# Patient Record
Sex: Female | Born: 1985 | Race: Black or African American | Hispanic: No | Marital: Married | Smoking: Former smoker
Health system: Southern US, Community
[De-identification: ages and names within clinical notes are randomized; demographics above are authoritative.]

## PROBLEM LIST (undated history)

## (undated) ENCOUNTER — Inpatient Hospital Stay (HOSPITAL_COMMUNITY): Payer: Self-pay

## (undated) DIAGNOSIS — Z789 Other specified health status: Secondary | ICD-10-CM

## (undated) DIAGNOSIS — O139 Gestational [pregnancy-induced] hypertension without significant proteinuria, unspecified trimester: Secondary | ICD-10-CM

## (undated) DIAGNOSIS — Z8719 Personal history of other diseases of the digestive system: Secondary | ICD-10-CM

## (undated) DIAGNOSIS — B999 Unspecified infectious disease: Secondary | ICD-10-CM

## (undated) DIAGNOSIS — O24419 Gestational diabetes mellitus in pregnancy, unspecified control: Secondary | ICD-10-CM

## (undated) DIAGNOSIS — R81 Glycosuria: Secondary | ICD-10-CM

## (undated) DIAGNOSIS — D649 Anemia, unspecified: Secondary | ICD-10-CM

## (undated) DIAGNOSIS — G629 Polyneuropathy, unspecified: Secondary | ICD-10-CM

## (undated) DIAGNOSIS — E119 Type 2 diabetes mellitus without complications: Secondary | ICD-10-CM

## (undated) HISTORY — DX: Gestational (pregnancy-induced) hypertension without significant proteinuria, unspecified trimester: O13.9

## (undated) HISTORY — DX: Polyneuropathy, unspecified: G62.9

## (undated) HISTORY — PX: NO PAST SURGERIES: SHX2092

## (undated) HISTORY — DX: Personal history of other diseases of the digestive system: Z87.19

---
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
## 2011-01-31 ENCOUNTER — Emergency Department (HOSPITAL_COMMUNITY): Payer: Self-pay

## 2011-01-31 ENCOUNTER — Emergency Department (HOSPITAL_COMMUNITY)
Admission: EM | Admit: 2011-01-31 | Discharge: 2011-01-31 | Disposition: A | Discharge status: Home / Self Care | Payer: Self-pay | Attending: Emergency Medicine | Admitting: Emergency Medicine

## 2011-01-31 DIAGNOSIS — R062 Wheezing: Secondary | ICD-10-CM | POA: Insufficient documentation

## 2011-01-31 DIAGNOSIS — R490 Dysphonia: Secondary | ICD-10-CM | POA: Insufficient documentation

## 2011-01-31 DIAGNOSIS — J40 Bronchitis, not specified as acute or chronic: Secondary | ICD-10-CM | POA: Insufficient documentation

## 2011-01-31 DIAGNOSIS — R0789 Other chest pain: Secondary | ICD-10-CM | POA: Insufficient documentation

## 2011-01-31 DIAGNOSIS — R070 Pain in throat: Secondary | ICD-10-CM | POA: Insufficient documentation

## 2011-01-31 DIAGNOSIS — R05 Cough: Secondary | ICD-10-CM | POA: Insufficient documentation

## 2011-01-31 DIAGNOSIS — F17200 Nicotine dependence, unspecified, uncomplicated: Secondary | ICD-10-CM | POA: Insufficient documentation

## 2011-01-31 DIAGNOSIS — R059 Cough, unspecified: Secondary | ICD-10-CM | POA: Insufficient documentation

## 2011-02-10 ENCOUNTER — Emergency Department (HOSPITAL_COMMUNITY)
Admission: EM | Admit: 2011-02-10 | Discharge: 2011-02-11 | Disposition: A | Discharge status: Home / Self Care | Payer: Self-pay | Attending: Emergency Medicine | Admitting: Emergency Medicine

## 2011-02-10 DIAGNOSIS — L2989 Other pruritus: Secondary | ICD-10-CM | POA: Insufficient documentation

## 2011-02-10 DIAGNOSIS — B86 Scabies: Secondary | ICD-10-CM | POA: Insufficient documentation

## 2011-02-10 DIAGNOSIS — R21 Rash and other nonspecific skin eruption: Secondary | ICD-10-CM | POA: Insufficient documentation

## 2011-02-10 DIAGNOSIS — L298 Other pruritus: Secondary | ICD-10-CM | POA: Insufficient documentation

## 2011-06-20 ENCOUNTER — Emergency Department (HOSPITAL_COMMUNITY)
Admission: EM | Admit: 2011-06-20 | Discharge: 2011-06-20 | Disposition: A | Discharge status: Home / Self Care | Payer: Self-pay | Attending: Emergency Medicine | Admitting: Emergency Medicine

## 2011-06-20 DIAGNOSIS — H53149 Visual discomfort, unspecified: Secondary | ICD-10-CM | POA: Insufficient documentation

## 2011-06-20 DIAGNOSIS — R51 Headache: Secondary | ICD-10-CM | POA: Insufficient documentation

## 2011-07-05 ENCOUNTER — Ambulatory Visit (INDEPENDENT_AMBULATORY_CARE_PROVIDER_SITE_OTHER): Payer: Self-pay

## 2011-07-05 ENCOUNTER — Inpatient Hospital Stay (INDEPENDENT_AMBULATORY_CARE_PROVIDER_SITE_OTHER)
Admission: RE | Admit: 2011-07-05 | Discharge: 2011-07-05 | Disposition: A | Discharge status: Home / Self Care | Payer: Self-pay | Source: Ambulatory Visit | Attending: Family Medicine | Admitting: Family Medicine

## 2011-07-05 DIAGNOSIS — M940 Chondrocostal junction syndrome [Tietze]: Secondary | ICD-10-CM

## 2011-09-16 ENCOUNTER — Other Ambulatory Visit: Payer: Self-pay

## 2011-09-16 ENCOUNTER — Encounter (HOSPITAL_COMMUNITY): Payer: Self-pay | Admitting: Emergency Medicine

## 2011-09-16 ENCOUNTER — Emergency Department (HOSPITAL_COMMUNITY)
Admission: EM | Admit: 2011-09-16 | Discharge: 2011-09-16 | Disposition: A | Discharge status: Home / Self Care | Payer: Self-pay | Attending: Emergency Medicine | Admitting: Emergency Medicine

## 2011-09-16 DIAGNOSIS — R42 Dizziness and giddiness: Secondary | ICD-10-CM | POA: Insufficient documentation

## 2011-09-16 DIAGNOSIS — R232 Flushing: Secondary | ICD-10-CM | POA: Insufficient documentation

## 2011-09-16 DIAGNOSIS — R5381 Other malaise: Secondary | ICD-10-CM | POA: Insufficient documentation

## 2011-09-16 DIAGNOSIS — F17200 Nicotine dependence, unspecified, uncomplicated: Secondary | ICD-10-CM | POA: Insufficient documentation

## 2011-09-16 DIAGNOSIS — R55 Syncope and collapse: Secondary | ICD-10-CM

## 2011-09-16 LAB — BASIC METABOLIC PANEL
Calcium: 9.5 mg/dL (ref 8.4–10.5)
Creatinine, Ser: 0.71 mg/dL (ref 0.50–1.10)
GFR calc non Af Amer: >90 mL/min (ref >90)
Glucose, Bld: 116 mg/dL — ABNORMAL HIGH (ref 70–99)
Sodium: 135 mEq/L (ref 135–145)

## 2011-09-16 LAB — BASIC METABOLIC PANEL WITH GFR
BUN: 11 mg/dL (ref 6–23)
CO2: 23 meq/L (ref 19–32)
Chloride: 101 meq/L (ref 96–112)
GFR calc Af Amer: >90 mL/min (ref >90)
Potassium: 3.5 meq/L (ref 3.5–5.1)

## 2011-09-16 LAB — CBC
HCT: 36.1 % (ref 36.0–46.0)
Hemoglobin: 12.1 g/dL (ref 12.0–15.0)
MCH: 26.5 pg (ref 26.0–34.0)
MCHC: 33.5 g/dL (ref 30.0–36.0)
MCV: 79 fL (ref 78.0–100.0)
Platelets: 236 10*3/uL (ref 150–400)
RBC: 4.57 MIL/uL (ref 3.87–5.11)
RDW: 13.6 % (ref 11.5–15.5)
WBC: 6.1 K/uL (ref 4.0–10.5)

## 2011-09-16 LAB — DIFFERENTIAL
Basophils Absolute: 0 10*3/uL (ref 0.0–0.1)
Basophils Relative: 1 % (ref 0–1)
Eosinophils Absolute: 0.1 10*3/uL (ref 0.0–0.7)
Eosinophils Relative: 1 % (ref 0–5)
Lymphocytes Relative: 38 % (ref 12–46)
Lymphs Abs: 2.3 10*3/uL (ref 0.7–4.0)
Monocytes Absolute: 0.6 K/uL (ref 0.1–1.0)
Monocytes Relative: 9 % (ref 3–12)
Neutro Abs: 3.1 K/uL (ref 1.7–7.7)
Neutrophils Relative %: 51 % (ref 43–77)

## 2011-09-16 LAB — POCT PREGNANCY, URINE: Preg Test, Ur: NEGATIVE

## 2011-09-16 NOTE — ED Provider Notes (Signed)
History     CSN: [id number]  Arrival date & time 09/16/11  1543   First MD Initiated Contact with Patient 09/16/11 1754      Chief Complaint  Patient presents with  . Dizziness    (Consider location/radiation/quality/duration/timing/severity/associated sxs/prior treatment) HPI Comments: Patient reports that she was with 2 of her friends had been drinking beer last night and around 1 PM she felt somewhat dizzy he felt flushed and hot. She reports that she wanted to go outside to get some fresh air and after standing and taking one or 2 steps she lost consciousness and fell to the ground. She reports she was not unconscious for a long period of time, no oral trauma and no seizure-like activity. She did not have any preceding shortness of breath or chest pain. She did not have any back pain or abdominal pain. Since then today, she has not had any episodes of chest pain, palpitations or shortness of breath. She has felt somewhat fatigued and tired and reports some mild dizziness. Dizziness does not imply vertigo or spinning sensation according to the patient. She reports no family history of sudden death, fainting or cardiac arrhythmias. She reports that today she is simply rest and drink lots of fluid. She comes here for further evaluation.  The history is provided by the patient.    History reviewed. No pertinent past medical history.  History reviewed. No pertinent past surgical history.  History reviewed. No pertinent family history.  History  Substance Use Topics  . Smoking status: Current Everyday Smoker  . Smokeless tobacco: Not on file  . Alcohol Use: No    OB History    Grav Para Term Preterm Abortions TAB SAB Ect Mult Living                  Review of Systems  Constitutional: Negative.   Respiratory: Negative for cough, chest tightness and shortness of breath.   Cardiovascular: Negative for chest pain and leg swelling.  Gastrointestinal: Negative for abdominal pain,  diarrhea and blood in stool.  Musculoskeletal: Negative for back pain.  Neurological: Positive for dizziness and light-headedness. Negative for weakness, numbness and headaches.    Allergies  Review of patient's allergies indicates no known allergies.  Home Medications  No current outpatient prescriptions on file.  BP 108/70  Pulse 60  Temp(Src) 98.4 F (36.9 C) (Oral)  Resp 16  SpO2 100%  LMP 08/15/2011  Physical Exam  Nursing note and vitals reviewed. Constitutional: She appears well-developed and well-nourished.  HENT:  Head: Normocephalic and atraumatic.  Mouth/Throat: Mucous membranes are normal. Mucous membranes are not pale and not dry.  Eyes: Conjunctivae are normal. Pupils are equal, round, and reactive to light. No scleral icterus.  Neck: Normal range of motion. Neck supple.  Cardiovascular: Normal rate and regular rhythm.   Pulmonary/Chest: Effort normal. No respiratory distress.  Abdominal: Soft.  Skin: Skin is warm and dry. No rash noted.    ED Course  Procedures (including critical care time)  Labs Reviewed  BASIC METABOLIC PANEL - Abnormal; Notable for the following:    Glucose, Bld 116 (*)    All other components within normal limits  CBC  DIFFERENTIAL  POCT PREGNANCY, URINE  POCT PREGNANCY, URINE   No results found.   1. Vasovagal syncope     EKG at time 19:18 shows a normal sinus rhythm at rate of 61. Normal intervals, normal axis and no ST or T wave abnormalities are seen. ECG is similar  in appearance to one performed on 01/31/2011.  MDM  Vital signs are normal with room air saturations being 100% which is normal. Patient's examination here is unremarkable. There no family risk factors to suggest sudden cardiac event. Her last menstrual cycle was approximately one month ago. Plan is to obtain basic blood count, urine pregnancy and an EKG. Otherwise clinically and by history, I feel that the patient simply had a vasovagal  event.       Gavin Pound. Oletta Lamas, MD 09/16/11 5621

## 2011-09-16 NOTE — ED Notes (Signed)
PT. REPORTS DIZZINESS AND FAINTED LAST NIGHT AT HOME ,  STATES GENERALIZED WEAKNESS AND HEADACHE .

## 2011-09-16 NOTE — Discharge Instructions (Signed)
Syncope You have had a fainting (syncopal) spell. A fainting episode is a sudden, short-lived loss of consciousness. It results in complete recovery. It occurs because there has been a temporary shortage of oxygen and/or sugar (glucose) to the brain. CAUSES   Blood pressure pills and other medications that may lower blood pressure below normal. Sudden changes in posture (sudden standing).   Over-medication. Take your medications as directed.   Standing too long. This can cause blood to pool in the legs.   Seizure disorders.   Low blood sugar (hypoglycemia) of diabetes. This more commonly causes coma.   Bearing down to go to the bathroom. This can cause your blood pressure to rise suddenly. Your body compensates by making the blood pressure too low when you stop bearing down.   Hardening of the arteries where the brain temporarily does not receive enough blood.   Irregular heart beat and circulatory problems.   Fear, emotional distress, injury, sight of blood, or illness.  Your caregiver will send you home if the syncope was from non-worrisome causes (benign). Depending on your age and health, you may stay to be monitored and observed. If you return home, have someone stay with you if your caregiver feels that is desirable. It is very important to keep all follow-up referrals and appointments in order to properly manage this condition. This is a serious problem which can lead to serious illness and death if not carefully managed.  WARNING: Do not drive or operate machinery until your caregiver feels that it is safe for you to do so. SEEK IMMEDIATE MEDICAL CARE IF:   You have another fainting episode or faint while lying or sitting down. DO NOT DRIVE YOURSELF. Call 911 if no other help is available.   You have chest pain, are feeling sick to your stomach (nausea), vomiting or abdominal pain.   You have an irregular heartbeat or one that is very fast (pulse over 120 beats per minute).    You have a loss of feeling in some part of your body or lose movement in your arms or legs.   You have difficulty with speech, confusion, severe weakness, or visual problems.   You become sweaty and/or feel light headed.  Make sure you are rechecked as instructed. Document Released: 08/19/2005 Document Revised: 05/01/2011 Document Reviewed: 04/09/2007 [employer] Patient Information 2012 [employer]. 

## 2012-04-01 ENCOUNTER — Emergency Department (HOSPITAL_COMMUNITY): Payer: Self-pay

## 2012-04-01 ENCOUNTER — Emergency Department (HOSPITAL_COMMUNITY)
Admission: EM | Admit: 2012-04-01 | Discharge: 2012-04-01 | Disposition: A | Discharge status: Home / Self Care | Payer: Self-pay | Attending: Emergency Medicine | Admitting: Emergency Medicine

## 2012-04-01 ENCOUNTER — Encounter (HOSPITAL_COMMUNITY): Payer: Self-pay | Admitting: *Deleted

## 2012-04-01 DIAGNOSIS — R0602 Shortness of breath: Secondary | ICD-10-CM | POA: Insufficient documentation

## 2012-04-01 DIAGNOSIS — R071 Chest pain on breathing: Secondary | ICD-10-CM | POA: Insufficient documentation

## 2012-04-01 DIAGNOSIS — R079 Chest pain, unspecified: Secondary | ICD-10-CM | POA: Insufficient documentation

## 2012-04-01 LAB — CBC WITH DIFFERENTIAL/PLATELET
Basophils Absolute: 0 10*3/uL (ref 0.0–0.1)
Basophils Relative: 0 % (ref 0–1)
Eosinophils Absolute: 0.1 10*3/uL (ref 0.0–0.7)
Eosinophils Relative: 1 % (ref 0–5)
Lymphocytes Relative: 32 % (ref 12–46)
MCHC: 32.1 g/dL (ref 30.0–36.0)
MCV: 81.5 fL (ref 78.0–100.0)
Platelets: 238 10*3/uL (ref 150–400)
RDW: 13.8 % (ref 11.5–15.5)
WBC: 6.8 10*3/uL (ref 4.0–10.5)

## 2012-04-01 LAB — BASIC METABOLIC PANEL
CO2: 25 mEq/L (ref 19–32)
Calcium: 10.2 mg/dL (ref 8.4–10.5)
Creatinine, Ser: 0.65 mg/dL (ref 0.50–1.10)
GFR calc Af Amer: >90 mL/min (ref >90)
GFR calc non Af Amer: >90 mL/min (ref >90)
Sodium: 137 mEq/L (ref 135–145)

## 2012-04-01 LAB — PREGNANCY, URINE: Preg Test, Ur: NEGATIVE

## 2012-04-01 LAB — TROPONIN I: Troponin I: <0.3 ng/mL (ref <0.30)

## 2012-04-01 MED ORDER — ALBUTEROL SULFATE (5 MG/ML) 0.5% IN NEBU
5.0000 mg | INHALATION_SOLUTION | RESPIRATORY_TRACT | Status: AC
  Administered 2012-04-01: 5 mg via RESPIRATORY_TRACT
  Filled 2012-04-01: qty 1

## 2012-04-01 MED ORDER — IPRATROPIUM BROMIDE 0.02 % IN SOLN
0.5000 mg | Freq: Once | RESPIRATORY_TRACT | Status: AC
  Administered 2012-04-01: 0.5 mg via RESPIRATORY_TRACT
  Filled 2012-04-01: qty 2.5

## 2012-04-01 MED ORDER — SODIUM CHLORIDE 0.9 % IV BOLUS (SEPSIS)
1000.0000 mL | Freq: Once | INTRAVENOUS | Status: AC
  Administered 2012-04-01: 1000 mL via INTRAVENOUS

## 2012-04-01 MED ORDER — IBUPROFEN 400 MG PO TABS: 400.0000 mg | ORAL_TABLET | Freq: Four times a day (QID) | ORAL | Status: AC | PRN

## 2012-04-01 MED ORDER — KETOROLAC TROMETHAMINE 30 MG/ML IJ SOLN
30.0000 mg | Freq: Once | INTRAMUSCULAR | Status: AC
  Administered 2012-04-01: 30 mg via INTRAVENOUS
  Filled 2012-04-01: qty 1

## 2012-04-01 NOTE — ED Notes (Signed)
Reports mid chest pain that started this am, denies injury, reports mild cough today. No sob. No acute distress noted at triage, ekg being done.

## 2012-04-01 NOTE — ED Provider Notes (Addendum)
History     CSN: [id number]  Arrival date & time 04/01/12  1620   First MD Initiated Contact with Patient 04/01/12 1810      Chief Complaint  Patient presents with  . Chest Pain    (Consider location/radiation/quality/duration/timing/severity/associated sxs/prior treatment) HPI Comments: Pt with hx of bronchitis comes in with cc of chest pain. Pt states that she woke up with a mid-sternal chest pain that is constant, and worse with inspiration. The pain is severe, and there is associated cough that is non productive. Pt has no n/v/f/c. She denies any worsening of pain with exertion. Pt denies any illicits use, but does smoke cocaine. LMP was 2 days ago.  Patient is a 26 y.o. female presenting with chest pain. The history is provided by the patient.  Chest Pain Primary symptoms include shortness of breath. Pertinent negatives for primary symptoms include no abdominal pain, no nausea and no vomiting.     History reviewed. No pertinent past medical history.  History reviewed. No pertinent past surgical history.  History reviewed. No pertinent family history.  History  Substance Use Topics  . Smoking status: Current Everyday Smoker  . Smokeless tobacco: Not on file  . Alcohol Use: No    OB History    Grav Para Term Preterm Abortions TAB SAB Ect Mult Living                  Review of Systems  Constitutional: Positive for activity change.  HENT: Negative for neck pain.   Respiratory: Positive for shortness of breath.   Cardiovascular: Positive for chest pain.  Gastrointestinal: Negative for nausea, vomiting and abdominal pain.  Genitourinary: Negative for dysuria.  Neurological: Negative for headaches.    Allergies  Review of patient's allergies indicates no known allergies.  Home Medications   Current Outpatient Rx  Name Route Sig Dispense Refill  . IBUPROFEN 200 MG PO TABS Oral Take 400 mg by mouth every 6 (six) hours as needed. For pain      BP 120/73   Pulse 79  Temp 98.3 F (36.8 C) (Oral)  Resp 18  SpO2 100%  LMP 03/25/2012  Physical Exam  Constitutional: She is oriented to person, place, and time. She appears well-developed and well-nourished.  HENT:  Head: Normocephalic and atraumatic.  Eyes: EOM are normal. Pupils are equal, round, and reactive to light.  Neck: Neck supple.  Cardiovascular: Normal rate, regular rhythm and normal heart sounds.   No murmur heard. Pulmonary/Chest: Effort normal and breath sounds normal. No respiratory distress. She has no wheezes. She has no rales. She exhibits tenderness.  Abdominal: Soft. She exhibits no distension. There is no tenderness. There is no rebound and no guarding.  Neurological: She is alert and oriented to person, place, and time.  Skin: Skin is warm and dry.    ED Course  Procedures (including critical care time)   Labs Reviewed  CBC WITH DIFFERENTIAL  BASIC METABOLIC PANEL  TROPONIN I  PREGNANCY, URINE   Dg Chest 2 View  04/01/2012  *RADIOLOGY REPORT*  Clinical Data: Chest pain  CHEST - 2 VIEW  Comparison:  07/05/2011  Findings:  The heart size and mediastinal contours are within normal limits.  Both lungs are clear.  The visualized skeletal structures are unremarkable.  IMPRESSION: No active cardiopulmonary disease.  Original Report Authenticated By: Judie Petit. Ruel Favors, M.D.     No diagnosis found.    MDM   Date: 04/01/2012  Rate: 77  Rhythm: normal sinus  rhythm  QRS Axis: normal  Intervals: normal  ST/T Wave abnormalities: nonspecific T wave changes  Conduction Disutrbances:none  Narrative Interpretation:   Old EKG Reviewed: unchanged  Differential diagnosis includes: ACS syndrome Valvular disorder Myocarditis Pericarditis Pericardial effusion Pneumonia/bronchitis Pleural effusion Pulmonary edema PE Anemia Musculoskeletal pain  A/P: Pt with no cardiac hx, and only risk factor being smoking comes in with cc of pleuritic chest pain that started this  am. She has hx of bronchitis, but the lung exam is largely normal. Pt's EKG is unchanged and CXR is WNL. Sincethe pain is pleuritic, there is associated sob and cough - we will get d-dimer for this patient. Her WELLS score for PE is negative, and she would have been PERCs negative as well.   10:18 PM All labs normal, CXR normal, no abnormal lung exam, so we will not treat as bronchitis. Likely chest wall pain.      Derwood Kaplan, MD 04/01/12 1901  Derwood Kaplan, MD 04/01/12 2218

## 2012-04-28 ENCOUNTER — Emergency Department (HOSPITAL_COMMUNITY)
Admission: EM | Admit: 2012-04-28 | Discharge: 2012-04-28 | Disposition: A | Discharge status: Home / Self Care | Payer: Self-pay | Attending: Emergency Medicine | Admitting: Emergency Medicine

## 2012-04-28 ENCOUNTER — Encounter (HOSPITAL_COMMUNITY): Payer: Self-pay | Admitting: Emergency Medicine

## 2012-04-28 DIAGNOSIS — R209 Unspecified disturbances of skin sensation: Secondary | ICD-10-CM | POA: Insufficient documentation

## 2012-04-28 DIAGNOSIS — F17200 Nicotine dependence, unspecified, uncomplicated: Secondary | ICD-10-CM | POA: Insufficient documentation

## 2012-04-28 DIAGNOSIS — R200 Anesthesia of skin: Secondary | ICD-10-CM

## 2012-04-28 HISTORY — DX: Glycosuria: R81

## 2012-04-28 MED ORDER — GABAPENTIN 100 MG PO CAPS: 100.0000 mg | ORAL_CAPSULE | Freq: Three times a day (TID) | ORAL | Status: DC

## 2012-04-28 NOTE — ED Notes (Signed)
Two days ago top of right leg started going numb,  Started as a little patch on front of leg, now is entire anterior portion of thigh. No pain, just numb

## 2012-04-28 NOTE — ED Provider Notes (Signed)
History     CSN: [id number]  Arrival date & time 04/28/12  1533   First MD Initiated Contact with Patient 04/28/12 1652      Chief Complaint  Patient presents with  . leg numb     (Consider location/radiation/quality/duration/timing/severity/associated sxs/prior treatment) HPI Comments: Pt w no significant PMHx presents to the ER for right thigh numbness x 2 days. Pt denies any back pain, loss control of bowel or bladder, trauma/injury, Fever. NS, Chills, rash, insect bite, HA, myalgias or nuchal rigidity. There is no pain, weakness, inability to ambulate or footdrop. No hx of CA.   The history is provided by the patient.    Past Medical History  Diagnosis Date  . Glucose found in urine on examination     No past surgical history on file.  No family history on file.  History  Substance Use Topics  . Smoking status: Current Everyday Smoker -- 0.5 packs/day    Types: Cigarettes  . Smokeless tobacco: Not on file  . Alcohol Use: Yes     occasionally    OB History    Grav Para Term Preterm Abortions TAB SAB Ect Mult Living                  Review of Systems  Constitutional: Negative for fever, chills and appetite change.  HENT: Negative for congestion.   Eyes: Negative for visual disturbance.  Respiratory: Negative for shortness of breath.   Cardiovascular: Negative for chest pain and leg swelling.  Gastrointestinal: Negative for abdominal pain.  Genitourinary: Negative for dysuria, urgency and frequency.  Neurological: Positive for numbness. Negative for dizziness, tremors, seizures, syncope, speech difficulty, weakness, light-headedness and headaches.  Psychiatric/Behavioral: Negative for confusion.  All other systems reviewed and are negative.    Allergies  Review of patient's allergies indicates no known allergies.  Home Medications   Current Outpatient Rx  Name Route Sig Dispense Refill  . IBUPROFEN 200 MG PO TABS Oral Take 800 mg by mouth every 8  (eight) hours as needed. For pain      BP 119/74  Pulse 67  Temp 98.6 F (37 C) (Oral)  Resp 16  SpO2 100%  LMP 04/20/2012  Physical Exam  Nursing note and vitals reviewed. Constitutional: She is oriented to person, place, and time. She appears well-developed and well-nourished. No distress.  HENT:  Head: Normocephalic and atraumatic.  Eyes: Conjunctivae and EOM are normal.  Neck: Normal range of motion.  Pulmonary/Chest: Effort normal.  Musculoskeletal: Normal range of motion.  Neurological: She is alert and oriented to person, place, and time.       Decreased light touch sensation of right lateral and anterior thigh along L3-L4 dermatomes. Intact strength, hot/cold & sharp dull sensation.   Skin: Skin is warm and dry. No rash noted. She is not diaphoretic.       Skin intact, no rash or erythema  Psychiatric: She has a normal mood and affect. Her behavior is normal.    ED Course  Procedures (including critical care time)  Labs Reviewed - No data to display No results found.   No diagnosis found.    MDM  Pt is young health 26 yo F that presents w thigh numbness x 2 days. Discussed w [facility] who agrees to [employer] w plan to f-u w PCP and return if symptoms worsen. Non-concerning pain free PE w no weakness of lower extremities. Return precautions discussed.        Jaci Carrel, [place] 04/28/12  1751 

## 2012-04-29 NOTE — ED Provider Notes (Signed)
Medical screening examination/treatment/procedure(s) were performed by non-physician practitioner and as supervising physician I was immediately available for consultation/collaboration.  Tanzania Basham R. Rebel Laughridge, MD 04/29/12 0007 

## 2012-05-07 ENCOUNTER — Encounter (HOSPITAL_COMMUNITY): Payer: Self-pay | Admitting: *Deleted

## 2012-05-07 ENCOUNTER — Emergency Department (HOSPITAL_COMMUNITY)
Admission: EM | Admit: 2012-05-07 | Discharge: 2012-05-07 | Disposition: A | Discharge status: Home / Self Care | Payer: Self-pay | Attending: Emergency Medicine | Admitting: Emergency Medicine

## 2012-05-07 DIAGNOSIS — R112 Nausea with vomiting, unspecified: Secondary | ICD-10-CM | POA: Insufficient documentation

## 2012-05-07 DIAGNOSIS — F17200 Nicotine dependence, unspecified, uncomplicated: Secondary | ICD-10-CM | POA: Insufficient documentation

## 2012-05-07 LAB — CBC WITH DIFFERENTIAL/PLATELET
Basophils Absolute: 0.1 10*3/uL (ref 0.0–0.1)
Basophils Relative: 1 % (ref 0–1)
Eosinophils Relative: 1 % (ref 0–5)
HCT: 34.8 % — ABNORMAL LOW (ref 36.0–46.0)
Hemoglobin: 11.5 g/dL — ABNORMAL LOW (ref 12.0–15.0)
MCHC: 33 g/dL (ref 30.0–36.0)
MCV: 80.2 fL (ref 78.0–100.0)
Monocytes Absolute: 0.4 10*3/uL (ref 0.1–1.0)
Monocytes Relative: 7 % (ref 3–12)
RDW: 13.8 % (ref 11.5–15.5)

## 2012-05-07 LAB — COMPREHENSIVE METABOLIC PANEL
AST: 18 U/L (ref 0–37)
Albumin: 4.2 g/dL (ref 3.5–5.2)
BUN: 11 mg/dL (ref 6–23)
CO2: 25 mEq/L (ref 19–32)
Calcium: 9.7 mg/dL (ref 8.4–10.5)
Creatinine, Ser: 0.69 mg/dL (ref 0.50–1.10)
GFR calc non Af Amer: >90 mL/min (ref >90)
Total Bilirubin: 0.6 mg/dL (ref 0.3–1.2)

## 2012-05-07 LAB — URINALYSIS, ROUTINE W REFLEX MICROSCOPIC
Hgb urine dipstick: NEGATIVE
Leukocytes, UA: NEGATIVE
Protein, ur: NEGATIVE mg/dL
Specific Gravity, Urine: 1.026 (ref 1.005–1.030)
Urobilinogen, UA: 1 mg/dL (ref 0.0–1.0)

## 2012-05-07 MED ORDER — ONDANSETRON HCL 8 MG PO TABS: 8.0000 mg | ORAL_TABLET | Freq: Three times a day (TID) | ORAL | Status: AC | PRN

## 2012-05-07 MED ORDER — ONDANSETRON 8 MG PO TBDP
8.0000 mg | ORAL_TABLET | Freq: Once | ORAL | Status: AC
  Administered 2012-05-07: 8 mg via ORAL
  Filled 2012-05-07: qty 1

## 2012-05-07 NOTE — ED Provider Notes (Addendum)
History     CSN: [id number]  Arrival date & time 05/07/12  1249   First MD Initiated Contact with Patient 05/07/12 1519      Chief Complaint  Patient presents with  . Nausea  . Emesis  . Dizziness    (Consider location/radiation/quality/duration/timing/severity/associated sxs/prior treatment) HPI Comments: Melanie Strong is a 26 y.o. Female who presents for evaluation of nausea, vomiting, and dizziness. She has been ill for 24 hours. She denies blood in the emesis. There's been no abdominal pain, or diarrhea. There are no modifying factors.  The history is provided by the patient.    Past Medical History  Diagnosis Date  . Glucose found in urine on examination     History reviewed. No pertinent past surgical history.  No family history on file.  History  Substance Use Topics  . Smoking status: Current Everyday Smoker -- 0.5 packs/day    Types: Cigarettes  . Smokeless tobacco: Not on file  . Alcohol Use: Yes     occasionally    OB History    Grav Para Term Preterm Abortions TAB SAB Ect Mult Living                  Review of Systems  All other systems reviewed and are negative.    Allergies  Review of patient's allergies indicates no known allergies.  Home Medications   Current Outpatient Rx  Name Route Sig Dispense Refill  . GABAPENTIN 100 MG PO CAPS Oral Take 1 capsule (100 mg total) by mouth 3 (three) times daily. 30 capsule 0  . IBUPROFEN 200 MG PO TABS Oral Take 800 mg by mouth every 8 (eight) hours as needed. For pain    . ONDANSETRON HCL 8 MG PO TABS Oral Take 1 tablet (8 mg total) by mouth every 8 (eight) hours as needed for nausea. 20 tablet 0    BP 142/85  Pulse 81  Temp 99.1 F (37.3 C) (Oral)  Resp 14  SpO2 100%  LMP 04/23/2012  Physical Exam  Nursing note and vitals reviewed. Constitutional: She is oriented to person, place, and time. She appears well-developed and well-nourished.  HENT:  Head: Normocephalic and atraumatic.    Eyes: Conjunctivae normal and EOM are normal. Pupils are equal, round, and reactive to light.  Neck: Normal range of motion and phonation normal. Neck supple.  Cardiovascular: Normal rate, regular rhythm and intact distal pulses.   Pulmonary/Chest: Effort normal and breath sounds normal. She exhibits no tenderness.  Abdominal: Soft. She exhibits no distension. There is no tenderness. There is no guarding.  Musculoskeletal: Normal range of motion.  Neurological: She is alert and oriented to person, place, and time. She has normal strength. She exhibits normal muscle tone.  Skin: Skin is warm and dry.  Psychiatric: She has a normal mood and affect. Her behavior is normal. Judgment and thought content normal.    ED Course  Procedures (including critical care time)   Treated with oral, Zofran. Reeval: 1715 she feels better  Labs Reviewed  CBC WITH DIFFERENTIAL - Abnormal; Notable for the following:    Hemoglobin 11.5 (*)     HCT 34.8 (*)     Neutrophils Relative 41 (*)     Lymphocytes Relative 50 (*)     All other components within normal limits  COMPREHENSIVE METABOLIC PANEL  URINALYSIS, ROUTINE W REFLEX MICROSCOPIC  POCT PREGNANCY, URINE  LAB REPORT - SCANNED      1. Nausea & vomiting  MDM  Nonspecific, nausea, and vomiting with rapid improvement. Doubt metabolic instability, serious bacterial infection or impending vascular collapse; the patient is stable for discharge.   Plan: Home Medications- Zofran; Home Treatments- gradually advance diet; Recommended follow up- PCP of choice prn        Flint Melter, MD 05/08/12 2352  Flint Melter, MD 06/19/12 [phone]

## 2012-05-07 NOTE — ED Notes (Signed)
Pt reports n/v, dizziness for past 24 hrs. Emesis x7. LMP 2 wks ago.

## 2012-10-08 ENCOUNTER — Inpatient Hospital Stay (HOSPITAL_COMMUNITY)
Admission: AD | Admit: 2012-10-08 | Discharge: 2012-10-08 | Disposition: A | Discharge status: Home / Self Care | Payer: Self-pay | Source: Ambulatory Visit | Attending: Obstetrics & Gynecology | Admitting: Obstetrics & Gynecology

## 2012-10-08 ENCOUNTER — Encounter (HOSPITAL_COMMUNITY): Payer: Self-pay | Admitting: *Deleted

## 2012-10-08 DIAGNOSIS — N938 Other specified abnormal uterine and vaginal bleeding: Secondary | ICD-10-CM | POA: Insufficient documentation

## 2012-10-08 DIAGNOSIS — N949 Unspecified condition associated with female genital organs and menstrual cycle: Secondary | ICD-10-CM

## 2012-10-08 DIAGNOSIS — N921 Excessive and frequent menstruation with irregular cycle: Secondary | ICD-10-CM

## 2012-10-08 DIAGNOSIS — R109 Unspecified abdominal pain: Secondary | ICD-10-CM | POA: Insufficient documentation

## 2012-10-08 HISTORY — DX: Other specified health status: Z78.9

## 2012-10-08 LAB — URINALYSIS, ROUTINE W REFLEX MICROSCOPIC
Bilirubin Urine: NEGATIVE
Glucose, UA: NEGATIVE mg/dL
Hgb urine dipstick: NEGATIVE
Ketones, ur: 15 mg/dL — AB
Specific Gravity, Urine: >1.03 — ABNORMAL HIGH (ref 1.005–1.030)
pH: 6 (ref 5.0–8.0)

## 2012-10-08 LAB — URINE MICROSCOPIC-ADD ON

## 2012-10-08 MED ORDER — OXYCODONE-ACETAMINOPHEN 5-325 MG PO TABS
1.0000 | ORAL_TABLET | Freq: Four times a day (QID) | ORAL | Status: DC | PRN
Start: 2012-10-08 — End: 2012-10-08
  Administered 2012-10-08: 2 via ORAL
  Filled 2012-10-08: qty 2

## 2012-10-08 MED ORDER — MEDROXYPROGESTERONE ACETATE 10 MG PO TABS: 10.0000 mg | ORAL_TABLET | Freq: Every day | ORAL | Status: DC

## 2012-10-08 NOTE — MAU Note (Signed)
Patient states she has been having vaginal bleeding for almost 3 months since she got a Depo Injection. Started having bad abdominal cramping yesterday and heavy bleeding today.

## 2012-10-08 NOTE — MAU Note (Signed)
Due her third Depo shot at the end of Feb; c/o heavy bleeding for past 5 weeks; c/o lower central abdominal pain that started yesterday,she rates pain @ 10; having dark black, moderate bleeding at present;

## 2012-10-08 NOTE — MAU Provider Note (Signed)
History    CSN: [id number]  Arrival date and time: 10/08/12 0950   First Provider Initiated Contact with Patient 10/08/12 1021      Chief Complaint  Patient presents with  . Abdominal Pain  . Vaginal Bleeding   HPI Melanie Strong is a 27 yo G0P0 female who presents to the MAU with complaints of vaginal bleeding x 1 month and 1 week and abdominal pain. Patient reports that her last normal menstrual cycle was at the end of September, right before receiving her first round of the Depo shot. She reports having no period during September, October and November. Her next Depo shot was in December and one month after the shot the patient reports she starting having the vaginal bleeding. She reports that the blood is very dark and is constant throughout the day. She has some days of very heavy flow and some days of only spotting, but the bleeding has been continuous since the end of December.   She reports that she has been on the Depo shot, previous to starting a new round in September, and had no problems with bleeding before. She denies dizziness or lightheadedness. She also complains of sharp lower abdominal pain that is worse this morning. She has taken ibuprofen for the pain with no relief. She denies nausea or vomiting. She denies dysuria or increase frequency. She denies any vaginal discharge. She reports that she is sexually active with a single partner, and has no concern for STIs (reports she was "checked" for STIs last September and had no infections).   She does report that she was sent home from work with a fever last week, and had flu symptoms until the end of the week. No fever this week. Reports she gets the Depo shot from [facility] and has an appointment for February 17th. The provider at [facility] wanted to place her on OCPs for a few months to regulate the bleeding, but the patient wanted another opinion.  OB History    Grav Para Term Preterm Abortions TAB SAB Ect  Mult Living   0               Past Medical History  Diagnosis Date  . Glucose found in urine on examination   . No pertinent past medical history     Past Surgical History  Procedure Date  . No past surgeries     Family History  Problem Relation Age of Onset  . Other Neg Hx     History  Substance Use Topics  . Smoking status: Current Every Day Smoker -- 0.5 packs/day    Types: Cigarettes  . Smokeless tobacco: Not on file  . Alcohol Use: No     Comment: occasionally    Allergies: No Known Allergies  Prescriptions prior to admission  Medication Sig Dispense Refill  . ibuprofen (ADVIL,MOTRIN) 200 MG tablet Take 800 mg by mouth every 8 (eight) hours as needed. For pain        Review of Systems  Constitutional: Negative for fever.  Gastrointestinal: Positive for abdominal pain. Negative for nausea and vomiting.  Genitourinary: Negative for dysuria and frequency.  Musculoskeletal: Positive for back pain.  Neurological: Positive for headaches. Negative for dizziness and weakness.   Physical Exam   Blood pressure 138/76, pulse 88, temperature 98.6 F (37 C), temperature source Oral, resp. rate 20, height 5' 6.5" (1.689 m), weight 60.51 kg (133 lb 6.4 oz), last menstrual period 05/15/2012.  Physical Exam Physical Examination: General  appearance - alert, well appearing, and in no distress and oriented to person, place, and time Abdomen - soft, nondistended, no masses or organomegaly tenderness noted below umbilicus, negative McBurneys, negative Murphys Extremities - peripheral pulses normal, no pedal edema, no clubbing or cyanosis Skin - normal coloration and turgor, no rashes, no suspicious skin lesions noted   MAU Course  Procedures  Results for orders placed during the hospital encounter of 10/08/12 (from the past 24 hour(s))  URINALYSIS, ROUTINE W REFLEX MICROSCOPIC     Status: Abnormal   Collection Time   10/08/12  9:55 AM      Component Value Range   Color,  Urine YELLOW  YELLOW   APPearance CLEAR  CLEAR   Specific Gravity, Urine >1.030 (*) 1.005 - 1.030   pH 6.0  5.0 - 8.0   Glucose, UA NEGATIVE  NEGATIVE mg/dL   Hgb urine dipstick NEGATIVE  NEGATIVE   Bilirubin Urine NEGATIVE  NEGATIVE   Ketones, ur 15 (*) NEGATIVE mg/dL   Protein, ur 30 (*) NEGATIVE mg/dL   Urobilinogen, UA 0.2  0.0 - 1.0 mg/dL   Nitrite NEGATIVE  NEGATIVE   Leukocytes, UA NEGATIVE  NEGATIVE  URINE MICROSCOPIC-ADD ON     Status: Abnormal   Collection Time   10/08/12  9:55 AM      Component Value Range   Squamous Epithelial / LPF MANY (*) RARE   WBC, UA 0-2  <3 WBC/hpf   RBC / HPF 0-2  <3 RBC/hpf  POCT PREGNANCY, URINE     Status: Normal   Collection Time   10/08/12 10:06 AM      Component Value Range   Preg Test, Ur NEGATIVE  NEGATIVE    Assessment and Plan  Abnormal bleeding due to Depoprovera  [name] 10/08/2012, 10:33 AM   Evaluation and management procedures were performed by PA student under my supervision/collaboration. Chart reviewed, patient examined by me and I agree with management and plan. C/W Dr. Debroah Loop Provera 10mg  po qd x 30 days. [facility]. Danae Orleans, CNM 10/08/2012 12:18 PM

## 2012-11-20 ENCOUNTER — Emergency Department (HOSPITAL_COMMUNITY)
Admission: EM | Admit: 2012-11-20 | Discharge: 2012-11-20 | Disposition: A | Discharge status: Home / Self Care | Payer: Self-pay | Attending: Emergency Medicine | Admitting: Emergency Medicine

## 2012-11-20 ENCOUNTER — Encounter (HOSPITAL_COMMUNITY): Payer: Self-pay | Admitting: Neurology

## 2012-11-20 DIAGNOSIS — F17200 Nicotine dependence, unspecified, uncomplicated: Secondary | ICD-10-CM | POA: Insufficient documentation

## 2012-11-20 DIAGNOSIS — K047 Periapical abscess without sinus: Secondary | ICD-10-CM | POA: Insufficient documentation

## 2012-11-20 DIAGNOSIS — K089 Disorder of teeth and supporting structures, unspecified: Secondary | ICD-10-CM | POA: Insufficient documentation

## 2012-11-20 DIAGNOSIS — Z79899 Other long term (current) drug therapy: Secondary | ICD-10-CM | POA: Insufficient documentation

## 2012-11-20 MED ORDER — MORPHINE SULFATE 4 MG/ML IJ SOLN
4.0000 mg | Freq: Once | INTRAMUSCULAR | Status: AC
  Administered 2012-11-20: 4 mg via INTRAVENOUS
  Filled 2012-11-20: qty 1

## 2012-11-20 MED ORDER — HYDROCODONE-ACETAMINOPHEN 5-325 MG PO TABS: 1.0000 | ORAL_TABLET | Freq: Four times a day (QID) | ORAL | Status: DC | PRN

## 2012-11-20 MED ORDER — IBUPROFEN 800 MG PO TABS: 800.0000 mg | ORAL_TABLET | Freq: Three times a day (TID) | ORAL | Status: DC | PRN

## 2012-11-20 MED ORDER — PENICILLIN V POTASSIUM 500 MG PO TABS: 500.0000 mg | ORAL_TABLET | Freq: Four times a day (QID) | ORAL | Status: DC

## 2012-11-20 MED ORDER — SODIUM CHLORIDE 0.9 % IV SOLN
3.0000 g | Freq: Once | INTRAVENOUS | Status: AC
  Administered 2012-11-20: 3 g via INTRAVENOUS
  Filled 2012-11-20: qty 3

## 2012-11-20 NOTE — ED Notes (Signed)
States two broken teeth on lower right mouth. Yesterday developed right cheek/jaw pain. Woke up this morning cheek and jaw swollen, very tender to touch. Airway intact. A x 4/ ambulatory

## 2012-11-22 NOTE — ED Provider Notes (Signed)
History     CSN: [id number]  Arrival date & time 11/20/12  6578   First MD Initiated Contact with Patient 11/20/12 0901      Chief Complaint  Patient presents with  . Facial Swelling    (Consider location/radiation/quality/duration/timing/severity/associated sxs/prior treatment) HPI Patient presents emergency department with right sided lower dental pain and swelling.  Patient, states she pain yesterday, but developed swelling over night.  Patient, states the pain is increased today.  Patient denies difficulty swallowing, neck swelling, difficulty breathing, chest pain, fever throat swelling, or vomiting.  Patient, states she did not take anything prior to arrival for her symptoms.  Patient, states, that palpation of the area makes the pain, worse. Past Medical History  Diagnosis Date  . Glucose found in urine on examination   . No pertinent past medical history     Past Surgical History  Procedure Laterality Date  . No past surgeries      Family History  Problem Relation Age of Onset  . Other Neg Hx     History  Substance Use Topics  . Smoking status: Current Every Day Smoker -- 0.50 packs/day    Types: Cigarettes  . Smokeless tobacco: Not on file  . Alcohol Use: Yes     Comment: occasionally    OB History   Grav Para Term Preterm Abortions TAB SAB Ect Mult Living   0               Review of Systems All other systems negative except as documented in the HPI. All pertinent positives and negatives as reviewed in the HPI.  Allergies  Review of patient's allergies indicates no known allergies.  Home Medications   Current Outpatient Rx  Name  Route  Sig  Dispense  Refill  . acetaminophen (TYLENOL) 500 MG tablet   Oral   Take 1,000 mg by mouth every 6 (six) hours as needed for pain.         Marland Kitchen ibuprofen (ADVIL,MOTRIN) 200 MG tablet   Oral   Take 800 mg by mouth every 8 (eight) hours as needed. For pain         . medroxyPROGESTERone (DEPO-PROVERA) 150  MG/ML injection   Intramuscular   Inject 150 mg into the muscle every 3 (three) months.         . medroxyPROGESTERone (PROVERA) 10 MG tablet   Oral   Take 10 mg by mouth daily.         Lorita Officer Triphasic (NORGESTIMATE-ETHINYL ESTRADIOL PO)   Oral   Take 1 tablet by mouth daily.         Marland Kitchen HYDROcodone-acetaminophen (NORCO/VICODIN) 5-325 MG per tablet   Oral   Take 1 tablet by mouth every 6 (six) hours as needed for pain.   15 tablet   0   . ibuprofen (ADVIL,MOTRIN) 800 MG tablet   Oral   Take 1 tablet (800 mg total) by mouth every 8 (eight) hours as needed for pain.   21 tablet   0   . penicillin v potassium (VEETID) 500 MG tablet   Oral   Take 1 tablet (500 mg total) by mouth 4 (four) times daily.   40 tablet   0     BP 120/73  Pulse 94  Temp(Src) 99 F (37.2 C) (Oral)  Resp 24  Ht 5\' 8"  (1.727 m)  Wt 138 lb (62.596 kg)  BMI 20.99 kg/m2  SpO2 99%  LMP 11/18/2012  Physical Exam  Nursing  note and vitals reviewed. Constitutional: She is oriented to person, place, and time. She appears well-developed and well-nourished. No distress.  HENT:  Head: Normocephalic and atraumatic. No trismus in the jaw.  Mouth/Throat: Uvula is midline, oropharynx is clear and moist and mucous membranes are normal. Abnormal dentition. Dental abscesses and dental caries present. No edematous.  Eyes: Pupils are equal, round, and reactive to light.  Neck: Normal range of motion. Neck supple.  Cardiovascular: Normal rate, regular rhythm and normal heart sounds.   Pulmonary/Chest: Effort normal and breath sounds normal. No stridor.  Lymphadenopathy:    She has no cervical adenopathy.  Neurological: She is alert and oriented to person, place, and time.  Skin: Skin is warm and dry.    ED Course  Procedures (including critical care time)  Labs Reviewed - No data to display No results found.   1. Dental abscess    The patient is referred to on call dentistry. She is  given IV antibiotics. She is told to return here as needed. Rinse with warm water and peroxide 3 times per day.    MDM         Carlyle Dolly, PA-C 11/22/12 1102

## 2012-11-23 NOTE — ED Provider Notes (Signed)
Medical screening examination/treatment/procedure(s) were performed by non-physician practitioner and as supervising physician I was immediately available for consultation/collaboration.  Flint Melter, MD 11/23/12 [phone]

## 2013-02-24 ENCOUNTER — Inpatient Hospital Stay (HOSPITAL_COMMUNITY)
Admission: AD | Admit: 2013-02-24 | Discharge: 2013-02-24 | Disposition: A | Discharge status: Home / Self Care | Payer: Self-pay | Source: Ambulatory Visit | Attending: Obstetrics & Gynecology | Admitting: Obstetrics & Gynecology

## 2013-02-24 ENCOUNTER — Encounter (HOSPITAL_COMMUNITY): Payer: Self-pay | Admitting: *Deleted

## 2013-02-24 DIAGNOSIS — Z3202 Encounter for pregnancy test, result negative: Secondary | ICD-10-CM | POA: Insufficient documentation

## 2013-02-24 DIAGNOSIS — R112 Nausea with vomiting, unspecified: Secondary | ICD-10-CM | POA: Insufficient documentation

## 2013-02-24 HISTORY — DX: Unspecified infectious disease: B99.9

## 2013-02-24 LAB — URINALYSIS, ROUTINE W REFLEX MICROSCOPIC
Hgb urine dipstick: NEGATIVE
Leukocytes, UA: NEGATIVE
Nitrite: NEGATIVE
Protein, ur: NEGATIVE mg/dL
Specific Gravity, Urine: 1.02 (ref 1.005–1.030)
Urobilinogen, UA: 2 mg/dL — ABNORMAL HIGH (ref 0.0–1.0)

## 2013-02-24 LAB — POCT PREGNANCY, URINE: Preg Test, Ur: NEGATIVE

## 2013-02-24 MED ORDER — PROMETHAZINE HCL 12.5 MG PO TABS: 12.5000 mg | ORAL_TABLET | Freq: Four times a day (QID) | ORAL | Status: DC | PRN

## 2013-02-24 NOTE — MAU Note (Signed)
Patient is on BCP's.

## 2013-02-24 NOTE — MAU Note (Addendum)
N/v started this morning.  No one at home is sick.  Through up 5 times.  Did not think she was preg, is on birth control.

## 2013-02-24 NOTE — MAU Note (Signed)
Patient states she has had multiple episodes of vomiting this am. Denies pain, bleeding or discharge. No diarrhea.

## 2013-02-24 NOTE — MAU Provider Note (Signed)
History     CSN: [id number]  Arrival date and time: 02/24/13 4098   First Provider Initiated Contact with Patient 02/24/13 1326      Chief Complaint  Patient presents with  . Emesis   HPI Melanie Strong is 27 y.o. presents with nausea and vomiting that began this am.  States they ate take out [place] food last night.   VomitedX 5, last time 9am.  Hasn't eaten since vomiting began.  They sent her home from work. Denies abdominal pain.  Denies fever but has had chills.  Has not missed her period.  Using OCPs for contraception.      Past Medical History  Diagnosis Date  . Glucose found in urine on examination   . No pertinent past medical history   . Infection     UTI    Past Surgical History  Procedure Laterality Date  . No past surgeries      Family History  Problem Relation Age of Onset  . Other Neg Hx   . Hypertension Mother   . Cancer Paternal Aunt   . Diabetes Maternal Grandmother   . Hypertension Maternal Grandmother     History  Substance Use Topics  . Smoking status: Current Every Day Smoker -- 0.50 packs/day for 10 years    Types: Cigarettes  . Smokeless tobacco: Never Used  . Alcohol Use: Yes     Comment: occasionally    Allergies: No Known Allergies  Prescriptions prior to admission  Medication Sig Dispense Refill  . ibuprofen (ADVIL,MOTRIN) 200 MG tablet Take 800 mg by mouth every 8 (eight) hours as needed. For pain      . Norgestim-Eth Estrad Triphasic (NORGESTIMATE-ETHINYL ESTRADIOL PO) Take 1 tablet by mouth daily.        Review of Systems  Constitutional: Positive for chills. Negative for fever.  Gastrointestinal: Positive for nausea and vomiting. Negative for abdominal pain.  Genitourinary: Negative.   Neurological: Negative for headaches.   Physical Exam   Blood pressure 117/76, pulse 75, temperature 99.1 F (37.3 C), temperature source Oral, resp. rate 16, height 5' 6.5" (1.689 m), weight 133 lb 12.8 oz (60.691 kg), last menstrual  period 02/07/2013, SpO2 100.00%.  Physical Exam  Constitutional: She is oriented to person, place, and time. She appears well-developed and well-nourished. No distress.  HENT:  Head: Normocephalic.  Neck: Normal range of motion.  Cardiovascular: Normal rate.   Respiratory: Effort normal.  GI: She exhibits no distension. There is no tenderness. There is no rebound and no guarding.  Genitourinary:  Not indicated  Neurological: She is alert and oriented to person, place, and time.  Skin: Skin is warm and dry.  Psychiatric: She has a normal mood and affect. Her behavior is normal.    Results for orders placed during the hospital encounter of 02/24/13 (from the past 24 hour(s))  URINALYSIS, ROUTINE W REFLEX MICROSCOPIC     Status: Abnormal   Collection Time    02/24/13 10:30 AM      Result Value Range   Color, Urine YELLOW  YELLOW   APPearance CLOUDY (*) CLEAR   Specific Gravity, Urine 1.020  1.005 - 1.030   pH 6.5  5.0 - 8.0   Glucose, UA NEGATIVE  NEGATIVE mg/dL   Hgb urine dipstick NEGATIVE  NEGATIVE   Bilirubin Urine NEGATIVE  NEGATIVE   Ketones, ur NEGATIVE  NEGATIVE mg/dL   Protein, ur NEGATIVE  NEGATIVE mg/dL   Urobilinogen, UA 2.0 (*) 0.0 - 1.0 mg/dL  Nitrite NEGATIVE  NEGATIVE   Leukocytes, UA NEGATIVE  NEGATIVE  POCT PREGNANCY, URINE     Status: None   Collection Time    02/24/13 11:19 AM      Result Value Range   Preg Test, Ur NEGATIVE  NEGATIVE   MAU Course  Procedures  MDM Discussed with the patient the possibility of virus vs food she ate last night.  Discussed using medication or not.  She states she still feels like she may vomiting so she would like med to pharmacy.  Instructions given for importance of staying well hydrated and  Eating BRAT diet.  Assessment and Plan  A:  Nausea and vomiting      Neg pregnancy test     Possible viral illness  P:  Rx for Phenergan 12.5mg  tab #10 to pharmacy     Discussed may make her drowsy     BRAT diet     If  continues, will need further evaluation  Kimmie Doren,EVE M 02/24/2013, 1:27 PM

## 2013-05-06 ENCOUNTER — Emergency Department (HOSPITAL_COMMUNITY)
Admission: EM | Admit: 2013-05-06 | Discharge: 2013-05-06 | Disposition: A | Discharge status: Home / Self Care | Payer: Self-pay | Attending: Emergency Medicine | Admitting: Emergency Medicine

## 2013-05-06 ENCOUNTER — Encounter (HOSPITAL_COMMUNITY): Payer: Self-pay | Admitting: Adult Health

## 2013-05-06 DIAGNOSIS — K089 Disorder of teeth and supporting structures, unspecified: Secondary | ICD-10-CM | POA: Insufficient documentation

## 2013-05-06 DIAGNOSIS — K029 Dental caries, unspecified: Secondary | ICD-10-CM | POA: Insufficient documentation

## 2013-05-06 DIAGNOSIS — H9209 Otalgia, unspecified ear: Secondary | ICD-10-CM | POA: Insufficient documentation

## 2013-05-06 DIAGNOSIS — F17200 Nicotine dependence, unspecified, uncomplicated: Secondary | ICD-10-CM | POA: Insufficient documentation

## 2013-05-06 DIAGNOSIS — Z87440 Personal history of urinary (tract) infections: Secondary | ICD-10-CM | POA: Insufficient documentation

## 2013-05-06 DIAGNOSIS — K0889 Other specified disorders of teeth and supporting structures: Secondary | ICD-10-CM

## 2013-05-06 MED ORDER — PENICILLIN V POTASSIUM 500 MG PO TABS: 500.0000 mg | ORAL_TABLET | Freq: Four times a day (QID) | ORAL | Status: AC

## 2013-05-06 MED ORDER — KETOROLAC TROMETHAMINE 60 MG/2ML IM SOLN
60.0000 mg | Freq: Once | INTRAMUSCULAR | Status: AC
  Administered 2013-05-06: 60 mg via INTRAMUSCULAR
  Filled 2013-05-06: qty 2

## 2013-05-06 MED ORDER — OXYCODONE-ACETAMINOPHEN 5-325 MG PO TABS
1.0000 | ORAL_TABLET | Freq: Once | ORAL | Status: AC
  Administered 2013-05-06: 1 via ORAL
  Filled 2013-05-06: qty 1

## 2013-05-06 MED ORDER — HYDROCODONE-ACETAMINOPHEN 5-325 MG PO TABS: 1.0000 | ORAL_TABLET | Freq: Four times a day (QID) | ORAL | Status: DC | PRN

## 2013-05-06 MED ORDER — IBUPROFEN 800 MG PO TABS: 800.0000 mg | ORAL_TABLET | Freq: Three times a day (TID) | ORAL | Status: DC

## 2013-05-06 NOTE — ED Notes (Signed)
Pt has ride home.

## 2013-05-06 NOTE — Discharge Instructions (Signed)
Start penicillin as prescribed, take it until all gone. Take ibuprofen as prescribed for pain. Do not take any additional over-the-counter ibuprofen, Motrin, Aleve, Advil. Take Norco as prescribed as needed for severe pain. Followup with a dentist.  RESOURCE GUIDE  Chronic Pain Problems: Contact [facility]  [phone] Patients need to be referred by their primary care doctor.  Insufficient Money for Medicine: Contact United Way:  call [phone]  No Primary Care Doctor: - Call Health Connect  [phone] - can help you locate a primary care doctor that  accepts your insurance, provides certain services, etc. - Physician Referral Service- [phone]  Agencies that provide inexpensive medical care: - [facility]  [phone] - [facility]  [phone] - [facility]  [phone] - Women's Clinic  [phone] - Planned Parenthood  [phone] [facility]  [phone]  Medicaid-accepting [facility] Providers: - [facility]- [address]  [phone], Mon-Fri 9am-7pm, Sat 9am-1pm - [facility]- [address] [place], Suite [place]  [phone] - [facility]- [address], Suite [place]  [phone] [facility]- [address]  [phone] - Renaye Rakers- [address] [place], [address], [phone]  Only accepts [place] patients after they have their name  applied to their card  Self Pay (no insurance) in [place]: - Sickle Cell Patients - [facility]  [address] [place], [phone] - [facility]- [address] [place]  [phone]       [facility]- [address]       -     [facility]- see information above (Speak to [employer] if you do not have insurance)       -  [facility]- [address],  [phone]       -  [facility]- [address], [phone]       -  Dr Julio Sicks-  [address], [place], [phone]       -  [facility] - [address], [phone]       -  [facility]- [address] [phone], also [address], [phone]       -     [facility]- [address] [place], [phone], 1st & 3rd Saturday         every month, 10am-1pm  -     [facility]   [address] [place], [place].   Phone:  [phone], Fax:  [phone]. Hours of Operation:  9 am - 6 pm, M-F.  -     [facility]   [address], [place]   Phone: [phone], Fax: [phone]. Hours of Operation:  8:30 am - 5:30 pm, M-F.  [facility] [address] [place], [place] [address] [phone]  [facility] [address] [place], [place] [address] [phone]  1) Find a Doctor and Pay Out of Pocket Although you won't have to find out who is covered by your insurance plan, it is a good idea to ask around and get recommendations. You will then need to call the office and see if the doctor you have  chosen will accept you as a new patient and what types of options they offer for patients who are self-pay. Some doctors offer discounts or will set up payment plans for their patients who do not have insurance, but you will need to ask so you aren't surprised when you get to your appointment.  2) Contact Your Local Health Department Not all health departments have doctors that can see patients for sick visits, but many do, so it is worth a call to see if yours does. If you don't know where your local health department is, you can check in your phone book. The CDC also has a tool to help you locate your state's health department, and many state websites also have listings of all of their local health departments.  3) Find a Walk-in Clinic If your illness is not likely to be  very severe or complicated, you may want to try a walk in clinic. These are popping up all over the country in pharmacies, drugstores, and shopping centers. They're usually staffed by nurse practitioners or physician assistants that have been trained to treat common illnesses and complaints. They're usually fairly quick and inexpensive. However, if you have serious medical issues or chronic medical problems, these are probably not your best option  STD Testing - [facility] of [facility] [place], STD Clinic, [address], [place], phone [phone] or [phone].  Monday - Friday, call for an appointment. [facility] of [employer], STD Clinic, [address], [place], phone [phone] or [phone].  Monday - Friday, call for an appointment.  Abuse/Neglect: [facility] Child Abuse Hotline 414-887-6575 [facility] Child Abuse Hotline [phone] (After Hours)  Emergency Shelter:  [facility] 629-797-0050  Maternity Homes: - Room at the [place] [facility] ([phone] - [facility] [phone]  MRSA Hotline #:   4325075308  Dental Assistance If unable to pay or uninsured, contact:  [facility]. to become qualified for the adult dental clinic.  Patients with Medicaid: [facility] [phone] [address], [phone] [address], [phone]  If unable to pay, or uninsured, contact [facility] [phone] in [place], [phone] in [facility]) to become qualified for the adult dental clinic  [facility] [address] [place], [place] [address] [phone] [email or web address]  Other Proofreader Services: - [facility]- [address] [place], [place], [place], [address], [phone], Ext. [phone], 2nd and 4th Thursday of the month at 6:30am.  10 clients each day by appointment, can sometimes see  walk-in patients if someone does not show for an appointment. [facility]- [address] [place], [place], [address], [phone] - [facility] [address], [place], [place], [address], [phone] - [facility]- [phone] [facility] Health Department- [phone] [facility] [phone]       Behavioral Health Resources in the [facility]  Intensive Outpatient Programs: [facility]      [address] [place], [place] [phone] Both a day and evening program       [facility]     [address]        [place], [place] [address] [phone]         ADS: [facility] Drug [facility] [address] [place] [phone]  [facility] Mental Health  ACCESS LINE: (223)198-6888 or ([phone] [place], [place] [address] [email or web address]   Substance Abuse Resources: - [facility] Drug Services  [phone] - [facility] 5162061375 - [facility] [place] [phone] Floydene Flock [phone] - Residential & Outpatient Substance Abuse Program  [phone]  Psychological Services: [facility]  [phone] [facility] Services  [phone] - [facility], [phone] [place]. [address] [place], ACCESS LINE: [phone] or 2016889037, [email or web address]  Mobile Crisis Teams:                                        Therapeutic Alternatives         Mobile Crisis Care Unit [phone]             [facility] [address] Ginette Otto [phone]                                         Interventionist [address] [place] [phone]  Self-Help/Support Groups: Mental Health Assoc. of [employer] of support groups (301) [phone] (call for more  info)  Narcotics Anonymous (NA) Caring Services [address] [place] - 2 meetings at this location  Residential Treatment Programs:  [facility]      [address]        [place]       [phone]         [facility] [address] [place] [address] [place], [place]  [address] [phone]  [facility]  [address] [place], [place] [address] [phone] Admissions: 8am-3pm M-F  [facility]     [address]        [place], [place] [address]       [phone]         [facility] [address] [place], [place] [phone]  [facility] [address] [place], [place] [phone]  [facility] - Intensive Outpatient      [address]     [place], [place]       [phone]         [facility] (Addiction Recovery Care [facility]oc.)     [address] [place], [place] [phone] or [phone]  Residential Treatment Services (RTS), Medicaid [address] [place], [place] [phone]  Fellowship [address]                                               [address] [place] [phone]  [facility] Resources: [phone]               General Therapy                                                Angie Fava, PhD        [phone]  [place], [place] [address]         [phone]   Insurance  [facility] Behavioral   [address] [place], [place] [address] [phone]  [facility] [address] [place], [place] [address] [phone] Insurance/Medicaid/sponsorship through [facility]                                              [address]                                        [place], [place] [address]    Therapy/tele-psych/case         [phone]          [facility] [address] [place],  [place]  [address]  Adolescent/group home/case management [phone]                                           Creola Corn PhD       General therapy       Insurance   [phone]         Dr. Lolly Mustache, Insurance, M-F [phone]  [facility]  United [employer] [facility] Dept. [address]                 [address]         [address] [place] [address]  Blondell Reveal Phone:  [phone]                                  Phone:  [phone]                   Phone:  [phone]  [facility] Mental Health, [phone] - [facility] - [facility]- [phone]       -     [facility] in [place], [address]             [phone], Insurance  [place] Child Abuse Hotline 223-414-2511 or [phone] (After Hours)

## 2013-05-06 NOTE — ED Notes (Signed)
Presents with left sided dental pain that began last night with raditation to eyes and ear. Back molar not fully intact. Pt is tearful.

## 2013-05-06 NOTE — ED Provider Notes (Signed)
CSN: [id number]     Arrival date & time 05/06/13  1842 History   First MD Initiated Contact with Patient 05/06/13 1900     Chief Complaint  Patient presents with  . Dental Pain    HPI HPI Comments: Melanie Strong is a 27 y.o. female who presents to the Emergency Department complaining of dental pain. Patient states her pain began 4 days ago. Pain is in the left upper tooth radiating into the left ear. Patient states that she has been taking ibuprofen, BC powder, Tylenol with no relief. He should states pain gradually worsened with every day, states today's unbearable. Patient reports a surrounding gum swelling. Patient denies any facial swelling, fever, chills, malaise. Patient does not have a dentist.   Past Medical History  Diagnosis Date  . Glucose found in urine on examination   . No pertinent past medical history   . Infection     UTI   Past Surgical History  Procedure Laterality Date  . No past surgeries     Family History  Problem Relation Age of Onset  . Other Neg Hx   . Hypertension Mother   . Cancer Paternal Aunt   . Diabetes Maternal Grandmother   . Hypertension Maternal Grandmother    History  Substance Use Topics  . Smoking status: Current Every Day Smoker -- 0.50 packs/day for 10 years    Types: Cigarettes  . Smokeless tobacco: Never Used  . Alcohol Use: Yes     Comment: occasionally   OB History   Grav Para Term Preterm Abortions TAB SAB Ect Mult Living   0              Review of Systems  Constitutional: Negative for fever and chills.  HENT: Positive for ear pain and dental problem. Negative for facial swelling, neck pain and neck stiffness.     Allergies  Review of patient's allergies indicates no known allergies.  Home Medications   Current Outpatient Rx  Name  Route  Sig  Dispense  Refill  . ibuprofen (ADVIL,MOTRIN) 200 MG tablet   Oral   Take 800 mg by mouth every 8 (eight) hours as needed. For pain         . Norgestim-Eth Estrad  Triphasic (NORGESTIMATE-ETHINYL ESTRADIOL PO)   Oral   Take 1 tablet by mouth daily.         . promethazine (PHENERGAN) 12.5 MG tablet   Oral   Take 1 tablet (12.5 mg total) by mouth every 6 (six) hours as needed for nausea.   10 tablet   0    Triage Vitals: BP 128/87  Pulse 85  Temp(Src) 98.2 F (36.8 C) (Oral)  Resp 18  Ht 5\' 5"  (1.651 m)  Wt 131 lb (59.421 kg)  BMI 21.8 kg/m2  SpO2 100%  Physical Exam  Nursing note and vitals reviewed. Constitutional: She appears well-developed and well-nourished. No distress.  HENT:  Head: Normocephalic.  Right Ear: External ear normal.  Left Ear: External ear normal.  Nose: Nose normal.  Mouth/Throat: Oropharynx is clear and moist.  Cavity to the left upper second molar. There is surrounding gum swelling and erythema. Tender to palpation. No swelling under the tongue.  Neck: Neck supple.  Cardiovascular: Normal rate, regular rhythm and normal heart sounds.   Pulmonary/Chest: Effort normal and breath sounds normal. No respiratory distress. She has no wheezes. She has no rales.  Neurological: She is alert.  Skin: Skin is warm and dry.  Psychiatric:  She has a normal mood and affect. Her behavior is normal.    ED Course  Procedures (including critical care time)  DIAGNOSTIC STUDIES: Oxygen Saturation is 100% on room air, normal by my interpretation.    COORDINATION OF CARE: 7:16 PM-Discussed treatment plan which includes pain medications, antibiotics, follow up with a dentist, with pt at bedside and pt agreed to plan.      Labs Review Labs Reviewed - No data to display Imaging Review No results found.  MDM   1. Pain, dental      Patient with a left upper dental pain. There is surrounding gum swelling and tenderness on exam. She does not have any facial swelling, no swelling under the tongue, she is afebrile. Will start her on penicillin for infection I do suspect she most likely has a dental abscess. Will give  ibuprofen and Norco for pain. Multiple resources for followup provided.  Filed Vitals:   05/06/13 1852  BP: 128/87  Pulse: 85  Temp: 98.2 F (36.8 C)  TempSrc: Oral  Resp: 18  Height: 5\' 5"  (1.651 m)  Weight: 131 lb (59.421 kg)  SpO2: 100%   I personally performed the services described in this documentation, which was scribed in my presence. The recorded information has been reviewed and is accurate.    Lottie Mussel, PA-C 05/06/13 1945

## 2013-05-07 NOTE — ED Provider Notes (Signed)
Medical screening examination/treatment/procedure(s) were performed by non-physician practitioner and as supervising physician I was immediately available for consultation/collaboration.  Celine Dishman L Keniah Klemmer, MD 05/07/13 0053 

## 2013-07-04 ENCOUNTER — Inpatient Hospital Stay (HOSPITAL_COMMUNITY)
Admission: AD | Admit: 2013-07-04 | Discharge: 2013-07-04 | Disposition: A | Discharge status: Home / Self Care | Payer: Medicaid Other | Source: Ambulatory Visit | Attending: Obstetrics & Gynecology | Admitting: Obstetrics & Gynecology

## 2013-07-04 ENCOUNTER — Encounter (HOSPITAL_COMMUNITY): Payer: Self-pay | Admitting: *Deleted

## 2013-07-04 DIAGNOSIS — B9689 Other specified bacterial agents as the cause of diseases classified elsewhere: Secondary | ICD-10-CM | POA: Insufficient documentation

## 2013-07-04 DIAGNOSIS — A499 Bacterial infection, unspecified: Secondary | ICD-10-CM | POA: Insufficient documentation

## 2013-07-04 DIAGNOSIS — N760 Acute vaginitis: Secondary | ICD-10-CM | POA: Insufficient documentation

## 2013-07-04 DIAGNOSIS — N938 Other specified abnormal uterine and vaginal bleeding: Secondary | ICD-10-CM | POA: Insufficient documentation

## 2013-07-04 DIAGNOSIS — N949 Unspecified condition associated with female genital organs and menstrual cycle: Secondary | ICD-10-CM | POA: Insufficient documentation

## 2013-07-04 DIAGNOSIS — N898 Other specified noninflammatory disorders of vagina: Secondary | ICD-10-CM

## 2013-07-04 DIAGNOSIS — N939 Abnormal uterine and vaginal bleeding, unspecified: Secondary | ICD-10-CM

## 2013-07-04 LAB — URINALYSIS, ROUTINE W REFLEX MICROSCOPIC
Bilirubin Urine: NEGATIVE
Ketones, ur: 15 mg/dL — AB
Nitrite: NEGATIVE
Protein, ur: NEGATIVE mg/dL
pH: 6 (ref 5.0–8.0)

## 2013-07-04 LAB — URINE MICROSCOPIC-ADD ON

## 2013-07-04 LAB — CBC
Hemoglobin: 10.6 g/dL — ABNORMAL LOW (ref 12.0–15.0)
MCH: 26.5 pg (ref 26.0–34.0)
MCHC: 32.9 g/dL (ref 30.0–36.0)
RDW: 13.9 % (ref 11.5–15.5)

## 2013-07-04 LAB — WET PREP, GENITAL
Trich, Wet Prep: NONE SEEN
Yeast Wet Prep HPF POC: NONE SEEN

## 2013-07-04 MED ORDER — OXYCODONE-ACETAMINOPHEN 5-325 MG PO TABS
1.0000 | ORAL_TABLET | Freq: Once | ORAL | Status: AC
  Administered 2013-07-04: 1 via ORAL
  Filled 2013-07-04: qty 1

## 2013-07-04 MED ORDER — KETOROLAC TROMETHAMINE 60 MG/2ML IM SOLN
60.0000 mg | Freq: Once | INTRAMUSCULAR | Status: AC
  Administered 2013-07-04: 60 mg via INTRAMUSCULAR
  Filled 2013-07-04: qty 2

## 2013-07-04 MED ORDER — METRONIDAZOLE 500 MG PO TABS: 500.0000 mg | ORAL_TABLET | Freq: Two times a day (BID) | ORAL | Status: DC

## 2013-07-04 MED ORDER — NORGESTIMATE-ETH ESTRADIOL 0.25-35 MG-MCG PO TABS: 1.0000 | ORAL_TABLET | Freq: Every day | ORAL | Status: DC

## 2013-07-04 MED ORDER — BUTALBITAL-APAP-CAFFEINE 50-325-40 MG PO TABS: 1.0000 | ORAL_TABLET | Freq: Four times a day (QID) | ORAL | Status: DC | PRN

## 2013-07-04 MED ORDER — IBUPROFEN 600 MG PO TABS: 600.0000 mg | ORAL_TABLET | Freq: Four times a day (QID) | ORAL | Status: DC | PRN

## 2013-07-04 MED ORDER — OXYCODONE-ACETAMINOPHEN 5-325 MG PO TABS: 1.0000 | ORAL_TABLET | Freq: Four times a day (QID) | ORAL | Status: DC | PRN

## 2013-07-04 NOTE — MAU Provider Note (Signed)
Attestation of Attending Supervision of Advanced Practitioner (CNM/NP): Evaluation and management procedures were performed by the Advanced Practitioner under my supervision and collaboration.  I have reviewed the Advanced Practitioner's note and chart, and I agree with the management and plan.  HARRAWAY-SMITH, Gualberto Wahlen 5:47 PM     

## 2013-07-04 NOTE — MAU Provider Note (Signed)
History     CSN: [id number]  Arrival date and time: 07/04/13 1359   First Provider Initiated Contact with Patient 07/04/13 1505      Chief Complaint  Patient presents with  . Vaginal Bleeding   HPI  [name] is a 26 y.o. female G0P0 who presents to MAU with vaginal bleeding. She was seen last Friday by her primary care dr. Who put her on birthcontrol for protection and for abdominal cramping. She started bleeding last Friday, which was her normal time to start her cycle and has been bleeding and cramping for 10 days. She has taken Ibuprofen for the pain; last dose was this morning at 0600, 400 mg. She is using Tampons and pads throughout the day and uses approximately 6 tampons per day.  She typically has heavy and painful menstrual cycles.  When her PCP started the birthcontrol pills he discussed the possibility of increasing the estrogen level in the [facility] pill if the lower dose did not help the cramping.   OB History   Grav Para Term Preterm Abortions TAB SAB Ect Mult Living   0               Past Medical History  Diagnosis Date  . Glucose found in urine on examination   . No pertinent past medical history   . Infection     UTI    Past Surgical History  Procedure Laterality Date  . No past surgeries      Family History  Problem Relation Age of Onset  . Other Neg Hx   . Hypertension Mother   . Cancer Paternal Aunt   . Diabetes Maternal Grandmother   . Hypertension Maternal Grandmother     History  Substance Use Topics  . Smoking status: Current Every Day Smoker -- 0.50 packs/day for 10 years    Types: Cigarettes  . Smokeless tobacco: Never Used  . Alcohol Use: Yes     Comment: occasionally    Allergies: No Known Allergies  Prescriptions prior to admission  Medication Sig Dispense Refill  . ibuprofen (ADVIL,MOTRIN) 200 MG tablet Take 800 mg by mouth every 8 (eight) hours as needed. For pain      . levonorgestrel-ethinyl estradiol (AUBRA) 0.1-20  MG-MCG tablet Take 1 tablet by mouth daily.      . Menthol, Topical Analgesic, (ICY HOT EX) Apply 1 application topically at bedtime as needed (back pain).       Results for orders placed during the hospital encounter of 07/04/13 (from the past 24 hour(s))  URINALYSIS, ROUTINE W REFLEX MICROSCOPIC     Status: Abnormal   Collection Time    07/04/13  2:10 PM      Result Value Range   Color, Urine YELLOW  YELLOW   APPearance CLEAR  CLEAR   Specific Gravity, Urine >1.030 (*) 1.005 - 1.030   pH 6.0  5.0 - 8.0   Glucose, UA NEGATIVE  NEGATIVE mg/dL   Hgb urine dipstick MODERATE (*) NEGATIVE   Bilirubin Urine NEGATIVE  NEGATIVE   Ketones, ur 15 (*) NEGATIVE mg/dL   Protein, ur NEGATIVE  NEGATIVE mg/dL   Urobilinogen, UA 0.2  0.0 - 1.0 mg/dL   Nitrite NEGATIVE  NEGATIVE   Leukocytes, UA NEGATIVE  NEGATIVE  URINE MICROSCOPIC-ADD ON     Status: Abnormal   Collection Time    07/04/13  2:10 PM      Result Value Range   Squamous Epithelial / LPF FEW (*)  RARE   WBC, UA 0-2  <3 WBC/hpf   RBC / HPF 11-20  <3 RBC/hpf   Bacteria, UA FEW (*) RARE  POCT PREGNANCY, URINE     Status: None   Collection Time    07/04/13  2:17 PM      Result Value Range   Preg Test, Ur NEGATIVE  NEGATIVE  CBC     Status: Abnormal   Collection Time    07/04/13  2:27 PM      Result Value Range   WBC 6.7  4.0 - 10.5 K/uL   RBC 4.00  3.87 - 5.11 MIL/uL   Hemoglobin 10.6 (*) 12.0 - 15.0 g/dL   HCT 16.1 (*) 09.6 - 04.5 %   MCV 80.5  78.0 - 100.0 fL   MCH 26.5  26.0 - 34.0 pg   MCHC 32.9  30.0 - 36.0 g/dL   RDW 40.9  81.1 - 91.4 %   Platelets 270  150 - 400 K/uL  WET PREP, GENITAL     Status: Abnormal   Collection Time    07/04/13  3:50 PM      Result Value Range   Yeast Wet Prep HPF POC NONE SEEN  NONE SEEN   Trich, Wet Prep NONE SEEN  NONE SEEN   Clue Cells Wet Prep HPF POC MANY (*) NONE SEEN   WBC, Wet Prep HPF POC FEW (*) NONE SEEN    Review of Systems  Constitutional: Negative for fever and chills.   Gastrointestinal: Positive for nausea and abdominal pain.       No vaginal discharge. + vaginal bleeding. No dysuria.   Genitourinary: Negative for dysuria, urgency, frequency and hematuria.  Neurological: Negative for weakness.   Physical Exam   Blood pressure 132/74, pulse 73, temperature 98.3 F (36.8 C), resp. rate 18, last menstrual period 07/04/2013.  Physical Exam  Constitutional: She is oriented to person, place, and time. She appears well-developed and well-nourished. No distress.  HENT:  Head: Normocephalic.  Eyes: Pupils are equal, round, and reactive to light.  Neck: Normal range of motion.  GI: Soft. She exhibits no distension. There is no tenderness. There is no rebound and no guarding.  Genitourinary:  Speculum exam: Vagina - Small amount of dark red blood pooling in canal  no odor Cervix - small amount of active bleeding from cervical os Bimanual exam:  Cervix closed Uterus non tender, normal size Adnexa non tender, no masses bilaterally GC/Chlam, wet prep done Chaperone present for exam.   Neurological: She is alert and oriented to person, place, and time.  Skin: Skin is warm. She is not diaphoretic.  Psychiatric: Her behavior is normal.    MAU Course  Procedures None  MDM UA UPT Toradol 60 mg IM  Wet prep GC/Chlamydia  Pt very uncomfortable, states the Toradol is not helping her cramping 1 percocet tab given PO in MAU   The patient asks that I call in and change her birthcontrol script incase she cannot get in to see her primary Dr. She plans to call tomorrow and discuss other options.  Patient rates her pain 2/10 following percocet.   Assessment and Plan   A: 1. Abnormal vaginal bleeding   2. BV (bacterial vaginosis)    P: Discharge home RX: Flagyl- no alcohol         Percocet        Ibuprofen         sprintec  Follow up with your PCP tomorrow and  discuss birth control changes and options.  Return to MAU as needed, if symptoms  worsen.   RASCH, JENNIFER IRENE FNP-C  07/04/2013, 5:30 PM

## 2013-07-04 NOTE — MAU Note (Signed)
Pt presents with complaints of vaginal bleeding. She says that she has been evaluated several times and had her birth control changed several times for the vaginal bleeding and she says she continues to have a large amount of bleeding.

## 2013-07-05 LAB — GC/CHLAMYDIA PROBE AMP: GC Probe RNA: NEGATIVE

## 2013-07-16 ENCOUNTER — Encounter (HOSPITAL_COMMUNITY): Payer: Self-pay | Admitting: Emergency Medicine

## 2013-07-16 ENCOUNTER — Emergency Department (HOSPITAL_COMMUNITY)
Admission: EM | Admit: 2013-07-16 | Discharge: 2013-07-16 | Disposition: A | Discharge status: Home / Self Care | Payer: Medicaid Other | Attending: Emergency Medicine | Admitting: Emergency Medicine

## 2013-07-16 DIAGNOSIS — R51 Headache: Secondary | ICD-10-CM | POA: Insufficient documentation

## 2013-07-16 DIAGNOSIS — Z87440 Personal history of urinary (tract) infections: Secondary | ICD-10-CM | POA: Insufficient documentation

## 2013-07-16 DIAGNOSIS — L309 Dermatitis, unspecified: Secondary | ICD-10-CM

## 2013-07-16 DIAGNOSIS — T7840XA Allergy, unspecified, initial encounter: Secondary | ICD-10-CM

## 2013-07-16 DIAGNOSIS — R6883 Chills (without fever): Secondary | ICD-10-CM | POA: Insufficient documentation

## 2013-07-16 DIAGNOSIS — L259 Unspecified contact dermatitis, unspecified cause: Secondary | ICD-10-CM | POA: Insufficient documentation

## 2013-07-16 DIAGNOSIS — F17200 Nicotine dependence, unspecified, uncomplicated: Secondary | ICD-10-CM | POA: Insufficient documentation

## 2013-07-16 MED ORDER — PREDNISONE 20 MG PO TABS
60.0000 mg | ORAL_TABLET | Freq: Once | ORAL | Status: AC
  Administered 2013-07-16: 60 mg via ORAL
  Filled 2013-07-16: qty 3

## 2013-07-16 MED ORDER — PREDNISONE 10 MG PO TABS: 20.0000 mg | ORAL_TABLET | Freq: Every day | ORAL | Status: DC

## 2013-07-16 NOTE — ED Provider Notes (Signed)
CSN: [id number]     Arrival date & time 07/16/13  6834 History  This chart was scribed for non-physician practitioner, Jamse Mead, PA-C, working with Jasper Riling. Alvino Chapel, MD by Roe Coombs, ED Scribe. This patient was seen in room TR10C/TR10C and the patient's care was started at 11:23 AM.    Chief Complaint  Patient presents with  . Rash    The history is provided by the patient. No language interpreter was used.    HPI Comments: Melanie Strong is a 27 y.o. female who presents to the Emergency Department complaining of a pruritic rash to her neck and face onset yesterday. She states that she began to itch yesterday and then this morning noticed a rash to her neck and face. She says that she had some itching on her back, but was unable to see if there was a rash there. 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 She reports associated perioral tingling of the top lip, tingling in her hands, chills and headaches. She says that she took Benadryl this morning which improved itching. She hasn't used any new soaps, make ups or perfumes. She denies tongue swelling, difficulty breathing, chest pain, dysphagia, throat closing sensation, nausea, vomiting, blurred vision, dizziness, neck pain, neck stiffness, facial swelling, or weakness. She has no known allergies.   Past Medical History  Diagnosis Date  . Glucose found in urine on examination   . No pertinent past medical history   . Infection     UTI   Past Surgical History  Procedure Laterality Date  . No past surgeries     Family History  Problem Relation Age of Onset  . Other Neg Hx   . Hypertension Mother   . Cancer Paternal Aunt   . Diabetes Maternal Grandmother   . Hypertension Maternal Grandmother    History  Substance Use Topics  . Smoking status: Current Every Day Smoker -- 0.50 packs/day for 10 years    Types: Cigarettes  . Smokeless tobacco: Never Used  .  Alcohol Use: Yes     Comment: occasionally   OB History   Grav Para Term Preterm Abortions TAB SAB Ect Mult Living   0              Review of Systems  Constitutional: Positive for chills. Negative for fever.  HENT: Negative for facial swelling and trouble swallowing.   Eyes: Negative for visual disturbance.  Respiratory: Negative for shortness of breath.   Cardiovascular: Negative for chest pain.  Gastrointestinal: Negative for nausea and vomiting.  Musculoskeletal: Negative for neck pain and neck stiffness.  Skin: Positive for rash.  Neurological: Positive for headaches. Negative for dizziness, weakness and numbness.       Positive for tingling in hands and of both lip.  All other systems reviewed and are negative.    Allergies  Review of patient's allergies indicates no known allergies.  Home Medications  No current outpatient prescriptions on file.  Triage Vitals: BP 140/83  Pulse 96  Temp(Src) 97.3 F (36.3 C) (Oral)  Resp 18  SpO2 100%  LMP 07/04/2013 Physical Exam  Nursing note and vitals reviewed. Constitutional: She is oriented to person, place, and time. She appears well-developed and well-nourished. No distress.  HENT:  Head: Normocephalic and atraumatic.  Negative facial angioedema identified Small scattered papules identified to the skin, some consisting of scab secondary to itching. Negative active bleeding or drainage  noted. Does not appear urticarial in nature. Suspicion more so to be dermatitis.  Eyes: Conjunctivae and EOM are normal. Pupils are equal, round, and reactive to light. Right eye exhibits no discharge. Left eye exhibits no discharge.  Neck: Normal range of motion. Neck supple.  Negative neck stiffness Negative nuchal rigidity Negative cervical lymphadenopathy Negative meningeal signs  Cardiovascular: Normal rate, regular rhythm and normal heart sounds.   Pulses:      Radial pulses are 2+ on the right side, and 2+ on the left side.   Pulmonary/Chest: Effort normal and breath sounds normal. No respiratory distress. She has no wheezes. She has no rales.  Patient able to speak in full sentences without difficulty Negative respiratory distress noted  Neurological: She is alert and oriented to person, place, and time. She exhibits normal muscle tone. Coordination normal.  Skin: Skin is warm and dry. Rash noted. She is not diaphoretic.  Please see HEENT  Psychiatric: She has a normal mood and affect. Her behavior is normal. Thought content normal.    ED Course  Procedures (including critical care time) DIAGNOSTIC STUDIES: Oxygen Saturation is 100% on room air, normal by my interpretation.    COORDINATION OF CARE: 11:46 AM- Patient informed of current plan for treatment and evaluation and agrees with plan at this time.     Labs Review Labs Reviewed - No data to display Imaging Review No results found.  EKG Interpretation   None       MDM   1. Allergic reaction, initial encounter   2. Dermatitis    Medications  predniSONE (DELTASONE) tablet 60 mg (60 mg Oral Given 07/16/13 1136)   Filed Vitals:   07/16/13 0947  BP: 140/83  Pulse: 96  Temp: 97.3 F (36.3 C)  TempSrc: Oral  Resp: 18  SpO2: 100%   I personally performed the services described in this documentation, which was scribed in my presence. The recorded information has been reviewed and is accurate.  Patient presenting to emergency department with tingling to the upper lip and outbreak of rash to the face that started this morning. 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 Alert and oriented. Lungs clear to auscultation bilaterally. Heart rate and rhythm normal. Pulses palpable, radial 2+ bilaterally. Negative respiratory distress noted, patient is been interval sentences without difficulty. Negative facial angioedema identified. Small red papules noted to the face with intermittent  scabbing across them. Negative active drainage or bleeding noted. Negative rashes to the webspace pain. Does not appear to be urticarial in nature. Suspicion to be dermatitis. Doubt scabies. Negative anaphylaxis. Doubt urticarial rash. Doubt erythema multiforme major and minor. Doubt SJS. 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 Patient given prednisone while in ED setting. Discussed with patient to continue to use Benadryl as needed. Patient stable, afebrile discharge patient. Referred patient to allergist. Discussed with patient to closely monitor which he puts on and put in her body. Discussed with patient if symptoms are to worsen or change report back to emergency department - strict return instructions given. Patient agreed to plan of care, understood, all questions answered.   Jamse Mead, PA-C 07/16/13 [phone]

## 2013-07-16 NOTE — ED Notes (Signed)
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 Started 2 days ago with lips 'tingling'. Yesterday started with itching rash to neck and face. Took Benadryl yesterday with relief of itching. No respiratory distress.

## 2013-07-19 NOTE — ED Provider Notes (Signed)
Medical screening examination/treatment/procedure(s) were performed by non-physician practitioner and as supervising physician I was immediately available for consultation/collaboration.  EKG Interpretation   None        Tynetta Bachmann R. Selin Eisler, MD 07/19/13 0835 

## 2013-10-31 ENCOUNTER — Emergency Department (HOSPITAL_COMMUNITY)
Admission: EM | Admit: 2013-10-31 | Discharge: 2013-11-01 | Discharge status: Against Medical Advice | Payer: Medicaid Other | Attending: Emergency Medicine | Admitting: Emergency Medicine

## 2013-10-31 ENCOUNTER — Encounter (HOSPITAL_COMMUNITY): Payer: Self-pay | Admitting: Emergency Medicine

## 2013-10-31 DIAGNOSIS — F17200 Nicotine dependence, unspecified, uncomplicated: Secondary | ICD-10-CM | POA: Insufficient documentation

## 2013-10-31 DIAGNOSIS — R112 Nausea with vomiting, unspecified: Secondary | ICD-10-CM | POA: Insufficient documentation

## 2013-10-31 DIAGNOSIS — R109 Unspecified abdominal pain: Secondary | ICD-10-CM | POA: Insufficient documentation

## 2013-10-31 LAB — CBC WITH DIFFERENTIAL/PLATELET
Basophils Absolute: 0 10*3/uL (ref 0.0–0.1)
Basophils Relative: 1 % (ref 0–1)
Eosinophils Absolute: 0.3 10*3/uL (ref 0.0–0.7)
Eosinophils Relative: 5 % (ref 0–5)
HCT: 34.8 % — ABNORMAL LOW (ref 36.0–46.0)
Hemoglobin: 11.7 g/dL — ABNORMAL LOW (ref 12.0–15.0)
LYMPHS ABS: 4 10*3/uL (ref 0.7–4.0)
LYMPHS PCT: 66 % — AB (ref 12–46)
MCH: 27.1 pg (ref 26.0–34.0)
MCHC: 33.6 g/dL (ref 30.0–36.0)
MCV: 80.7 fL (ref 78.0–100.0)
MONOS PCT: 7 % (ref 3–12)
Monocytes Absolute: 0.4 10*3/uL (ref 0.1–1.0)
NEUTROS ABS: 1.3 10*3/uL — AB (ref 1.7–7.7)
NEUTROS PCT: 22 % — AB (ref 43–77)
PLATELETS: 239 10*3/uL (ref 150–400)
RBC: 4.31 MIL/uL (ref 3.87–5.11)
RDW: 13.8 % (ref 11.5–15.5)
WBC: 6.1 10*3/uL (ref 4.0–10.5)

## 2013-10-31 LAB — URINE MICROSCOPIC-ADD ON

## 2013-10-31 LAB — I-STAT CHEM 8, ED
BUN: 15 mg/dL (ref 6–23)
CHLORIDE: 106 meq/L (ref 96–112)
CREATININE: 1 mg/dL (ref 0.50–1.10)
Calcium, Ion: 1.23 mmol/L (ref 1.12–1.23)
GLUCOSE: 95 mg/dL (ref 70–99)
HCT: 39 % (ref 36.0–46.0)
Hemoglobin: 13.3 g/dL (ref 12.0–15.0)
POTASSIUM: 3.7 meq/L (ref 3.7–5.3)
SODIUM: 143 meq/L (ref 137–147)
TCO2: 25 mmol/L (ref 0–100)

## 2013-10-31 LAB — URINALYSIS, ROUTINE W REFLEX MICROSCOPIC
BILIRUBIN URINE: NEGATIVE
Glucose, UA: NEGATIVE mg/dL
Ketones, ur: 15 mg/dL — AB
Leukocytes, UA: NEGATIVE
Nitrite: NEGATIVE
PH: 5.5 (ref 5.0–8.0)
Protein, ur: NEGATIVE mg/dL
Specific Gravity, Urine: 1.03 (ref 1.005–1.030)
UROBILINOGEN UA: 1 mg/dL (ref 0.0–1.0)

## 2013-10-31 LAB — POC URINE PREG, ED: Preg Test, Ur: NEGATIVE

## 2013-10-31 MED ORDER — ONDANSETRON 4 MG PO TBDP
8.0000 mg | ORAL_TABLET | Freq: Once | ORAL | Status: AC
  Administered 2013-11-01: 8 mg via ORAL
  Filled 2013-10-31: qty 2

## 2013-10-31 NOTE — ED Notes (Signed)
Patient with abdominal pain with nausea and vomiting.  Patient states she woke up with the vomiting.  Patient is actively vomiting in triage.

## 2013-11-01 NOTE — ED Notes (Signed)
Pt is not in room. Gown, blood pressure cuff and pulse ox is found on bed.

## 2013-11-01 NOTE — ED Notes (Addendum)
Wickline, MD came to this RN after attempting to see the pt, and reported that the pt was not in the room.

## 2014-06-30 ENCOUNTER — Encounter (HOSPITAL_COMMUNITY): Payer: Self-pay | Admitting: Emergency Medicine

## 2014-06-30 ENCOUNTER — Emergency Department (INDEPENDENT_AMBULATORY_CARE_PROVIDER_SITE_OTHER)
Admission: EM | Admit: 2014-06-30 | Discharge: 2014-06-30 | Disposition: A | Discharge status: Home / Self Care | Payer: Self-pay | Source: Home / Self Care | Attending: Family Medicine | Admitting: Family Medicine

## 2014-06-30 DIAGNOSIS — M65832 Other synovitis and tenosynovitis, left forearm: Secondary | ICD-10-CM

## 2014-06-30 MED ORDER — MELOXICAM 15 MG PO TABS: 15.0000 mg | ORAL_TABLET | Freq: Every day | ORAL | Status: DC

## 2014-06-30 MED ORDER — TRAMADOL HCL 50 MG PO TABS: 50.0000 mg | ORAL_TABLET | Freq: Four times a day (QID) | ORAL | Status: DC | PRN

## 2014-06-30 NOTE — ED Provider Notes (Signed)
CSN: [id number]     Arrival date & time 06/30/14  0906 History   First MD Initiated Contact with Patient 06/30/14 [phone]     Chief Complaint  Patient presents with  . Wrist Pain   (Consider location/radiation/quality/duration/timing/severity/associated sxs/prior Treatment) HPI         28 year old female is for evaluation of left wrist pain. For 6 days she has constant, worsening pain in the dorsum of her left wrist. This is made worse with activity and she cannot lift anything heavy with the left hand. She works at [employer] and lifts heavy boxes all day. She denies any specific injury. She tried getting a brace that she put on last night, and it seems to be worse and now her fingers are swelling. She has never had this before. No history of inflammatory arthritis.  Past Medical History  Diagnosis Date  . Glucose found in urine on examination   . No pertinent past medical history   . Infection     UTI   Past Surgical History  Procedure Laterality Date  . No past surgeries     Family History  Problem Relation Age of Onset  . Other Neg Hx   . Hypertension Mother   . Cancer Paternal Aunt   . Diabetes Maternal Grandmother   . Hypertension Maternal Grandmother    History  Substance Use Topics  . Smoking status: Current Every Day Smoker -- 0.50 packs/day for 10 years    Types: Cigarettes  . Smokeless tobacco: Never Used  . Alcohol Use: Yes     Comment: occasionally   OB History   Grav Para Term Preterm Abortions TAB SAB Ect Mult Living   0              Review of Systems  Musculoskeletal: Positive for arthralgias (pain in dorsum of left wrist and forearm).  All other systems reviewed and are negative.   Allergies  Review of patient's allergies indicates no known allergies.  Home Medications   Prior to Admission medications   Medication Sig Start Date End Date Taking? Authorizing Provider  meloxicam (MOBIC) 15 MG tablet Take 1 tablet (15 mg total) by mouth daily. 06/30/14    Graylon GoodZachary H Desmon Hitchner, PA-C  traMADol (ULTRAM) 50 MG tablet Take 1 tablet (50 mg total) by mouth every 6 (six) hours as needed. 06/30/14   Adrian BlackwaterZachary H Autumnrose Yore, PA-C   BP 134/77  Pulse 68  Temp(Src) 99.2 F (37.3 C) (Oral)  Resp 14  SpO2 100%  LMP 06/20/2014 Physical Exam  Nursing note and vitals reviewed. Constitutional: She is oriented to person, place, and time. Vital signs are normal. She appears well-developed and well-nourished. No distress.  HENT:  Head: Normocephalic and atraumatic.  Pulmonary/Chest: Effort normal. No respiratory distress.  Musculoskeletal:       Left wrist: She exhibits tenderness (tenderness along the dorsum of the wrist and up the dorsum of the forearm, not extending to the elbow.).  Pain in the dorsum of the wrist with flexion or extension. Strength is 5/5 bilaterally. No pain with elbow range of motion.  Neurological: She is alert and oriented to person, place, and time. She has normal strength. Coordination normal.  Skin: Skin is warm and dry. No rash noted. She is not diaphoretic.  Psychiatric: She has a normal mood and affect. Judgment normal.    ED Course  Procedures (including critical care time) Labs Review Labs Reviewed - No data to display  Imaging Review No results found.  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 Will use daily meloxicam, when necessary tramadol, ice, and wrist brace for 2 weeks. If no significant improvement in that time she will follow up with orthopedics for consideration for injection.   Meds ordered this encounter  Medications  . meloxicam (MOBIC) 15 MG tablet    Sig: Take 1 tablet (15 mg total) by mouth daily.    Dispense:  30 tablet    Refill:  1    Order Specific Question:  Supervising Provider    Answer:  Clementeen GrahamOREY, EVAN, S [id number]]  . traMADol (ULTRAM) 50 MG tablet    Sig: Take 1 tablet (50 mg total) by mouth every 6 (six) hours as needed.    Dispense:  15 tablet     Refill:  0    Order Specific Question:  Supervising Provider    Answer:  Clementeen GrahamOREY, EVAN, Kathie RhodesS [[id number]]       Graylon GoodZachary H Birgit Nowling, PA-C 06/30/14 [phone]

## 2014-06-30 NOTE — Discharge Instructions (Signed)
Extensor Carpi Ulnaris Tendinitis with Rehab Tendonitis involves inflammation of a tendon, which is a soft tissue that connects muscle to bone. The Extensor Carpi Ulnaris (ECU) tendon is vulnerable to tendonitis. The ECU is responsible for straightening and rotating (abducting) the wrist. The ECU is important for gripping and pulling. ECU tendonitis may include inflammation of the ECU tendon lining (sheath). The tendon sheath usually secretes a fluid that allows the tendon to function smoothly, but when it becomes inflamed, function is impaired. This condition may also include a tear in the ECU tendon or muscle (strain). The strain may be classified as a grade 1 or 2 strain. Grade 1 strains cause pain, but the tendon is not lengthened. Grade 2 strains include a lengthened ligament, due to being stretched or partially ruptured. With grade 2 strains there is still function, although the function may be reduced. SYMPTOMS   Pain, tenderness, swelling, warmth, or redness on the little finger side of the wrist.  Pain that worsens when straightening the wrist or bending it toward the little finger.  Pain with gripping.  Limited motion of the wrist.  Crackling sound (crepitation) when the tendon or wrist is moved or touched. CAUSES  ECU tendonitis is caused by injury to the ECU tendon. It is usually due to chronic or repetitive injuries, but may be due to sharp (acute) injury. Common causes of injury include:  Strain from unusual use, overuse, or increase in activity, or change in activity of the wrist, hand, or forearm.  Direct hit (trauma) to the muscles and tendon on the side of the wrist.  Repetitive motions of the hand and wrist, due to friction of the tendon within the tendon lining. RISK INCREASES WITH:  Sports that involve repetitive hand and wrist motions (i.e. golfing, bowling).  Sports that require gripping (i.e. tennis, golf, weightlifting).  Heavy labor.  Poor wrist and forearm  strength and flexibility.  Failure to warm-up properly before activity. PREVENTION   Warm up and stretch properly before activity.  Allow the body to recover between activities.  Maintain physical fitness:  Strength, flexibility, and endurance.  Cardiovascular fitness.  Learn and use proper exercise technique. PROGNOSIS  If treated properly, ECU tendonitis is usually curable within 6 weeks.  RELATED COMPLICATIONS   Longer healing time, if not properly treated or if not given adequate time to heal.  Chronically inflamed tendon, causing persistent pain with activity. This may progress to constant pain, restriction of motion, and potentially tearing of the tendon.  Recurring symptoms, especially if activity is resumed too soon.  Risks of surgery: infection, bleeding, injury to nerves, continued pain, incomplete release of the tendon lining, recurring symptoms, cutting of the tendon, and weakness of the wrist and grip. TREATMENT  Treatment initially involves the use of ice and medicine, to help reduce pain and inflammation. Performing stretching and strengthening exercises regularly is important for a quick recovery. These exercises may be completed at home or with a therapist. Your caregiver may recommend the use of a brace or splint to reduce motions that aggravate symptoms. Corticosteroid injections may be recommended. If non-surgical treatment is unsuccessful, then surgery may be needed.  MEDICATION   If pain medicine is needed, nonsteroidal anti-inflammatory medicines (aspirin and ibuprofen), or other minor pain relievers (acetaminophen), are often recommended.  Do not take pain medicine for 7 days before surgery.  Prescription pain relievers may be given if your caregiver thinks they are needed. Use only as directed and only as much as you need.  Corticosteroid injections may be given to reduce inflammation. However, these injections should be reserved for serious cases, as  they may only be given a certain number of times. COLD THERAPY  Cold treatment (icing) relieves pain and reduces inflammation. Cold treatment should be applied for 10 to 15 minutes every 2 to 3 hours, and immediately after activity that aggravates your symptoms. Use ice packs or an ice massage. SEEK MEDICAL CARE IF:   Symptoms get worse or do not improve in 2 weeks, despite treatment.  You experience pain, numbness, or coldness in the hand.  Blue, gray, or dark color appears in the fingernails.  Any of the following occur after surgery: increased pain, swelling, redness, drainage of fluids, bleeding in the surgical area, or signs of infection.  New, unexplained symptoms develop. (Drugs used in treatment may produce side effects.) EXERCISES RANGE OF MOTION (ROM) AND STRETCHING EXERCISES - Extensor Carpi Ulnaris Tendinitis These are some of the initial exercises you may start your recovery program with, until you see your caregiver again or until your symptoms are resolved. Remember:  Flexible tissue is more tolerant of the stresses placed on it during activity.  Each stretch should be held for 20 to 30 seconds.  A gentle stretching sensation should be felt. RANGE OF MOTION - Wrist Flexion  Hold your right / left wrist with the fingers pointing down toward the floor.  Pull down on the wrist until you feel a stretch.  Hold this position for __________ seconds. Repeat exercise __________ times, __________ times per day.  This exercise should be done with the elbow: _____ Bent to 90 degrees. _____ Straight. RANGE OF MOTION - Wrist Flexion  Place the back of your right / left hand flat on the top of a table. Your shoulder should be turned in and your fingers facing away from your body.  Press down, bending your wrist and straightening your elbow until your feel a stretch.  Hold this position for __________ seconds.  Repeat exercise __________ times, __________ times per  day. STRENGTHENING EXERCISES - Extensor Carpi Ulnaris Tendinitis These are some of the initial exercises you may start your recovery program with, until you see your caregiver again or until your symptoms are resolved. Remember:  Strong muscles with good endurance tolerate stress better.  Do the exercises as initially prescribed by your caregiver. Progress slowly with each exercise, gradually increasing the number of repetitions and weight used, only as guided. RANGE OF MOTION - Wrist Extensors  Sit or stand with your forearm supported.  Using a __________ weight or a piece of rubber band or tubing, bend your wrist slowly upward toward you.  Hold this position for __________ seconds and then slowly lower the wrist back to the starting position.  Repeat exercise __________ times, __________ times per day. RANGE OF MOTION - Wrist, Ulnar Deviation  Stand with a hammer in your hand, or sit while holding onto a rubber band or tubing with both hands, and with your arm supported on a table.  Raise your hand with the hammer upward behind you, or pull down on the rubber tubing.  Hold this position for __________ seconds and then slowly lower the wrist back to the starting position.  Repeat exercise __________ times, __________ times per day. STRENGTH - Grip  Hold a wad of putty, soft modeling clay, a large sponge, a soft rubber ball or a soft tennis ball in your hand.  Squeeze as hard as you can.  Hold this position for  __________ seconds.  Repeat exercise __________ times, __________ times per day. Document Released: 08/19/2005 Document Revised: 11/11/2011 Document Reviewed: 12/01/2008 [facility] Patient Information 2015 [place], [place]. This information is not intended to replace advice given to you by your health care provider. Make sure you discuss any questions you have with your health care provider.  Intersection Syndrome Irritation of tendons in the wrist and forearm may cause pain  and inflammation on the thumb side (radial) of the forearm. This pain is a sign of intersection syndrome. It is called intersection syndrome because the irritation occurs where four separate tendons intersect. Two of these tendons are used for extending the wrist: extensor carpi radialis longus (ECRL), and extensor carpi radialis brevis (ECRB). The other two are used for extending the thumb: extensor pollicus brevis (EPB), and abductor pollicus longus (APL). Each of the four tendons has a covering, called a sheath. The tendon sheaths secret a fluid, which lubricates the tendons. This allows them to move smoothly. However, these sheaths have a tendency to become inflamed. That causes the tendons to stop gliding smoothly. This is why intersection syndrome may make it painful to extend the wrist or thumb. SYMPTOMS   Pain, tenderness, swelling, warmth, or redness, about 2 inches up the arm from the wrist.  Pain that gets worse when extending the wrist or thumb.  Limited motion of the thumb or wrist.  Crackling sound (crepitation), when the tendon or wrist is moved or touched. CAUSES   Intersection syndrome is most often an overuse injury, caused by repeated motions of the hand and wrist.  Sudden increase in activity or change in activity. RISK INCREASES WITH:  Sports that involve repeated hand and wrist motions (rowing, weightlifting, racquet sports).  Heavy labor.  Poor hand and forearm strength and flexibility.  Failure to warm up properly before activity. PREVENTION   Warm up and stretch properly before activity.  Allow time for rest and recovery of the wrist, when it becomes painful.  Maintain wrist and forearm fitness:  Forearm, wrist, and hand flexibility.  Muscle strength and endurance.  Use proper exercise technique. PROGNOSIS  Intersection syndrome is usually curable, if treated properly. Rest and recovery may take up to 6 weeks.  RELATED COMPLICATIONS   Longer healing  time, if not properly treated, or if not given enough time to heal.  Chronic inflammation of the tendon sheaths. This causes persistent symptoms, and possible rupture of one or more tendons.  Recurring symptoms, especially if activity is resumed too soon.  Risks of surgery: infection, bleeding, injury to nerves, continued pain, incomplete release of the tendon sheath, recurring symptoms, cutting the tendons, tendons sliding out of position, and weakness of the wrist or thumb. TREATMENT  Treatment first involves ice and medicine, to reduce pain and inflammation. You may be advised to begin strengthening and stretching exercises. These can be done at home, or with a therapist. If possible, it is helpful to modify any aggravating activity, if the modification results in less severe symptoms. Your caregiver may recommend restraint with a cast, splint, or brace, to reduce friction within the tendon sheaths. Your caregiver may recommend a corticosteroid injection to reduce inflammation. Surgery may be needed, to release the tendons. MEDICATION   If pain medicine is needed, nonsteroidal anti-inflammatory medicines (aspirin and ibuprofen), or other minor pain relievers (acetaminophen), are often advised.  Do not take pain medicine for 7 days before surgery.  Prescription pain relievers are usually prescribed only after surgery. Use only as directed and only  as much as you need.  Corticosteroid injections are used in extreme cases, to reduce inflammation. These injections should be given only if necessary, because they may only be given a limited number of times. COLD THERAPY   Cold treatment (icing) should be applied for 10 to 15 minutes every 2 to 3 hours for inflammation and pain, and immediately after activity that aggravates your symptoms. Use ice packs or an ice massage. SEEK MEDICAL CARE IF:   Symptoms get worse or do not improve in 2 to 4 weeks, despite treatment.  You experience pain,  numbness, or coldness in the hand.  Blue, gray, or dark color appears in the fingernails.  Any of the following occur after surgery: increased pain, swelling, redness, drainage of fluids, bleeding in the affected area, or signs of infection.  New, unexplained symptoms develop. (Drugs used in treatment may produce side effects.) Document Released: 08/19/2005 Document Revised: 11/11/2011 Document Reviewed: 12/01/2008 [facility] Patient Information 2015 [place]Care, [place]. This information is not intended to replace advice given to you by your health care provider. Make sure you discuss any questions you have with your health care provider.

## 2014-07-29 ENCOUNTER — Emergency Department (HOSPITAL_COMMUNITY)
Admission: EM | Admit: 2014-07-29 | Discharge: 2014-07-29 | Disposition: A | Discharge status: Home / Self Care | Payer: Self-pay | Attending: Emergency Medicine | Admitting: Emergency Medicine

## 2014-07-29 ENCOUNTER — Emergency Department (HOSPITAL_COMMUNITY): Payer: Self-pay

## 2014-07-29 DIAGNOSIS — Z791 Long term (current) use of non-steroidal anti-inflammatories (NSAID): Secondary | ICD-10-CM | POA: Insufficient documentation

## 2014-07-29 DIAGNOSIS — Z720 Tobacco use: Secondary | ICD-10-CM | POA: Insufficient documentation

## 2014-07-29 DIAGNOSIS — Z87440 Personal history of urinary (tract) infections: Secondary | ICD-10-CM | POA: Insufficient documentation

## 2014-07-29 DIAGNOSIS — Z7952 Long term (current) use of systemic steroids: Secondary | ICD-10-CM | POA: Insufficient documentation

## 2014-07-29 DIAGNOSIS — M25532 Pain in left wrist: Secondary | ICD-10-CM | POA: Insufficient documentation

## 2014-07-29 MED ORDER — PREDNISONE 20 MG PO TABS: 40.0000 mg | ORAL_TABLET | Freq: Every day | ORAL | Status: DC

## 2014-07-29 NOTE — ED Provider Notes (Signed)
CSN: [id number]     Arrival date & time 07/29/14  0845 History   First MD Initiated Contact with Patient 07/29/14 [phone]     Chief Complaint  Patient presents with  . Wrist Pain     (Consider location/radiation/quality/duration/timing/severity/associated sxs/prior Treatment) HPI Melanie Strong is a 28 y.o. female with no significant past medical history who comes in for evaluation of wrist pain. She reports having injured her wrist over a month ago following lifting heavy furniture at her job. She was evaluated last month at an urgent care facility for the same injury, was diagnosed with tendinitis and was given NSAIDs as well as a wrist brace as initial therapy. Patient states she has been compliant with original prescribed therapy, but wrist has not improved. Today she reports pain on the medial aspect of her left wrist. No other modifying factors She denies any fevers, joint swelling, numbness, tingling or weakness.  Past Medical History  Diagnosis Date  . Glucose found in urine on examination   . No pertinent past medical history   . Infection     UTI   Past Surgical History  Procedure Laterality Date  . No past surgeries     Family History  Problem Relation Age of Onset  . Other Neg Hx   . Hypertension Mother   . Cancer Paternal Aunt   . Diabetes Maternal Grandmother   . Hypertension Maternal Grandmother    History  Substance Use Topics  . Smoking status: Current Every Day Smoker -- 0.50 packs/day for 10 years    Types: Cigarettes  . Smokeless tobacco: Never Used  . Alcohol Use: Yes     Comment: occasionally   OB History    Gravida Para Term Preterm AB TAB SAB Ectopic Multiple Living   0              Review of Systems  Constitutional: Negative for fever.  HENT: Negative for sore throat.   Eyes: Negative for visual disturbance.  Respiratory: Negative for shortness of breath.   Cardiovascular: Negative for chest pain.  Gastrointestinal: Negative for abdominal  pain.  Endocrine: Negative for polyuria.  Genitourinary: Negative for dysuria.  Musculoskeletal: Positive for myalgias and arthralgias.  Skin: Negative for rash.  Neurological: Negative for headaches.      Allergies  Review of patient's allergies indicates no known allergies.  Home Medications   Prior to Admission medications   Medication Sig Start Date End Date Taking? Authorizing Provider  diphenhydrAMINE (SOMINEX) 25 MG tablet Take 25 mg by mouth at bedtime as needed for allergies or sleep.   Yes Historical Provider, MD  meloxicam (MOBIC) 15 MG tablet Take 1 tablet (15 mg total) by mouth daily. 06/30/14  Yes Graylon GoodZachary H Baker, PA-C  traMADol (ULTRAM) 50 MG tablet Take 1 tablet (50 mg total) by mouth every 6 (six) hours as needed. 06/30/14  Yes Adrian BlackwaterZachary H Baker, PA-C  predniSONE (DELTASONE) 20 MG tablet Take 2 tablets (40 mg total) by mouth daily. 07/29/14   Earle GellBenjamin W Terron Merfeld, PA-C   BP 121/75 mmHg  Pulse 77  Temp(Src) 98.2 F (36.8 C) (Oral)  Resp 16  SpO2 100%  LMP 07/15/2014 Physical Exam  Constitutional: She is oriented to person, place, and time. She appears well-developed and well-nourished.  HENT:  Head: Normocephalic and atraumatic.  Mouth/Throat: Oropharynx is clear and moist.  Eyes: Conjunctivae are normal. Pupils are equal, round, and reactive to light. Right eye exhibits no discharge. Left eye exhibits no discharge. No  scleral icterus.  Neck: Neck supple.  Cardiovascular: Normal rate, regular rhythm and normal heart sounds.   Pulmonary/Chest: Effort normal and breath sounds normal. No respiratory distress. She has no wheezes. She has no rales.  Abdominal: Soft. There is no tenderness.  Musculoskeletal: She exhibits no tenderness.  Tenderness diffusely over medial aspect of left wrist along the ulnar head. Motor and sensation 5/5. ROM baseline. Neurovascularly intact. Cap refill less than 2 seconds. No other obvious lesions, rashes or deformities appreciated   Neurological: She is alert and oriented to person, place, and time.  Cranial Nerves II-XII grossly intact  Skin: Skin is warm and dry. No rash noted.  Psychiatric: She has a normal mood and affect.  Nursing note and vitals reviewed.   ED Course  Procedures (including critical care time) Labs Review Labs Reviewed - No data to display  Imaging Review Dg Wrist Complete Left  07/29/2014   CLINICAL DATA:  Left wrist pain and tenderness for the past 2 months.  EXAM: LEFT WRIST - COMPLETE 3+ VIEW  COMPARISON:  Left ring finger dated 06/20/2014.  FINDINGS: There is no evidence of fracture or dislocation. There is no evidence of arthropathy or other focal bone abnormality. Soft tissues are unremarkable.  IMPRESSION: Normal examination.   Electronically Signed   By: Gordan PaymentSteve  Reid M.D.   On: 07/29/2014 10:05     EKG Interpretation None      MDM  Vitals stable - WNL -afebrile Pt resting comfortably in ED. PE--not concerning further acute or emergent pathology. No evidence of neurovascular injury.  Imaging--x-ray shows no acute fractures, dislocations or other osseous abnormalities of left wrist.  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 We will administer short course steroid as patient has already tried Mobic w/o relief. Encouraged wearing a brace at work but removing during sleep to reduce discomfort associated with range of motion. Patient may continue taking tramadol at home. If no improvement patient may need to follow-up with orthopedist.  Discussed f/u with PCP and return precautions, pt very amenable to plan.    Final diagnoses:  Wrist pain, left        Sharlene MottsBenjamin W Aniston Christman, PA-C 07/29/14 1057  Dione Boozeavid Glick, MD 07/29/14 [phone]

## 2014-07-29 NOTE — ED Notes (Signed)
Pt reports 2 months ago she hurt L wrist lifting heavy object. Went to urgent care, was diagnosed with tendonitis. Has been wearing wrist brace since then. Has developed pain over lateral wrist over bone. Pain with moving wrist up and down. Able to move fingers.

## 2014-07-29 NOTE — Discharge Instructions (Signed)
You were evaluated today in the ED for your wrist pain. There is no evidence of a broken bone or dislocation. We cannot find an emergent cause for your wrist pain. Please continue to wear your brace during activity and removing it at rest to increased range of motion. Please follow-up with your primary care within 3-5 days for further evaluation and management. Return to ED if you experience worsening symptoms.

## 2014-08-08 ENCOUNTER — Encounter (HOSPITAL_COMMUNITY): Payer: Self-pay

## 2014-08-08 ENCOUNTER — Inpatient Hospital Stay (HOSPITAL_COMMUNITY)
Admission: AD | Admit: 2014-08-08 | Discharge: 2014-08-08 | Disposition: A | Discharge status: Home / Self Care | Payer: Self-pay | Source: Ambulatory Visit | Attending: Family Medicine | Admitting: Family Medicine

## 2014-08-08 DIAGNOSIS — B372 Candidiasis of skin and nail: Secondary | ICD-10-CM

## 2014-08-08 DIAGNOSIS — L270 Generalized skin eruption due to drugs and medicaments taken internally: Secondary | ICD-10-CM

## 2014-08-08 DIAGNOSIS — T50915A Adverse effect of multiple unspecified drugs, medicaments and biological substances, initial encounter: Secondary | ICD-10-CM

## 2014-08-08 DIAGNOSIS — F17210 Nicotine dependence, cigarettes, uncomplicated: Secondary | ICD-10-CM | POA: Insufficient documentation

## 2014-08-08 LAB — POCT PREGNANCY, URINE: Preg Test, Ur: NEGATIVE

## 2014-08-08 MED ORDER — NYSTATIN-TRIAMCINOLONE 100000-0.1 UNIT/GM-% EX OINT: 1.0000 "application " | TOPICAL_OINTMENT | Freq: Two times a day (BID) | CUTANEOUS | Status: DC

## 2014-08-08 NOTE — MAU Note (Signed)
Pt reports she has vaginal itching and irritation for the last 3 days.

## 2014-08-08 NOTE — MAU Provider Note (Signed)
CC: Vaginal Itching    First Provider Initiated Contact with Patient 08/08/14 2313      HPI Melanie Strong is a 28 y.o. G0P0 who presents with Onset 4 days ago after washing bathtub with Comet cleanser of pruritic area in crease of buttock and over mons pubis. She also has used a new bubble bath at the same time and has been self treating with Desitin, hydrocortisone cream and Benadryl. The areas have improved but still having pruritus. She is also begun growing her hair back and shaved area over mons pubis. Denies any irritative vaginal discharge.Declines STI testing and has been in a mutually monogamous relationship for 9 years.  Past Medical History  Diagnosis Date  . Glucose found in urine on examination   . No pertinent past medical history   . Infection     UTI    OB History  Gravida Para Term Preterm AB SAB TAB Ectopic Multiple Living  0                 Past Surgical History  Procedure Laterality Date  . No past surgeries      History   Social History  . Marital Status: Single    Spouse Name: N/A    Number of Children: N/A  . Years of Education: N/A   Occupational History  . Not on file.   Social History Main Topics  . Smoking status: Current Every Day Smoker -- 0.50 packs/day for 10 years    Types: Cigarettes  . Smokeless tobacco: Never Used  . Alcohol Use: Yes     Comment: occasionally  . Drug Use: No  . Sexual Activity: Yes    Birth Control/ Protection: None   Other Topics Concern  . Not on file   Social History Narrative    No current facility-administered medications on file prior to encounter.   Current Outpatient Prescriptions on File Prior to Encounter  Medication Sig Dispense Refill  . diphenhydrAMINE (SOMINEX) 25 MG tablet Take 25 mg by mouth at bedtime as needed for allergies or sleep.    . meloxicam (MOBIC) 15 MG tablet Take 1 tablet (15 mg total) by mouth daily. (Patient not taking: Reported on 08/08/2014) 30 tablet 1  . predniSONE  (DELTASONE) 20 MG tablet Take 2 tablets (40 mg total) by mouth daily. (Patient not taking: Reported on 08/08/2014) 10 tablet 0  . traMADol (ULTRAM) 50 MG tablet Take 1 tablet (50 mg total) by mouth every 6 (six) hours as needed. (Patient not taking: Reported on 08/08/2014) 15 tablet 0    No Known Allergies  ROS Pertinent items in HPI  PHYSICAL EXAM Filed Vitals:   08/08/14 2138  BP: 124/83  Pulse: 77  Temp: 98.8 F (37.1 C)  Resp: 18   General: Well nourished, well developed female in no acute distress Cardiovascular: Normal rate Respiratory: Normal effort Abdomen: Soft, nontender Back: No CVAT Extremities: No edema Neurologic: Alert and oriented Buttocks: 3 cm shiny slightly tender erythematous skin in buttock crease; skin intact Mons pubis: pubic hair growing in over minimally erythematous base; single 2 mm healed scab, no other lesions  LAB RESULTS Results for orders placed or performed during the hospital encounter of 08/08/14 (from the past 24 hour(s))  Pregnancy, urine POC     Status: None   Collection Time: 08/08/14 10:04 PM  Result Value Ref Range   Preg Test, Ur NEGATIVE NEGATIVE    IMAGING Dg Wrist Complete Left  07/29/2014   CLINICAL  DATA:  Left wrist pain and tenderness for the past 2 months.  EXAM: LEFT WRIST - COMPLETE 3+ VIEW  COMPARISON:  Left ring finger dated 06/20/2014.  FINDINGS: There is no evidence of fracture or dislocation. There is no evidence of arthropathy or other focal bone abnormality. Soft tissues are unremarkable.  IMPRESSION: Normal examination.   Electronically Signed   By: Gordan PaymentSteve  Reid M.D.   On: 07/29/2014 10:05    MAU COURSE   ASSESSMENT  1. Yeast dermatitis   2. 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 See AVS for patient education.    Medication List    STOP taking these medications        liver oil-zinc oxide 40 % ointment  Commonly known as:  DESITIN     meloxicam 15 MG tablet  Commonly  known as:  MOBIC     predniSONE 20 MG tablet  Commonly known as:  DELTASONE     traMADol 50 MG tablet  Commonly known as:  ULTRAM      TAKE these medications        diphenhydrAMINE 25 MG tablet  Commonly known as:  SOMINEX  Take 25 mg by mouth at bedtime as needed for allergies or sleep.     hydrocortisone cream 1 %  Apply 1 application topically 4 (four) times daily as needed for itching.     nystatin-triamcinolone ointment  Commonly known as:  MYCOLOG  Apply 1 application topically 2 (two) times daily.           Danae Orleanseirdre C Lynnox Girten, CNM 08/08/2014 11:26 PM

## 2014-12-05 ENCOUNTER — Encounter (HOSPITAL_COMMUNITY): Payer: Self-pay | Admitting: *Deleted

## 2014-12-05 ENCOUNTER — Emergency Department (INDEPENDENT_AMBULATORY_CARE_PROVIDER_SITE_OTHER)
Admission: EM | Admit: 2014-12-05 | Discharge: 2014-12-05 | Disposition: A | Discharge status: Home / Self Care | Payer: Self-pay | Source: Home / Self Care | Attending: Family Medicine | Admitting: Family Medicine

## 2014-12-05 DIAGNOSIS — J989 Respiratory disorder, unspecified: Secondary | ICD-10-CM

## 2014-12-05 DIAGNOSIS — M940 Chondrocostal junction syndrome [Tietze]: Secondary | ICD-10-CM

## 2014-12-05 DIAGNOSIS — F419 Anxiety disorder, unspecified: Secondary | ICD-10-CM

## 2014-12-05 DIAGNOSIS — J301 Allergic rhinitis due to pollen: Secondary | ICD-10-CM

## 2014-12-05 DIAGNOSIS — F458 Other somatoform disorders: Secondary | ICD-10-CM

## 2014-12-05 LAB — POCT RAPID STREP A: STREPTOCOCCUS, GROUP A SCREEN (DIRECT): NEGATIVE

## 2014-12-05 NOTE — ED Provider Notes (Signed)
CSN: [id number]     Arrival date & time 12/05/14  0804 History   First MD Initiated Contact with Patient 12/05/14 [phone]     Chief Complaint  Patient presents with  . Chest Pain  . Cough  . Sore Throat   (Consider location/radiation/quality/duration/timing/severity/associated sxs/prior Treatment) HPI Comments: 29 year old female presents with headache, sore throat and peristernal chest pain for 2 days. Is associated with nasal stuffiness, sniffles and runny nose. Denies any known fevers. Upon arrival the nurse noted that she was tearful, appearing anxious and breathing rapidly.   Past Medical History  Diagnosis Date  . Glucose found in urine on examination   . No pertinent past medical history   . Infection     UTI   Past Surgical History  Procedure Laterality Date  . No past surgeries     Family History  Problem Relation Age of Onset  . Other Neg Hx   . Hypertension Mother   . Cancer Paternal Aunt   . Diabetes Maternal Grandmother   . Hypertension Maternal Grandmother    History  Substance Use Topics  . Smoking status: Current Every Day Smoker -- 0.50 packs/day for 10 years    Types: Cigarettes  . Smokeless tobacco: Never Used  . Alcohol Use: Yes     Comment: occasionally   OB History    Gravida Para Term Preterm AB TAB SAB Ectopic Multiple Living   0              Review of Systems  Constitutional: Positive for activity change. Negative for fever.  HENT: Positive for congestion, postnasal drip, rhinorrhea and sore throat. Negative for ear pain and trouble swallowing.   Respiratory: Positive for cough. Negative for chest tightness, shortness of breath and wheezing.   Cardiovascular: Positive for chest pain. Negative for leg swelling.  Gastrointestinal: Negative.   Genitourinary: Negative.   Musculoskeletal: Negative.   Skin: Negative.   Neurological: Positive for headaches. Negative for dizziness, tremors, seizures, syncope and speech difficulty.    Allergies   Review of patient's allergies indicates no known allergies.  Home Medications   Prior to Admission medications   Medication Sig Start Date End Date Taking? Authorizing Provider  diphenhydrAMINE (SOMINEX) 25 MG tablet Take 25 mg by mouth at bedtime as needed for allergies or sleep.    Historical Provider, MD  hydrocortisone cream 1 % Apply 1 application topically 4 (four) times daily as needed for itching.    Historical Provider, MD  nystatin-triamcinolone ointment (MYCOLOG) Apply 1 application topically 2 (two) times daily. 08/08/14   Deirdre C Poe, CNM   BP 123/83 mmHg  Pulse 71  Temp(Src) 98.9 F (37.2 C) (Oral)  Resp 48  SpO2 100%  LMP 11/08/2014 Physical Exam  Constitutional: She is oriented to person, place, and time. She appears well-developed and well-nourished. No distress.  HENT:  Mouth/Throat: No oropharyngeal exudate.  Bilateral TMs are normal Oropharynx erythematous with mildly enlarged red tonsils and mild to moderate clear PND.  Eyes: Conjunctivae and EOM are normal.  Neck: Normal range of motion. Neck supple.  Cardiovascular: Normal rate, regular rhythm, normal heart sounds and intact distal pulses.   Pulmonary/Chest: She has no wheezes. She has no rales. She exhibits tenderness.  Hyperventilating at 50 times per minute Lungs are perfectly clear, moving air freely. No adventitious sounds.  Marked, reproducible tenderness to the parasternal borders. Palpation of these areas reproduce the same chest pain for which she originally complaints.  Abdominal: Soft. There is no  tenderness.  Lymphadenopathy:    She has no cervical adenopathy.  Neurological: She is alert and oriented to person, place, and time.  Skin: Skin is warm and dry.  Psychiatric: She has a normal mood and affect.  Nursing note and vitals reviewed.   ED Course  Procedures (including critical care time) Labs Review Labs Reviewed  POCT RAPID STREP A ([facility] ONLY)   Results for orders placed  or performed during the hospital encounter of 12/05/14  POCT rapid strep A [facility])  Result Value Ref Range   Streptococcus, Group A Screen (Direct) NEGATIVE NEGATIVE    Imaging Review No results found.   MDM   1. Allergic rhinitis due to pollen   2. Anxiety hyperventilation   3. Costochondritis     Having patient breathes and non-rebreathable O2 bag. After observation of 3 breathing the patient states she is feeling much better. Her respirations are even and nonlabored 20 upon discharge. Allergic Rhinitis For allergies and drainage may use either Claritin, Allegra or Zyrtec. Also use either Rhinocort nasal spray or Flonase nasal spray If you have nasal congestion take Sudafed PE 10 mg every 4 hours. Use lots of saline nasal spray for nasal congestion Robitussin-DM for cough if needed Ibuprofen 400-600 mg every 6-8 hours as needed for chest wall pain     Hayden Rasmussen, NP 12/05/14 [phone]

## 2014-12-05 NOTE — ED Notes (Signed)
Pt is here with complaints of chest pain, cough, and sore throat since Saturday. Pt is tearful, heavy breathing. O2 Sat 100% on RA. Pt encouraged to take slow, deep breaths.

## 2014-12-05 NOTE — Discharge Instructions (Signed)
Allergic Rhinitis For allergies and drainage may use either Claritin, Allegra or Zyrtec. Also use either Rhinocort nasal spray or Flonase nasal spray If you have nasal congestion take Sudafed PE 10 mg every 4 hours. Use lots of saline nasal spray for nasal congestion Robitussin-DM for cough if needed Ibuprofen 400-600 mg every 6-8 hours as needed for chest wall pain Allergic rhinitis is when the mucous membranes in the nose respond to allergens. Allergens are particles in the air that cause your body to have an allergic reaction. This causes you to release allergic antibodies. Through a chain of events, these eventually cause you to release histamine into the blood stream. Although meant to protect the body, it is this release of histamine that causes your discomfort, such as frequent sneezing, congestion, and an itchy, runny nose.  CAUSES  Seasonal allergic rhinitis (hay fever) is caused by pollen allergens that may come from grasses, trees, and weeds. Year-round allergic rhinitis (perennial allergic rhinitis) is caused by allergens such as house dust mites, pet dander, and mold spores.  SYMPTOMS   Nasal stuffiness (congestion).  Itchy, runny nose with sneezing and tearing of the eyes. DIAGNOSIS  Your health care provider can help you determine the allergen or allergens that trigger your symptoms. If you and your health care provider are unable to determine the allergen, skin or blood testing may be used. TREATMENT  Allergic rhinitis does not have a cure, but it can be controlled by:  Medicines and allergy shots (immunotherapy).  Avoiding the allergen. Hay fever may often be treated with antihistamines in pill or nasal spray forms. Antihistamines block the effects of histamine. There are over-the-counter medicines that may help with nasal congestion and swelling around the eyes. Check with your health care provider before taking or giving this medicine.  If avoiding the allergen or the  medicine prescribed do not work, there are many new medicines your health care provider can prescribe. Stronger medicine may be used if initial measures are ineffective. Desensitizing injections can be used if medicine and avoidance does not work. Desensitization is when a patient is given ongoing shots until the body becomes less sensitive to the allergen. Make sure you follow up with your health care provider if problems continue. HOME CARE INSTRUCTIONS It is not possible to completely avoid allergens, but you can reduce your symptoms by taking steps to limit your exposure to them. It helps to know exactly what you are allergic to so that you can avoid your specific triggers. SEEK MEDICAL CARE IF:   You have a fever.  You develop a cough that does not stop easily (persistent).  You have shortness of breath.  You start wheezing.  Symptoms interfere with normal daily activities. Document Released: 05/14/2001 Document Revised: 08/24/2013 Document Reviewed: 04/26/2013 [facility] Patient Information 2015 [place], [place]. This information is not intended to replace advice given to you by your health care provider. Make sure you discuss any questions you have with your health care provider.  Costochondritis Costochondritis is a condition in which the tissue (cartilage) that connects your ribs with your breastbone (sternum) becomes irritated. It causes pain in the chest and rib area. It usually goes away on its own over time. HOME CARE  Avoid activities that wear you out.  Do not strain your ribs. Avoid activities that use your:  Chest.  Belly.  Side muscles.  Put ice on the area for the first 2 days after the pain starts.  Put ice in a plastic bag.  Place a towel between your skin and the bag.  Leave the ice on for 20 minutes, 2-3 times a day.  Only take medicine as told by your doctor. GET HELP IF:  You have redness or puffiness (swelling) in the rib area.  Your pain does not go  away with rest or medicine. GET HELP RIGHT AWAY IF:   Your pain gets worse.  You are very uncomfortable.  You have trouble breathing.  You cough up blood.  You start sweating or throwing up (vomiting).  You have a fever or lasting symptoms for more than 2-3 days.  You have a fever and your symptoms suddenly get worse. MAKE SURE YOU:   Understand these instructions.  Will watch your condition.  Will get help right away if you are not doing well or get worse. Document Released: 02/05/2008 Document Revised: 04/21/2013 Document Reviewed: 03/23/2013 [facility] Patient Information 2015 [place]. This information is not intended to replace advice given to you by your health care provider. Make sure you discuss any questions you have with your health care provider.  Hyperventilation Hyperventilation is breathing that is deeper and more rapid than normal. It is usually associated with panic and anxiety. Hyperventilation can make you feel breathless. It is sometimes called overbreathing. Breathing out too much causes a decrease in the amount of carbon dioxide gas in the blood. This leads to tingling and numbness in the hands, feet, and around the mouth. If this continues, your fingers, hands, and toes may begin to spasm. Hyperventilation usually lasts 20-30 minutes and can be associated with other symptoms of panic and anxiety, including:   Chest pains or tightness.  A pounding or irregular, racing heartbeat (palpitations).  Dizziness.  Lightheadedness.  Dry mouth.  Weakness.  Confusion.  Sleep disturbance. CAUSES  Sudden onset (acute) hyperventilation is usually triggered by acute stress, anxiety, or emotional upset. Long-term (chronic) and recurring hyperventilation can occur with chronic lung problems, such emphysema or asthma. Other causes include:   Nervousness.  Stress.  Stimulant, drug, or alcohol use.  Lung disease.  Infections, such as pneumonia.  Heart  problems.  Severe pain.  Waking from a bad dream.  Pregnancy.  Bleeding. HOME CARE INSTRUCTIONS  Learn and use breathing exercises that help you breathe from your diaphragm and abdomen.  Practice relaxation techniques to reduce stress, such as visualization, meditation, and muscle release.  During an attack, try breathing into a paper bag. This changes the carbon dioxide level and slows down breathing. SEEK IMMEDIATE MEDICAL CARE IF:  Your hyperventilation continues or gets worse. MAKE SURE YOU:  Understand these instructions.  Will watch your condition.  Will get help right away if you are not doing well or get worse. Document Released: 08/16/2000 Document Revised: 02/18/2012 Document Reviewed: 11/28/2011 [facility] Patient Information 2015 [place]. This information is not intended to replace advice given to you by your health care provider. Make sure you discuss any questions you have with your health care provider.

## 2014-12-05 NOTE — ED Notes (Signed)
0920 Non rebreather applied without O2 per Hayden Rasmussenavid Mabe. Pt coached to slow breathing. RR decreased to 20 per minute. O2 sat 100% on RA.

## 2014-12-05 NOTE — ED Notes (Signed)
RR=18. No signs of distress. Resp even and non labored. O2 sat 100% on ra.

## 2014-12-06 ENCOUNTER — Emergency Department (HOSPITAL_COMMUNITY)
Admission: EM | Admit: 2014-12-06 | Discharge: 2014-12-06 | Disposition: A | Discharge status: Home / Self Care | Payer: Medicaid Other | Attending: Emergency Medicine | Admitting: Emergency Medicine

## 2014-12-06 ENCOUNTER — Encounter (HOSPITAL_COMMUNITY): Payer: Self-pay

## 2014-12-06 DIAGNOSIS — Z720 Tobacco use: Secondary | ICD-10-CM | POA: Insufficient documentation

## 2014-12-06 DIAGNOSIS — Z79899 Other long term (current) drug therapy: Secondary | ICD-10-CM | POA: Insufficient documentation

## 2014-12-06 DIAGNOSIS — Z87440 Personal history of urinary (tract) infections: Secondary | ICD-10-CM | POA: Insufficient documentation

## 2014-12-06 DIAGNOSIS — B349 Viral infection, unspecified: Secondary | ICD-10-CM

## 2014-12-06 MED ORDER — ALBUTEROL SULFATE HFA 108 (90 BASE) MCG/ACT IN AERS
2.0000 | INHALATION_SPRAY | RESPIRATORY_TRACT | Status: DC | PRN
Start: 2014-12-06 — End: 2014-12-06
  Administered 2014-12-06: 2 via RESPIRATORY_TRACT
  Filled 2014-12-06: qty 6.7

## 2014-12-06 MED ORDER — PREDNISONE 20 MG PO TABS: 40.0000 mg | ORAL_TABLET | Freq: Every day | ORAL | Status: DC

## 2014-12-06 NOTE — ED Notes (Addendum)
Onset 12-02-14 sore throat, cough, runny nose, post nasal drip, headache, chest pain.  Pt seen at urgent care yesterday dx: allergic rhinitis d/t pollen, costochondritis, anxiety.  Was advised to get Robitussin, saline nose drops, and allergy med.  Pt started Claritin yesterday.  Pt is not better today.  Feels the same way she did at urgent care yesterday.  Pt very anxious.  Pt has mid chest pain only when coughing.  Pt talking in complete sentences.

## 2014-12-06 NOTE — Discharge Instructions (Signed)

## 2014-12-06 NOTE — ED Provider Notes (Signed)
CSN: [id number]     Arrival date & time 12/06/14  1158 History   First MD Initiated Contact with Patient 12/06/14 1223     Chief Complaint  Patient presents with  . Allergies  . Cough  . Chest Pain     (Consider location/radiation/quality/duration/timing/severity/associated sxs/prior Treatment) HPI Comments: Patient presents to the emergency department with chief complaint of sore throat, cough, runny nose, headache, and chest pain when she coughs. She was seen yesterday at urgent care diagnosed with allergic rhinitis. She was prescribed Claritin. She states that she has been taking the medicine with no relief. She denies any fevers, or chills. Denies any nausea, vomiting, or diarrhea. There are no aggravating or alleviating factors. Patient presents for second opinion.  The history is provided by the patient. No language interpreter was used.    Past Medical History  Diagnosis Date  . Glucose found in urine on examination   . No pertinent past medical history   . Infection     UTI   Past Surgical History  Procedure Laterality Date  . No past surgeries     Family History  Problem Relation Age of Onset  . Other Neg Hx   . Hypertension Mother   . Cancer Paternal Aunt   . Diabetes Maternal Grandmother   . Hypertension Maternal Grandmother    History  Substance Use Topics  . Smoking status: Current Every Day Smoker -- 0.50 packs/day for 10 years    Types: Cigarettes  . Smokeless tobacco: Never Used  . Alcohol Use: Yes     Comment: occasionally   OB History    Gravida Para Term Preterm AB TAB SAB Ectopic Multiple Living   0              Review of Systems  Constitutional: Negative for fever and chills.  HENT: Positive for postnasal drip, rhinorrhea, sinus pressure, sneezing and sore throat.   Respiratory: Positive for cough. Negative for shortness of breath.   Cardiovascular: Negative for chest pain.  Gastrointestinal: Negative for nausea, vomiting, abdominal pain,  diarrhea and constipation.  Genitourinary: Negative for dysuria.      Allergies  Review of patient's allergies indicates no known allergies.  Home Medications   Prior to Admission medications   Medication Sig Start Date End Date Taking? Authorizing Provider  guaiFENesin-dextromethorphan (ROBITUSSIN DM) 100-10 MG/5ML syrup Take 5 mLs by mouth every 4 (four) hours as needed for cough.   Yes Historical Provider, MD  loratadine (CLARITIN) 10 MG tablet Take 10 mg by mouth daily.   Yes Historical Provider, MD   BP 138/72 mmHg  Temp(Src) 97.6 F (36.4 C) (Oral)  Resp 32  Ht 5\' 4"  (1.626 m)  Wt 125 lb 14.4 oz (57.108 kg)  BMI 21.60 kg/m2  SpO2 100%  LMP 11/08/2014 Physical Exam  Constitutional: She appears well-developed and well-nourished. No distress.  HENT:  Head: Normocephalic.  Right Ear: External ear normal.  Left Ear: External ear normal.  Mildly erythematous, no tonsillar exudate, no abscess, no stridor, uvula is midline  TMs clear bilaterally  Eyes: Conjunctivae and EOM are normal. Pupils are equal, round, and reactive to light.  Neck: Normal range of motion. Neck supple.  Cardiovascular: Normal rate, regular rhythm and normal heart sounds.  Exam reveals no gallop and no friction rub.   No murmur heard. Pulmonary/Chest: Effort normal and breath sounds normal. No stridor. No respiratory distress. She has no wheezes. She has no rales. She exhibits no tenderness.  CTAB  Abdominal: Soft. Bowel sounds are normal. She exhibits no distension. There is no tenderness.  Musculoskeletal: Normal range of motion. She exhibits no tenderness.  Neurological: She is alert.  Skin: Skin is warm and dry. No rash noted. She is not diaphoretic.  Psychiatric: She has a normal mood and affect. Her behavior is normal. Judgment and thought content normal.  Nursing note and vitals reviewed.   ED Course  Procedures (including critical care time) Labs Review Labs Reviewed - No data to  display  Imaging Review No results found.   EKG Interpretation None      MDM   Final diagnoses:  Viral syndrome    Patient with viral syndrome, doubt influenza, no fever, doubt pneumonia, lung sounds are clear. She is very well-appearing. She is not in any apparent distress. Will prescribe some prednisone and an inhaler. Recommend primary care follow-up. Patient understands and agrees with the plan. She is stable and ready for discharge.  Filed Vitals:   12/06/14 1403  BP: 124/80  Pulse: 73  Temp:   Resp: 20      Voncile Schwarz, PA-C 12/06/14 1528  Layla Maw Ward, DO 12/06/14 [phone]

## 2014-12-07 LAB — CULTURE, GROUP A STREP

## 2014-12-08 ENCOUNTER — Telehealth (HOSPITAL_COMMUNITY): Payer: Self-pay | Admitting: Family Medicine

## 2014-12-08 MED ORDER — AMOXICILLIN 500 MG PO CAPS: 500.0000 mg | ORAL_CAPSULE | Freq: Three times a day (TID) | ORAL | Status: DC

## 2014-12-08 NOTE — ED Notes (Signed)
Throat culture positive.  Pt not better.  Amox called in.  Pt notified.    Rodolph BongEvan S Vanshika Jastrzebski, MD 12/08/14 [phone]

## 2014-12-24 ENCOUNTER — Emergency Department (HOSPITAL_COMMUNITY)
Admission: EM | Admit: 2014-12-24 | Discharge: 2014-12-24 | Disposition: A | Discharge status: Home / Self Care | Payer: Medicaid Other | Attending: Emergency Medicine | Admitting: Emergency Medicine

## 2014-12-24 ENCOUNTER — Encounter (HOSPITAL_COMMUNITY): Payer: Self-pay

## 2014-12-24 ENCOUNTER — Emergency Department (HOSPITAL_COMMUNITY): Payer: Medicaid Other

## 2014-12-24 DIAGNOSIS — J40 Bronchitis, not specified as acute or chronic: Secondary | ICD-10-CM

## 2014-12-24 DIAGNOSIS — Z7952 Long term (current) use of systemic steroids: Secondary | ICD-10-CM | POA: Insufficient documentation

## 2014-12-24 DIAGNOSIS — Z720 Tobacco use: Secondary | ICD-10-CM | POA: Insufficient documentation

## 2014-12-24 DIAGNOSIS — J209 Acute bronchitis, unspecified: Secondary | ICD-10-CM | POA: Insufficient documentation

## 2014-12-24 DIAGNOSIS — Z87440 Personal history of urinary (tract) infections: Secondary | ICD-10-CM | POA: Insufficient documentation

## 2014-12-24 DIAGNOSIS — R059 Cough, unspecified: Secondary | ICD-10-CM

## 2014-12-24 DIAGNOSIS — Z79899 Other long term (current) drug therapy: Secondary | ICD-10-CM | POA: Insufficient documentation

## 2014-12-24 DIAGNOSIS — Z792 Long term (current) use of antibiotics: Secondary | ICD-10-CM | POA: Insufficient documentation

## 2014-12-24 DIAGNOSIS — R05 Cough: Secondary | ICD-10-CM

## 2014-12-24 MED ORDER — AZITHROMYCIN 250 MG PO TABS: ORAL_TABLET | ORAL | Status: DC

## 2014-12-24 NOTE — ED Notes (Signed)
Onset this morning non productive cough.  Pt was at work today and got short of breath with exertion.   Pt started with fever this evening.  Pt talking in complete sentences, NAD.

## 2014-12-24 NOTE — Discharge Instructions (Signed)
Follow up with a family md  Next week.

## 2014-12-24 NOTE — ED Notes (Signed)
Pt stable, ambulatory, pain 2/10, states understanding of discharge instructions, family at bedside

## 2014-12-24 NOTE — ED Provider Notes (Signed)
CSN: [id number]     Arrival date & time 12/24/14  1911 History   First MD Initiated Contact with Patient 12/24/14 2025     Chief Complaint  Patient presents with  . Fever  . Cough  . Shortness of Breath     (Consider location/radiation/quality/duration/timing/severity/associated sxs/prior Treatment) Patient is a 29 y.o. female presenting with cough. The history is provided by the patient (pt states she has had a cough for over 2 weeks.  no help with prednisone).  Cough Cough characteristics:  Non-productive Severity:  Moderate Onset quality:  Gradual Timing:  Constant Progression:  Waxing and waning Chronicity:  New Associated symptoms: no chest pain, no eye discharge, no headaches and no rash     Past Medical History  Diagnosis Date  . Glucose found in urine on examination   . No pertinent past medical history   . Infection     UTI   Past Surgical History  Procedure Laterality Date  . No past surgeries     Family History  Problem Relation Age of Onset  . Other Neg Hx   . Hypertension Mother   . Cancer Paternal Aunt   . Diabetes Maternal Grandmother   . Hypertension Maternal Grandmother    History  Substance Use Topics  . Smoking status: Current Every Day Smoker -- 0.50 packs/day for 10 years    Types: Cigarettes  . Smokeless tobacco: Never Used  . Alcohol Use: No     Comment: rare   OB History    Gravida Para Term Preterm AB TAB SAB Ectopic Multiple Living   0              Review of Systems  Constitutional: Negative for appetite change and fatigue.  HENT: Negative for congestion, ear discharge and sinus pressure.   Eyes: Negative for discharge.  Respiratory: Positive for cough.   Cardiovascular: Negative for chest pain.  Gastrointestinal: Negative for abdominal pain and diarrhea.  Genitourinary: Negative for frequency and hematuria.  Musculoskeletal: Negative for back pain.  Skin: Negative for rash.  Neurological: Negative for seizures and headaches.   Psychiatric/Behavioral: Negative for hallucinations.      Allergies  Review of patient's allergies indicates no known allergies.  Home Medications   Prior to Admission medications   Medication Sig Start Date End Date Taking? Authorizing Provider  loratadine (CLARITIN) 10 MG tablet Take 10 mg by mouth daily.   Yes Historical Provider, MD  amoxicillin (AMOXIL) 500 MG capsule Take 1 capsule (500 mg total) by mouth 3 (three) times daily. 12/08/14   Rodolph BongEvan S Corey, MD  azithromycin (ZITHROMAX Z-PAK) 250 MG tablet 2 po day one, then 1 daily x 4 days 12/24/14   Bethann BerkshireJoseph Rannie Craney, MD  predniSONE (DELTASONE) 20 MG tablet Take 2 tablets (40 mg total) by mouth daily. 12/06/14   Roxy Horsemanobert Browning, PA-C   BP 122/74 mmHg  Pulse 96  Temp(Src) 100.9 F (38.3 C) (Oral)  Resp 16  Ht 5\' 5"  (1.651 m)  Wt 125 lb (56.7 kg)  BMI 20.80 kg/m2  SpO2 100%  LMP 12/08/2014 Physical Exam  Constitutional: She is oriented to person, place, and time. She appears well-developed.  HENT:  Head: Normocephalic.  Eyes: Conjunctivae and EOM are normal. No scleral icterus.  Neck: Neck supple. No thyromegaly present.  Cardiovascular: Normal rate and regular rhythm.  Exam reveals no gallop and no friction rub.   No murmur heard. Pulmonary/Chest: No stridor. She has no wheezes. She has no rales. She exhibits no  tenderness.  Abdominal: She exhibits no distension. There is no tenderness. There is no rebound.  Musculoskeletal: Normal range of motion. She exhibits no edema.  Lymphadenopathy:    She has no cervical adenopathy.  Neurological: She is oriented to person, place, and time. She exhibits normal muscle tone. Coordination normal.  Skin: No rash noted. No erythema.  Psychiatric: She has a normal mood and affect. Her behavior is normal.    ED Course  Procedures (including critical care time) Labs Review Labs Reviewed - No data to display  Imaging Review Dg Chest 2 View  12/24/2014   CLINICAL DATA:  Chest pain and  cough for 2 weeks.  EXAM: CHEST  2 VIEW  COMPARISON:  04/01/2012.  FINDINGS: The heart size and mediastinal contours are within normal limits. Both lungs are clear. The visualized skeletal structures are unremarkable.  IMPRESSION: No active cardiopulmonary disease.  Stable appearance from priors.   Electronically Signed   By: Davonna Belling M.D.   On: 12/24/2014 21:20     EKG Interpretation None      MDM   Final diagnoses:  Cough  Bronchitis   Bronchitis.  tx with zpak    Bethann Berkshire, MD 12/24/14 2149

## 2015-05-06 ENCOUNTER — Encounter (HOSPITAL_COMMUNITY): Payer: Self-pay | Admitting: Emergency Medicine

## 2015-05-06 ENCOUNTER — Emergency Department (HOSPITAL_COMMUNITY)
Admission: EM | Admit: 2015-05-06 | Discharge: 2015-05-06 | Discharge status: Against Medical Advice | Payer: 59 | Attending: Emergency Medicine | Admitting: Emergency Medicine

## 2015-05-06 DIAGNOSIS — M25552 Pain in left hip: Secondary | ICD-10-CM | POA: Insufficient documentation

## 2015-05-06 DIAGNOSIS — R102 Pelvic and perineal pain: Secondary | ICD-10-CM | POA: Insufficient documentation

## 2015-05-06 DIAGNOSIS — Z720 Tobacco use: Secondary | ICD-10-CM | POA: Insufficient documentation

## 2015-05-06 LAB — CBC
HCT: 34.9 % — ABNORMAL LOW (ref 36.0–46.0)
HEMOGLOBIN: 11.4 g/dL — AB (ref 12.0–15.0)
MCH: 26.9 pg (ref 26.0–34.0)
MCHC: 32.7 g/dL (ref 30.0–36.0)
MCV: 82.3 fL (ref 78.0–100.0)
PLATELETS: 231 10*3/uL (ref 150–400)
RBC: 4.24 MIL/uL (ref 3.87–5.11)
RDW: 13.9 % (ref 11.5–15.5)
WBC: 5.6 10*3/uL (ref 4.0–10.5)

## 2015-05-06 LAB — COMPREHENSIVE METABOLIC PANEL
ALT: 11 U/L — AB (ref 14–54)
ANION GAP: 7 (ref 5–15)
AST: 19 U/L (ref 15–41)
Albumin: 4.5 g/dL (ref 3.5–5.0)
Alkaline Phosphatase: 53 U/L (ref 38–126)
BUN: 19 mg/dL (ref 6–20)
CALCIUM: 9.5 mg/dL (ref 8.9–10.3)
CHLORIDE: 107 mmol/L (ref 101–111)
CO2: 25 mmol/L (ref 22–32)
CREATININE: 0.75 mg/dL (ref 0.44–1.00)
Glucose, Bld: 91 mg/dL (ref 65–99)
Potassium: 3.4 mmol/L — ABNORMAL LOW (ref 3.5–5.1)
SODIUM: 139 mmol/L (ref 135–145)
Total Bilirubin: 0.2 mg/dL — ABNORMAL LOW (ref 0.3–1.2)
Total Protein: 7.9 g/dL (ref 6.5–8.1)

## 2015-05-06 LAB — URINALYSIS, ROUTINE W REFLEX MICROSCOPIC
Bilirubin Urine: NEGATIVE
Glucose, UA: NEGATIVE mg/dL
Hgb urine dipstick: NEGATIVE
Ketones, ur: NEGATIVE mg/dL
LEUKOCYTES UA: NEGATIVE
NITRITE: NEGATIVE
PROTEIN: NEGATIVE mg/dL
Specific Gravity, Urine: 1.026 (ref 1.005–1.030)
Urobilinogen, UA: 1 mg/dL (ref 0.0–1.0)
pH: 5.5 (ref 5.0–8.0)

## 2015-05-06 LAB — LIPASE, BLOOD: LIPASE: 26 U/L (ref 22–51)

## 2015-05-06 LAB — I-STAT BETA HCG BLOOD, ED (MC, WL, AP ONLY): I-stat hCG, quantitative: <5 m[IU]/mL (ref <5)

## 2015-05-06 NOTE — ED Notes (Signed)
Pt not in lobby upon calling 

## 2015-05-06 NOTE — ED Notes (Signed)
Pt reports left lower abdominal pain that started on Tuesday that is causing pain in the left hip. She reports pain radiates to "privacy areas". She reports vaginal discharge but denies bleeding. She denies new partners.

## 2015-07-18 ENCOUNTER — Emergency Department (HOSPITAL_COMMUNITY)
Admission: EM | Admit: 2015-07-18 | Discharge: 2015-07-18 | Disposition: A | Discharge status: Home / Self Care | Payer: 59 | Attending: Emergency Medicine | Admitting: Emergency Medicine

## 2015-07-18 ENCOUNTER — Encounter (HOSPITAL_COMMUNITY): Payer: Self-pay | Admitting: Emergency Medicine

## 2015-07-18 DIAGNOSIS — R51 Headache: Secondary | ICD-10-CM | POA: Diagnosis present

## 2015-07-18 DIAGNOSIS — Z87440 Personal history of urinary (tract) infections: Secondary | ICD-10-CM | POA: Diagnosis not present

## 2015-07-18 DIAGNOSIS — Z79899 Other long term (current) drug therapy: Secondary | ICD-10-CM | POA: Diagnosis not present

## 2015-07-18 DIAGNOSIS — J0110 Acute frontal sinusitis, unspecified: Secondary | ICD-10-CM | POA: Diagnosis not present

## 2015-07-18 DIAGNOSIS — F17210 Nicotine dependence, cigarettes, uncomplicated: Secondary | ICD-10-CM | POA: Insufficient documentation

## 2015-07-18 MED ORDER — AMOXICILLIN 500 MG PO CAPS: 1000.0000 mg | ORAL_CAPSULE | Freq: Two times a day (BID) | ORAL | Status: DC

## 2015-07-18 MED ORDER — IBUPROFEN 600 MG PO TABS: 600.0000 mg | ORAL_TABLET | Freq: Four times a day (QID) | ORAL | Status: DC | PRN

## 2015-07-18 NOTE — ED Notes (Signed)
Pt to ER with complaint of headache x6 days after being congested. Pt reports no relief after tylenol, ibuprofen, excedrin, and goody powder. Reports blurred vision and nausea without vomiting. No neuro deficits. A/O x4. VSS

## 2015-07-18 NOTE — ED Provider Notes (Signed)
CSN: [id number]     Arrival date & time 07/18/15  0806 History   First MD Initiated Contact with Patient 07/18/15 0818     Chief Complaint  Patient presents with  . Headache     (Consider location/radiation/quality/duration/timing/severity/associated sxs/prior Treatment) HPI Patient states she's had 6 days of a headache associated with nasal congestion and drainage. She has also had a dry cough. Patient reports that the headache is behind both of her eyes and on her temples. She reports it is at its worst in the mornings when she wakes up. She gets some mild relief from over-the-counter preparations that she takes in the morning. She states also she is getting some tearing in her eyes and noting some occasional blurred vision. There is been no fever no neck stiffness. She denies sore throat or difficulty breathing. No confusion or incoordination. Past Medical History  Diagnosis Date  . Glucose found in urine on examination   . No pertinent past medical history   . Infection     UTI   Past Surgical History  Procedure Laterality Date  . No past surgeries     Family History  Problem Relation Age of Onset  . Other Neg Hx   . Hypertension Mother   . Cancer Paternal Aunt   . Diabetes Maternal Grandmother   . Hypertension Maternal Grandmother    Social History  Substance Use Topics  . Smoking status: Current Every Day Smoker -- 0.50 packs/day for 10 years    Types: Cigarettes  . Smokeless tobacco: Never Used  . Alcohol Use: No     Comment: rare   OB History    Gravida Para Term Preterm AB TAB SAB Ectopic Multiple Living   0              Review of Systems  10 Systems reviewed and are negative for acute change except as noted in the HPI.   Allergies  Review of patient's allergies indicates no known allergies.  Home Medications   Prior to Admission medications   Medication Sig Start Date End Date Taking? Authorizing Provider  amoxicillin (AMOXIL) 500 MG capsule Take 1  capsule (500 mg total) by mouth 3 (three) times daily. 12/08/14   Rodolph BongEvan S Corey, MD  amoxicillin (AMOXIL) 500 MG capsule Take 2 capsules (1,000 mg total) by mouth 2 (two) times daily. 07/18/15   Arby BarretteMarcy Sahira Cataldi, MD  azithromycin (ZITHROMAX Z-PAK) 250 MG tablet 2 po day one, then 1 daily x 4 days 12/24/14   Bethann BerkshireJoseph Zammit, MD  ibuprofen (ADVIL,MOTRIN) 600 MG tablet Take 1 tablet (600 mg total) by mouth every 6 (six) hours as needed. 07/18/15   Arby BarretteMarcy Jyll Tomaro, MD  loratadine (CLARITIN) 10 MG tablet Take 10 mg by mouth daily.    Historical Provider, MD  predniSONE (DELTASONE) 20 MG tablet Take 2 tablets (40 mg total) by mouth daily. 12/06/14   Roxy Horsemanobert Browning, PA-C   BP 132/92 mmHg  Pulse 81  Temp(Src) 98.2 F (36.8 C) (Oral)  Resp 16  SpO2 100% Physical Exam  Constitutional: She is oriented to person, place, and time. She appears well-developed and well-nourished.  HENT:  Head: Normocephalic and atraumatic.  Positive percussion tenderness over the left frontal and maxillary sinus. Oral cavity is widely patent without posterior or pharyngeal erythema or exudate. Patient does have several teeth with dental decay in the lower mandible. She denies pain or tenderness associated with these teeth. No associated facial swelling. Bilateral TMs mild tympanic sclerosis but no erythema  or bulging. Canals are patent.  Eyes: Conjunctivae and EOM are normal. Pupils are equal, round, and reactive to light. Right eye exhibits no discharge. Left eye exhibits no discharge.  Neck: Neck supple.  Cardiovascular: Normal rate, regular rhythm, normal heart sounds and intact distal pulses.   Pulmonary/Chest: Effort normal and breath sounds normal.  Abdominal: Soft. Bowel sounds are normal. She exhibits no distension. There is no tenderness.  Musculoskeletal: Normal range of motion. She exhibits no edema.  Neurological: She is alert and oriented to person, place, and time. She has normal strength. Coordination normal. GCS eye  subscore is 4. GCS verbal subscore is 5. GCS motor subscore is 6.  Skin: Skin is warm, dry and intact.  Psychiatric: She has a normal mood and affect.    ED Course  Procedures (including critical care time) Labs Review Labs Reviewed - No data to display  Imaging Review No results found. I have personally reviewed and evaluated these images and lab results as part of my medical decision-making.   EKG Interpretation None      MDM   Final diagnoses:  Acute frontal sinusitis, recurrence not specified   Patient presents with one-week worth of headache symptoms. She has associated URI symptoms and reproducible facial pain on percussion. As she is otherwise well without toxicity, confusion or neck stiffness. At this time the patient will be treated for sinusitis.  Arby Barrette, MD 07/18/15 [phone]

## 2015-07-18 NOTE — Discharge Instructions (Signed)
Sinusitis, Adult °Sinusitis is redness, soreness, and inflammation of the paranasal sinuses. Paranasal sinuses are air pockets within the bones of your face. They are located beneath your eyes, in the middle of your forehead, and above your eyes. In healthy paranasal sinuses, mucus is able to drain out, and air is able to circulate through them by way of your nose. However, when your paranasal sinuses are inflamed, mucus and air can become trapped. This can allow bacteria and other germs to grow and cause infection. °Sinusitis can develop quickly and last only a short time (acute) or continue over a long period (chronic). Sinusitis that lasts for more than 12 weeks is considered chronic. °CAUSES °Causes of sinusitis include: °· Allergies. °· Structural abnormalities, such as displacement of the cartilage that separates your nostrils (deviated septum), which can decrease the air flow through your nose and sinuses and affect sinus drainage. °· Functional abnormalities, such as when the small hairs (cilia) that line your sinuses and help remove mucus do not work properly or are not present. °SIGNS AND SYMPTOMS °Symptoms of acute and chronic sinusitis are the same. The primary symptoms are pain and pressure around the affected sinuses. Other symptoms include: °· Upper toothache. °· Earache. °· Headache. °· Bad breath. °· Decreased sense of smell and taste. °· A cough, which worsens when you are lying flat. °· Fatigue. °· Fever. °· Thick drainage from your nose, which often is green and may contain pus (purulent). °· Swelling and warmth over the affected sinuses. °DIAGNOSIS °Your health care provider will perform a physical exam. During your exam, your health care provider may perform any of the following to help determine if you have acute sinusitis or chronic sinusitis: °· Look in your nose for signs of abnormal growths in your nostrils (nasal polyps). °· Tap over the affected sinus to check for signs of  infection. °· View the inside of your sinuses using an imaging device that has a light attached (endoscope). °If your health care provider suspects that you have chronic sinusitis, one or more of the following tests may be recommended: °· Allergy tests. °· Nasal culture. A sample of mucus is taken from your nose, sent to a lab, and screened for bacteria. °· Nasal cytology. A sample of mucus is taken from your nose and examined by your health care provider to determine if your sinusitis is related to an allergy. °TREATMENT °Most cases of acute sinusitis are related to a viral infection and will resolve on their own within 10 days. Sometimes, medicines are prescribed to help relieve symptoms of both acute and chronic sinusitis. These may include pain medicines, decongestants, nasal steroid sprays, or saline sprays. °However, for sinusitis related to a bacterial infection, your health care provider will prescribe antibiotic medicines. These are medicines that will help kill the bacteria causing the infection. °Rarely, sinusitis is caused by a fungal infection. In these cases, your health care provider will prescribe antifungal medicine. °For some cases of chronic sinusitis, surgery is needed. Generally, these are cases in which sinusitis recurs more than 3 times per year, despite other treatments. °HOME CARE INSTRUCTIONS °· Drink plenty of water. Water helps thin the mucus so your sinuses can drain more easily. °· Use a humidifier. °· Inhale steam 3-4 times a day (for example, sit in the bathroom with the shower running). °· Apply a warm, moist washcloth to your face 3-4 times a day, or as directed by your health care provider. °· Use saline nasal sprays to help   moisten and clean your sinuses. °· Take medicines only as directed by your health care provider. °· If you were prescribed either an antibiotic or antifungal medicine, finish it all even if you start to feel better. °SEEK IMMEDIATE MEDICAL CARE IF: °· You have  increasing pain or severe headaches. °· You have nausea, vomiting, or drowsiness. °· You have swelling around your face. °· You have vision problems. °· You have a stiff neck. °· You have difficulty breathing. °  °This information is not intended to replace advice given to you by your health care provider. Make sure you discuss any questions you have with your health care provider. °  °Document Released: 08/19/2005 Document Revised: 09/09/2014 Document Reviewed: 09/03/2011 °Elsevier Interactive Patient Education ©2016 Elsevier Inc. ° °Emergency Department Resource Guide °1) Find a Doctor and Pay Out of Pocket °Although you won't have to find out who is covered by your insurance plan, it is a good idea to ask around and get recommendations. You will then need to call the office and see if the doctor you have chosen will accept you as a new patient and what types of options they offer for patients who are self-pay. Some doctors offer discounts or will set up payment plans for their patients who do not have insurance, but you will need to ask so you aren't surprised when you get to your appointment. ° °2) Contact Your Local Health Department °Not all health departments have doctors that can see patients for sick visits, but many do, so it is worth a call to see if yours does. If you don't know where your local health department is, you can check in your phone book. The CDC also has a tool to help you locate your state's health department, and many state websites also have listings of all of their local health departments. ° °3) Find a Walk-in Clinic °If your illness is not likely to be very severe or complicated, you may want to try a walk in clinic. These are popping up all over the country in pharmacies, drugstores, and shopping centers. They're usually staffed by nurse practitioners or physician assistants that have been trained to treat common illnesses and complaints. They're usually fairly quick and inexpensive.  However, if you have serious medical issues or chronic medical problems, these are probably not your best option. ° °No Primary Care Doctor: °- Call [facility] at  832-8000 - they can help you locate a primary care doctor that  accepts your insurance, provides certain services, etc. °- Physician Referral Service- 1-800-533-3463 ° °Chronic Pain Problems: °Organization         Address  Phone   Notes  °[facility]  (336) 297-2271 Patients need to be referred by their primary care doctor.  ° °Medication Assistance: °Organization         Address  Phone   Notes  °[place] [facility] [address] °[place], [place] [address] (336) 641-8030 --Must be a resident of [place] °-- Must have NO insurance coverage whatsoever (no Medicaid/ Medicare, etc.) °-- The pt. MUST have a primary care doctor that directs their care regularly and follows them in the community °  °[facility]  (866) 331-1348   °[employer]  (888) 892-1162   ° °Agencies that provide inexpensive medical care: °Organization         Address  Phone   Notes  °[facility]  (336) 832-8035   °[facility]    (336) 832-7272   °[facility] [address] °[place], [place] [address] (336) 832-4777   °[facility] [address], °[place] (336) 271-4999   °[facility]    (336) 373-0678   °[facility]    (336) 272-1050   °[facility] ° [address], [place] Phone:  (336) 832-4444, Fax:  (336) 832-4440 Hours of Operation:  9 am - 6 pm, M-F.  Also accepts Medicaid/Medicare and self-pay.  °[facility] ° [address], [place] Phone: (336) 832-3150, Fax: (336) [phone]. Hours of Operation:  8:30 am - 5:30 pm, M-F.  Also accepts Medicaid and self-pay.  °[facility] [place] [address], [place] Phone: (336) 878-6027   °[facility] [address], [place], [place] (336)723-1848, Ext. 123 Mondays & Thursdays: 7-9 AM.  First 15 patients are seen on a first come, first serve basis. °  ° °Medicaid-accepting [place] Providers: ° °Organization         Address  Phone   Notes  °[facility] [address], [place] (336) 641-2100 Also accepts self-pay patients.  °[facility] [address], [place] ° (336) 856-9996   °[facility] [address], [place] (336) 288-8857   °[facility] [address] [place] [address], [place] (336) 299-7000   °Veita Bland [address], [place]  ° (336) 373-1557 Only accepts [place] Access Medicaid patients after they have their name applied to their card.  ° °Self-Pay (no insurance) in [place]: ° °Organization         Address  Phone   Notes  °Sickle Cell Patients, [facility] [address], [place] (336) 832-1970   °[facility] [address], [place] (336) 832-4400   °[facility] ° [address], [place] (336) 992-4800   °[facility]/Dr. Osei-Bonsu ° [address] [place] [address], [place] or [address], [place] (336) [phone] Phone number for both [place] and [place] locations is the same.  °[facility] [address], [place] (336) 299-0000   °[facility] [address] [place] [address], [place] or [address] (336) 852-7530 °(336) 878-2260   °[facility] [address], [place] (336) 350-1642, phone; (336) 294-5005, fax Sees patients 1st and 3rd Saturday of every month.  Must not qualify for public or private insurance (i.e. Medicaid, Medicare, [place] Health Choice, Veterans' Benefits) • Household income should be no more than 200% of the poverty level •The clinic cannot treat you if you are pregnant or think  you are pregnant • Sexually transmitted diseases are not treated at the clinic.  ° ° °Dental Care: °Organization         Address  Phone  Notes  °[facility] [address], [place] (336) 641-6152 Accepts children up to age 21 who are enrolled in Medicaid or [place] Health Choice; pregnant women with a Medicaid card; and children who have applied for Medicaid or  Health Choice, but were declined, whose parents can pay a reduced fee at time of service.  °[facility] [place]  [address], [place] (336)   641-7733 Accepts children up to age 21 who are enrolled in Medicaid or [place] Health Choice; pregnant women with a Medicaid card; and children who have applied for Medicaid or [place] Health Choice, but were declined, whose parents can pay a reduced fee at time of service.  °[facility] ° [address], [place] (336) 641-4533 Patients are seen by appointment only. Walk-ins are not accepted. [facility] will see patients 18 years of age and older. °Monday - Tuesday (8am-5pm) °Most Wednesdays (8:30-5pm) °$30 per visit, cash only  °[facility] ° [address], [place] (336) 641-4533 Patients are seen by appointment only. Walk-ins are not accepted. [facility] will see patients 18 years of age and older. °One Wednesday Evening (Monthly: Volunteer Based).  $30 per visit, cash only  °[facility]  (919) 537-3737 for adults; Children under age 4, call Graduate Pediatric Dentistry at ([phone]) [phone]. Children aged 4-14, please call (919) [phone] to request a pediatric application. ° Dental services are provided in all areas of dental care including fillings, crowns and bridges, complete and partial dentures, implants, gum treatment, root canals, and extractions. Preventive care is also provided. Treatment is provided to both adults and  children. °Patients are selected via a lottery and there is often a waiting list. °  °[facility] [address], °[place] ° (336) 763-8833 [email or web address] °  °[facility] [address], [place], [place] (336)723-1848, Ext. 123 Second and Fourth Thursday of each month, opens at 6:30 AM; Clinic ends at 9 AM.  Patients are seen on a first-come first-served basis, and a limited number are seen during each clinic.  ° °[facility] ° [address], [place], [place] (336) 723-7904   Eligibility Requirements °You must have lived in [place], or [place] counties for at least the last three months. °  You cannot be eligible for state or federal sponsored healthcare insurance, including Veterans Administration, Medicaid, or Medicare. °  You generally cannot be eligible for healthcare insurance through your employer.  °  How to apply: °Eligibility screenings are held every Tuesday and Wednesday afternoon from 1:00 pm until 4:00 pm. You do not need an appointment for the interview!  °[facility] [address], [place], [place] 336-631-2330   °[facility]  336-342-8273   °[facility]  336-703-3100   °[facility]  336-570-6415   ° °Behavioral Health Resources in the Community: °Intensive Outpatient Programs °Organization         Address  Phone  Notes  °[facility] [address], [place], [place] 336-878-6098   °[facility] [address], [place], [place] 336-832-9800   °[facility] [address], [place], [place] ° 336-882-2125   °[facility] [address],  °[place], [place] 1-800-853-5163 or 336-641-4981   °Substance Abuse Resources °Organization         Address  Phone  Notes  °[facility]  336-882-2125   °[facility]  336-784-9470   °[facility]  336-285-9073    °[facility]  336-845-3988   °Residential & Outpatient Substance Abuse Program  1-800-659-3381   °Psychological Services °Organization         Address  Phone  Notes  °[facility]  336- 832-9600   °[facility]  336- 378-7881   °  [facility] [address], [place] 1-800-853-5163 or 336-641-4981   ° °Mobile Crisis Teams °Organization         Address  Phone  Notes  °[facility], Mobile Crisis Care Unit  1-877-626-1772   °Assertive °[facility] ° [address] [place], [place] 336-834-9664   °Sharon DeEsch [address] °[place] 336-554-5454   ° °Self-Help/Support Groups °Organization         Address  Phone             Notes  °[facility] - variety of support groups  336- 373-1402 Call for more information  °Narcotics Anonymous (NA), [facility] [address], °[place]  2 meetings at this location  ° °Residential Treatment Programs °Organization         Address  Phone  Notes  °[facility] [address],    °[place]  1-866-801-8205   °[facility] ° [address], [place], [place] 704-293-8524   °[facility] [address], [place] 336-845-3988 Admissions: 8am-3pm M-F  °[facility] [address]    °[place], [place] 336-841-1104   °[facility] [address], [place], [place] 336-379-7146   °[facility] [address]  °[place], [place] 336-285-9073   °[facility] - Intensive Outpatient [address], [place], [place] 336-852-3033   °[facility] [address]  °[place], [place] 1-877-615-2722 or 336-784-9470   °[facility] [address] [place], [place] 336-227-7417 Accepts Medicaid  °[facility] [address]  °[place] 1-800-659-3381 Substance Abuse/Addiction Treatment  ° °[place]  Behavioral Health Resources °Organization         Address  Phone  Notes  °[facility]  (888) 581-9988   °Julie Brannon, PhD [address] [place], [place]   (336) 349-5553 or (336) 951-0000   ° [facility]al   [address] °[place], [place] (336) 349-4454   °[facility] [address], [place], [place] (336) 342-8316 Insurance/Medicaid/sponsorship through [facility]  °[facility] [address]                                    [place], [place] (336) 342-8316 Therapy/tele-psych/case  °[facility] [address]  ° [place], [place] (336) 349-2233    °Dr. Arfeen  (336) 349-4544   °[facility] [place]  [employer] [place] [facility] 1) [address], [place] °2) [address], [place] °3)  [address], [place] (336) 349-3220 °(336) 342-7768 ° °(336) 342-8140   °[place] [facility] (336) 342-1394 or (336) 342-3537 (After Hours)    ° ° ° °

## 2015-07-23 ENCOUNTER — Encounter (HOSPITAL_COMMUNITY): Payer: Self-pay | Admitting: *Deleted

## 2015-07-23 ENCOUNTER — Inpatient Hospital Stay (HOSPITAL_COMMUNITY)
Admission: AD | Admit: 2015-07-23 | Discharge: 2015-07-23 | Disposition: A | Discharge status: Home / Self Care | Payer: 59 | Source: Ambulatory Visit | Attending: Obstetrics and Gynecology | Admitting: Obstetrics and Gynecology

## 2015-07-23 DIAGNOSIS — B373 Candidiasis of vulva and vagina: Secondary | ICD-10-CM

## 2015-07-23 DIAGNOSIS — L293 Anogenital pruritus, unspecified: Secondary | ICD-10-CM | POA: Diagnosis present

## 2015-07-23 DIAGNOSIS — B3731 Acute candidiasis of vulva and vagina: Secondary | ICD-10-CM

## 2015-07-23 LAB — HIV ANTIBODY (ROUTINE TESTING W REFLEX): HIV Screen 4th Generation wRfx: NONREACTIVE

## 2015-07-23 LAB — WET PREP, GENITAL
CLUE CELLS WET PREP: NONE SEEN
Sperm: NONE SEEN
Trich, Wet Prep: NONE SEEN

## 2015-07-23 LAB — POCT PREGNANCY, URINE: Preg Test, Ur: NEGATIVE

## 2015-07-23 MED ORDER — FLUCONAZOLE 150 MG PO TABS: ORAL_TABLET | ORAL | Status: DC

## 2015-07-23 MED ORDER — CLOTRIMAZOLE 1 % VA CREA
1.0000 | TOPICAL_CREAM | Freq: Two times a day (BID) | VAGINAL | Status: DC
  Administered 2015-07-23: 1 via VAGINAL
  Filled 2015-07-23: qty 45

## 2015-07-23 MED ORDER — CLOTRIMAZOLE 1 % VA CREA
1.0000 | TOPICAL_CREAM | Freq: Every day | VAGINAL | Status: DC
  Filled 2015-07-23: qty 45

## 2015-07-23 MED ORDER — CLOTRIMAZOLE 1 % EX CREA
TOPICAL_CREAM | Freq: Two times a day (BID) | CUTANEOUS | Status: DC
  Filled 2015-07-23: qty 15

## 2015-07-23 NOTE — Discharge Instructions (Signed)
Yeast Vaginitis Vaginitis in a soreness, swelling and redness (inflammation) of the vagina and vulva. Monilial vaginitis is not a sexually transmitted infection. CAUSES  Yeast vaginitis is caused by yeast (candida) that is normally found in your vagina. With a yeast infection, the candida has overgrown in number to a point that upsets the chemical balance. SYMPTOMS   White, thick vaginal discharge.  Swelling, itching, redness and irritation of the vagina and possibly the lips of the vagina (vulva).  Burning or painful urination.  Painful intercourse. DIAGNOSIS  Things that may contribute to monilial vaginitis are:  Postmenopausal and virginal states.  Pregnancy.  Infections.  Being tired, sick or stressed, especially if you had monilial vaginitis in the past.  Diabetes. Good control will help lower the chance.  Birth control pills.  Tight fitting garments.  Using bubble bath, feminine sprays, douches or deodorant tampons.  Taking certain medications that kill germs (antibiotics).  Sporadic recurrence can occur if you become ill. TREATMENT  Your caregiver will give you medication.  There are several kinds of anti monilial vaginal creams and suppositories specific for monilial vaginitis. For recurrent yeast infections, use a suppository or cream in the vagina 2 times a week, or as directed.  Anti-monilial or steroid cream for the itching or irritation of the vulva may also be used. Get your caregiver's permission.  Painting the vagina with methylene blue solution may help if the monilial cream does not work.  Eating yogurt may help prevent monilial vaginitis. HOME CARE INSTRUCTIONS   Finish all medication as prescribed.  Do not have sex until treatment is completed or after your caregiver tells you it is okay.  Take warm sitz baths.  Do not douche.  Do not use tampons, especially scented ones.  Wear cotton underwear.  Avoid tight pants and panty hose.  Tell  your sexual partner that you have a yeast infection. They should go to their caregiver if they have symptoms such as mild rash or itching.  Your sexual partner should be treated as well if your infection is difficult to eliminate.  Practice safer sex. Use condoms.  Some vaginal medications cause latex condoms to fail. Vaginal medications that harm condoms are:  Cleocin cream.  Butoconazole (Femstat).  Terconazole (Terazol) vaginal suppository.  Miconazole (Monistat) (may be purchased over the counter). SEEK MEDICAL CARE IF:   You have a temperature by mouth above 102 F (38.9 C).  The infection is getting worse after 2 days of treatment.  The infection is not getting better after 3 days of treatment.  You develop blisters in or around your vagina.  You develop vaginal bleeding, and it is not your menstrual period.  You have pain when you urinate.  You develop intestinal problems.  You have pain with sexual intercourse.   This information is not intended to replace advice given to you by your health care provider. Make sure you discuss any questions you have with your health care provider.   Document Released: 05/29/2005 Document Revised: 11/11/2011 Document Reviewed: 02/20/2015 Elsevier Interactive Patient Education 2016 Elsevier Inc.  

## 2015-07-23 NOTE — MAU Note (Signed)
Had sex 3 days ago and used lubricant. Vag itching since Fri night and worse now with vag burning. Some white d/c

## 2015-07-26 LAB — GC/CHLAMYDIA PROBE AMP (~~LOC~~) NOT AT ARMC
Chlamydia: NEGATIVE
NEISSERIA GONORRHEA: NEGATIVE

## 2016-01-10 ENCOUNTER — Encounter (HOSPITAL_COMMUNITY): Payer: Self-pay | Admitting: Family Medicine

## 2016-01-10 ENCOUNTER — Emergency Department (HOSPITAL_COMMUNITY)
Admission: EM | Admit: 2016-01-10 | Discharge: 2016-01-10 | Disposition: A | Discharge status: Home / Self Care | Payer: 59 | Attending: Emergency Medicine | Admitting: Emergency Medicine

## 2016-01-10 DIAGNOSIS — R110 Nausea: Secondary | ICD-10-CM | POA: Diagnosis present

## 2016-01-10 DIAGNOSIS — R42 Dizziness and giddiness: Secondary | ICD-10-CM | POA: Diagnosis not present

## 2016-01-10 DIAGNOSIS — Z87440 Personal history of urinary (tract) infections: Secondary | ICD-10-CM | POA: Insufficient documentation

## 2016-01-10 DIAGNOSIS — Z79899 Other long term (current) drug therapy: Secondary | ICD-10-CM | POA: Insufficient documentation

## 2016-01-10 DIAGNOSIS — F17210 Nicotine dependence, cigarettes, uncomplicated: Secondary | ICD-10-CM | POA: Diagnosis not present

## 2016-01-10 DIAGNOSIS — Z3202 Encounter for pregnancy test, result negative: Secondary | ICD-10-CM | POA: Insufficient documentation

## 2016-01-10 DIAGNOSIS — R55 Syncope and collapse: Secondary | ICD-10-CM | POA: Insufficient documentation

## 2016-01-10 DIAGNOSIS — Z792 Long term (current) use of antibiotics: Secondary | ICD-10-CM | POA: Diagnosis not present

## 2016-01-10 LAB — URINALYSIS, ROUTINE W REFLEX MICROSCOPIC
BILIRUBIN URINE: NEGATIVE
Glucose, UA: NEGATIVE mg/dL
Hgb urine dipstick: NEGATIVE
KETONES UR: NEGATIVE mg/dL
Leukocytes, UA: NEGATIVE
NITRITE: NEGATIVE
PH: 5.5 (ref 5.0–8.0)
PROTEIN: NEGATIVE mg/dL
Specific Gravity, Urine: 1.014 (ref 1.005–1.030)

## 2016-01-10 LAB — CBC
HCT: 36.1 % (ref 36.0–46.0)
HEMOGLOBIN: 11.7 g/dL — AB (ref 12.0–15.0)
MCH: 27.1 pg (ref 26.0–34.0)
MCHC: 32.4 g/dL (ref 30.0–36.0)
MCV: 83.6 fL (ref 78.0–100.0)
PLATELETS: 244 10*3/uL (ref 150–400)
RBC: 4.32 MIL/uL (ref 3.87–5.11)
RDW: 13.9 % (ref 11.5–15.5)
WBC: 5.9 10*3/uL (ref 4.0–10.5)

## 2016-01-10 LAB — COMPREHENSIVE METABOLIC PANEL
ALT: 12 U/L — ABNORMAL LOW (ref 14–54)
ANION GAP: 12 (ref 5–15)
AST: 18 U/L (ref 15–41)
Albumin: 4.2 g/dL (ref 3.5–5.0)
Alkaline Phosphatase: 45 U/L (ref 38–126)
BUN: 9 mg/dL (ref 6–20)
CHLORIDE: 107 mmol/L (ref 101–111)
CO2: 21 mmol/L — ABNORMAL LOW (ref 22–32)
Calcium: 9.4 mg/dL (ref 8.9–10.3)
Creatinine, Ser: 0.77 mg/dL (ref 0.44–1.00)
Glucose, Bld: 93 mg/dL (ref 65–99)
Potassium: 4.8 mmol/L (ref 3.5–5.1)
Sodium: 140 mmol/L (ref 135–145)
TOTAL PROTEIN: 7.6 g/dL (ref 6.5–8.1)
Total Bilirubin: 0.6 mg/dL (ref 0.3–1.2)

## 2016-01-10 LAB — LIPASE, BLOOD: Lipase: 25 U/L (ref 11–51)

## 2016-01-10 LAB — I-STAT BETA HCG BLOOD, ED (MC, WL, AP ONLY)

## 2016-01-10 MED ORDER — SODIUM CHLORIDE 0.9 % IV BOLUS (SEPSIS)
1000.0000 mL | Freq: Once | INTRAVENOUS | Status: AC
  Administered 2016-01-10: 1000 mL via INTRAVENOUS

## 2016-01-10 MED ORDER — METOCLOPRAMIDE HCL 10 MG PO TABS: 10.0000 mg | ORAL_TABLET | Freq: Four times a day (QID) | ORAL | Status: DC

## 2016-01-10 MED ORDER — ONDANSETRON HCL 4 MG/2ML IJ SOLN
4.0000 mg | Freq: Once | INTRAMUSCULAR | Status: AC
  Administered 2016-01-10: 4 mg via INTRAVENOUS
  Filled 2016-01-10: qty 2

## 2016-01-10 MED ORDER — METOCLOPRAMIDE HCL 5 MG/ML IJ SOLN
10.0000 mg | Freq: Once | INTRAMUSCULAR | Status: AC
  Administered 2016-01-10: 10 mg via INTRAVENOUS
  Filled 2016-01-10: qty 2

## 2016-01-10 NOTE — ED Provider Notes (Signed)
CSN: [id number]     Arrival date & time 01/10/16  1131 History  By signing my name below, I, Emmanuella Mensah, attest that this documentation has been prepared under the direction and in the presence of Despina Boan, PA-C. Electronically Signed: Angelene Giovanni, ED Scribe. 01/10/2016. 12:34 PM.    Chief Complaint  Patient presents with  . Nausea  . Weakness   The history is provided by the patient. No language interpreter was used.   HPI Comments: Melanie Strong is a 30 y.o. female who presents to the Emergency Department complaining of sudden onset of ongoing sensation of weakness and dizziness onset this am. States that it is worse when standing up. Pt reports associated nausea. No alleviating factors noted. She has not tried any medications PTA. She states that she is a Museum/gallery curator at [employer] and her symptoms got worse while she was standing at work earlier today. She states she felt like she was about ot pass out. Felt very lightheaded and flushed. She explains that she had a syncopal episode several years ago where she had similar symptoms but was unsure of the cause at that time. Pt is currently sexually active and denies birth control use. Her LNMP was 12/12/15. She is a current smoker but denies any drug abuse. She also denies any fever, chills, vomiting, diarrhea, cough, SOB, or CP. Denies OCP use, fam hx of blood clots, recent travel. Denies significant cardiac fam hx.   Past Medical History  Diagnosis Date  . Glucose found in urine on examination   . No pertinent past medical history   . Infection     UTI   Past Surgical History  Procedure Laterality Date  . No past surgeries     Family History  Problem Relation Age of Onset  . Other Neg Hx   . Hypertension Mother   . Cancer Paternal Aunt   . Diabetes Maternal Grandmother   . Hypertension Maternal Grandmother    Social History  Substance Use Topics  . Smoking status: Current Every Day Smoker -- 0.50 packs/day for 10  years    Types: Cigarettes  . Smokeless tobacco: Never Used  . Alcohol Use: No     Comment: rare   OB History    Gravida Para Term Preterm AB TAB SAB Ectopic Multiple Living   0              Review of Systems  Constitutional: Negative for fever and chills.  Respiratory: Negative for cough and shortness of breath.   Cardiovascular: Negative for chest pain.  Gastrointestinal: Positive for nausea. Negative for vomiting and diarrhea.  Neurological: Positive for dizziness and weakness.  All other systems reviewed and are negative.     Allergies  Review of patient's allergies indicates no known allergies.  Home Medications   Prior to Admission medications   Medication Sig Start Date End Date Taking? Authorizing Provider  amoxicillin (AMOXIL) 500 MG capsule Take 2 capsules (1,000 mg total) by mouth 2 (two) times daily. 07/18/15   Arby Barrette, MD  fluconazole (DIFLUCAN) 150 MG tablet Take one tablet now.  If symptoms persist, take second tablet in 3 days. 07/23/15   Lisa A Leftwich-Kirby, CNM  ibuprofen (ADVIL,MOTRIN) 600 MG tablet Take 1 tablet (600 mg total) by mouth every 6 (six) hours as needed. 07/18/15   Arby Barrette, MD  loratadine (CLARITIN) 10 MG tablet Take 10 mg by mouth daily.    Historical Provider, MD   BP 142/76 mmHg  Pulse 81  Temp(Src) 98.5 F (36.9 C) (Oral)  Resp 18  SpO2 100%  LMP 12/12/2015 Physical Exam  Constitutional: She is oriented to person, place, and time. She appears well-developed and well-nourished.  Appears uncomfortable but NAD  HENT:  Head: Normocephalic and atraumatic.  Mouth/Throat: Mucous membranes are dry. No posterior oropharyngeal edema or posterior oropharyngeal erythema.  Cardiovascular: Normal rate, regular rhythm and normal heart sounds.   Pulmonary/Chest: Effort normal.  Abdominal: There is no tenderness. There is no CVA tenderness.  Neurological: She is alert and oriented to person, place, and time. She has normal strength  and normal reflexes. No cranial nerve deficit or sensory deficit. Coordination normal.  Normal finger to nose No pronator drift  Skin: Skin is warm and dry.  Psychiatric: She has a normal mood and affect.  Nursing note and vitals reviewed.  Orthostatic VS for the past 24 hrs:  BP- Lying Pulse- Lying BP- Sitting Pulse- Sitting BP- Standing at 0 minutes Pulse- Standing at 0 minutes  01/10/16 1242 114/71 mmHg 68 122/78 mmHg 61 129/82 mmHg 72       ED Course  Procedures (including critical care time) DIAGNOSTIC STUDIES: Oxygen Saturation is 100% on RA, normal by my interpretation.    COORDINATION OF CARE: 12:22 PM- Pt advised of plan for treatment and pt agrees. Pt will receive blood work and EKG for further evaluation.    Labs Review Labs Reviewed  COMPREHENSIVE METABOLIC PANEL - Abnormal; Notable for the following:    CO2 21 (*)    ALT 12 (*)    All other components within normal limits  CBC - Abnormal; Notable for the following:    Hemoglobin 11.7 (*)    All other components within normal limits  URINALYSIS, ROUTINE W REFLEX MICROSCOPIC (NOT AT [facility]) - Abnormal; Notable for the following:    APPearance CLOUDY (*)    All other components within normal limits  LIPASE, BLOOD  I-STAT BETA HCG BLOOD, ED ([facility], [facility], [facility] ONLY)    Imaging Review No results found.   Noelle Penner, PA-C has personally reviewed and evaluated these images and lab results as part of her medical decision-making.   EKG Interpretation   Date/Time:  Wednesday Jan 10 2016 12:32:55 EDT Ventricular Rate:  64 PR Interval:  158 QRS Duration: 80 QT Interval:  416 QTC Calculation: 429 R Axis:   60 Text Interpretation:  Normal sinus rhythm T wave abnormality, consider  anterior ischemia Abnormal ECG twi in V2, new from prior t wave changes in  V3 similar to prior Confirmed by Bebe Shaggy  MD, DONALD ([id number]) on 01/10/2016  12:52:34 PM      MDM   Final diagnoses:  Intermittent lightheadedness  Nausea   Pre-syncope   Labs at baseline and unremarkable. EKG does show a TWI in V2 that is new from prior. I discussed this finding with pt and she does think she had some "funny feelings" in her chest about two days ago which resolved on their own. In the ED now pt states she feels much improved and has no nausea or lightheadedness, and would like to go home. She does not want to pursue further workup in the ED at this time. VSS and unremarkable. PERC 0. Doubt ACS or PE. Neuro exam intact. Pt ambulatory asymptomatically. Will give cards info for f/u. Also instructed to f/u with PCP. Rx for anti-emetics given. Strict ER return precautions given.   I personally performed the services described in this documentation, which was scribed  in my presence. The recorded information has been reviewed and is accurate.   Carlene CoriaSerena Y Livia Tarr, PA-C 01/10/16 1533  Loren Raceravid Yelverton, MD 01/10/16 [phone]

## 2016-01-10 NOTE — Discharge Instructions (Signed)
Take medications as prescribed as needed for nausea. Return to the emergency room for worsening condition or new concerning symptoms. Follow up with your regular doctor as well as cardiology (the heart specialists). If you don't have a regular doctor use one of the numbers below to establish a primary care doctor.   Emergency Department Resource Guide 1) Find a Doctor and Pay Out of Pocket Although you won't have to find out who is covered by your insurance plan, it is a good idea to ask around and get recommendations. You will then need to call the office and see if the doctor you have chosen will accept you as a new patient and what types of options they offer for patients who are self-pay. Some doctors offer discounts or will set up payment plans for their patients who do not have insurance, but you will need to ask so you aren't surprised when you get to your appointment.  2) Contact Your Local Health Department Not all health departments have doctors that can see patients for sick visits, but many do, so it is worth a call to see if yours does. If you don't know where your local health department is, you can check in your phone book. The CDC also has a tool to help you locate your state's health department, and many state websites also have listings of all of their local health departments.  3) Find a Walk-in Clinic If your illness is not likely to be very severe or complicated, you may want to try a walk in clinic. These are popping up all over the country in pharmacies, drugstores, and shopping centers. They're usually staffed by nurse practitioners or physician assistants that have been trained to treat common illnesses and complaints. They're usually fairly quick and inexpensive. However, if you have serious medical issues or chronic medical problems, these are probably not your best option.  No Primary Care Doctor: - Call Health Connect at  [phone] - they can help you locate a primary care  doctor that  accepts your insurance, provides certain services, etc. - Physician Referral [phone]- [phone]  Emergency Department Resource Guide 1) Find a Doctor and Pay Out of Pocket Although you won't have to find out who is covered by your insurance plan, it is a good idea to ask around and get recommendations. You will then need to call the office and see if the doctor you have chosen will accept you as a new patient and what types of options they offer for patients who are self-pay. Some doctors offer discounts or will set up payment plans for their patients who do not have insurance, but you will need to ask so you aren't surprised when you get to your appointment.  2) Contact Your Local Health Department Not all health departments have doctors that can see patients for sick visits, but many do, so it is worth a call to see if yours does. If you don't know where your local health department is, you can check in your phone book. The CDC also has a tool to help you locate your state's health department, and many state websites also have listings of all of their local health departments.  3) Find a Walk-in Clinic If your illness is not likely to be very severe or complicated, you may want to try a walk in clinic. These are popping up all over the country in pharmacies, drugstores, and shopping centers. They're usually staffed by nurse practitioners or physician assistants that have  been trained to treat common illnesses and complaints. They're usually fairly quick and inexpensive. However, if you have serious medical issues or chronic medical problems, these are probably not your best option.  No Primary Care Doctor: - Call Health Connect at  [phone] - they can help you locate a primary care doctor that  accepts your insurance, provides certain services, etc. - Physician Referral Service- [phone]  Chronic Pain Problems: Organization         Address  Phone   Notes  [facility]  [phone] Patients need to be referred by their primary care doctor.   Medication Assistance: Organization         Address  Phone   Notes  [facility] Medication [facility]: Lambert Rhonda W [address] [place] [place], [place] [address] [phone] --Must be a resident of [facility] -- Must have NO insurance coverage whatsoever (no Medicaid/ Medicare, etc.) -- The pt. MUST have a primary care doctor that directs their care regularly and follows them in the community   [facility]  [phone]   [employer]  [phone]    Agencies that provide inexpensive medical care: Organization         Address  Phone   Notes  [facility]  [phone]   [facility]    [phone]   [facility] [address] [place], [place] [address] [phone]   [facility] [address] [place] [phone]   [facility]    [phone]   [facility]    [phone]   [facility]  [address], [place] Phone:  [phone], Fax:  [phone] Hours of Operation:  9 am - 6 pm, M-F.  Also accepts Medicaid/Medicare and self-pay.  [facility]  [address], [place] Phone: [phone], Fax: [phone]. Hours of Operation:  8:30 am - 5:30 pm, M-F.  Also accepts Medicaid and self-pay.  [facility] [place] [address], [place] Phone: [phone]   [facility] [address] Natasha Bence [place], [place] [phone], Ext. [phone] Mondays & Thursdays: 7-9 AM.  First 15 patients are seen on a first come, first serve basis.    Medicaid-accepting [facility] Providers:  Organization         Address  Phone   Notes  [facility] [address], [place] [phone] Also accepts self-pay patients.  [facility] [address]  Laurell Josephs [place], [place]  [phone]   [facility] [address], [place] [phone]   [facility] [address], [place] [phone]   Renaye Rakers [address], [place]   [phone] Only accepts [place] patients after they have their name applied to their card.   Self-Pay (no insurance) in [facility]:  Organization         Address  Phone   Notes  Sickle Cell Patients, [facility] [address] [place], [place] [phone]   [facility] [address] [place], [place] [phone]   [facility] [place]  [address], [place] [phone]   [facility]/Dr. Osei-Bonsu  [address] [place] [address], [place] or  [phone], [address], [place] [phone] Phone number for both [facility] and [place] locations is the same.  [facility] [address], [place] [phone]   [facility] [address] [place] or [address] [phone]   [facility] [address], [place] [phone], phone; [phone], fax Sees patients 1st and 3rd Saturday of every month.  Must not qualify for public or private insurance (i.e. Medicaid, Medicare, [facility] Health Choice, Veterans' Benefits)  Household income should be no more than 200% of the poverty level The clinic cannot treat you if you are pregnant or think you are pregnant  Sexually transmitted diseases are not treated at the clinic.

## 2016-01-10 NOTE — ED Notes (Addendum)
Pt here for Nausea and sts she feels weak when she stands up since yesterday. Denies chest pai, SOB, headache.

## 2016-01-11 ENCOUNTER — Emergency Department (HOSPITAL_COMMUNITY)
Admission: EM | Admit: 2016-01-11 | Discharge: 2016-01-11 | Disposition: A | Discharge status: Home / Self Care | Payer: 59 | Attending: Emergency Medicine | Admitting: Emergency Medicine

## 2016-01-11 ENCOUNTER — Emergency Department (HOSPITAL_COMMUNITY): Payer: 59

## 2016-01-11 ENCOUNTER — Encounter (HOSPITAL_COMMUNITY): Payer: Self-pay | Admitting: Neurology

## 2016-01-11 DIAGNOSIS — R55 Syncope and collapse: Secondary | ICD-10-CM | POA: Diagnosis present

## 2016-01-11 DIAGNOSIS — R531 Weakness: Secondary | ICD-10-CM

## 2016-01-11 DIAGNOSIS — Z87440 Personal history of urinary (tract) infections: Secondary | ICD-10-CM | POA: Diagnosis not present

## 2016-01-11 DIAGNOSIS — R079 Chest pain, unspecified: Secondary | ICD-10-CM | POA: Insufficient documentation

## 2016-01-11 DIAGNOSIS — F17210 Nicotine dependence, cigarettes, uncomplicated: Secondary | ICD-10-CM | POA: Insufficient documentation

## 2016-01-11 DIAGNOSIS — Z79899 Other long term (current) drug therapy: Secondary | ICD-10-CM | POA: Insufficient documentation

## 2016-01-11 LAB — I-STAT TROPONIN, ED: Troponin i, poc: 0 ng/mL (ref 0.00–0.08)

## 2016-01-11 LAB — CBG MONITORING, ED: Glucose-Capillary: 96 mg/dL (ref 65–99)

## 2016-01-11 MED ORDER — IOPAMIDOL (ISOVUE-370) INJECTION 76%
INTRAVENOUS | Status: AC
  Administered 2016-01-11: 100 mL
  Filled 2016-01-11: qty 100

## 2016-01-11 MED ORDER — SODIUM CHLORIDE 0.9 % IV BOLUS (SEPSIS)
1000.0000 mL | Freq: Once | INTRAVENOUS | Status: AC
  Administered 2016-01-11: 1000 mL via INTRAVENOUS

## 2016-01-11 NOTE — ED Notes (Signed)
Patient transported to CT 

## 2016-01-11 NOTE — Discharge Instructions (Signed)

## 2016-01-11 NOTE — ED Provider Notes (Addendum)
CSN: [id number]     Arrival date & time 01/11/16  0930 History   First MD Initiated Contact with Patient 01/11/16 [phone]     Chief Complaint  Patient presents with  . Near Syncope     (Consider location/radiation/quality/duration/timing/severity/associated sxs/prior Treatment) HPI Comments: Patient is a healthy 30 year old female who presents today due to worsening dizziness, generalized weakness and nausea and vomiting. Patient was seen yesterday in the emergency room for similar symptoms. However she states when she went home she felt normal.  She denies cough, shortness of breath or abdominal pain. This morning around 7:30 after taking her husband work she started to feel poorly again. She became nauseated and vomited once. After that she said her legs felt shaky and she had some discomfort in her chest. She currently denies any abdominal pain but states her legs still feel funny. She denies any alcohol or drug use. No family history of PEs.  Patient is a 30 y.o. female presenting with near-syncope. The history is provided by the patient.  Near Syncope This is a recurrent problem. The current episode started yesterday. The problem has been gradually worsening. Associated symptoms include chest pain. Pertinent negatives include no abdominal pain, no headaches and no shortness of breath. Nothing aggravates the symptoms. Nothing relieves the symptoms. She has tried nothing for the symptoms. The treatment provided no relief.    Past Medical History  Diagnosis Date  . Glucose found in urine on examination   . No pertinent past medical history   . Infection     UTI   Past Surgical History  Procedure Laterality Date  . No past surgeries     Family History  Problem Relation Age of Onset  . Other Neg Hx   . Hypertension Mother   . Cancer Paternal Aunt   . Diabetes Maternal Grandmother   . Hypertension Maternal Grandmother    Social History  Substance Use Topics  . Smoking status:  Current Every Day Smoker -- 0.50 packs/day for 10 years    Types: Cigarettes  . Smokeless tobacco: Never Used  . Alcohol Use: No     Comment: rare   OB History    Gravida Para Term Preterm AB TAB SAB Ectopic Multiple Living   0              Review of Systems  Respiratory: Negative for shortness of breath.   Cardiovascular: Positive for chest pain and near-syncope.  Gastrointestinal: Negative for abdominal pain.  Neurological: Negative for headaches.  All other systems reviewed and are negative.     Allergies  Review of patient's allergies indicates no known allergies.  Home Medications   Prior to Admission medications   Medication Sig Start Date End Date Taking? Authorizing Provider  amoxicillin (AMOXIL) 500 MG capsule Take 2 capsules (1,000 mg total) by mouth 2 (two) times daily. 07/18/15   Arby BarretteMarcy Pfeiffer, MD  fluconazole (DIFLUCAN) 150 MG tablet Take one tablet now.  If symptoms persist, take second tablet in 3 days. 07/23/15   Lisa A Leftwich-Kirby, CNM  ibuprofen (ADVIL,MOTRIN) 600 MG tablet Take 1 tablet (600 mg total) by mouth every 6 (six) hours as needed. 07/18/15   Arby BarretteMarcy Pfeiffer, MD  loratadine (CLARITIN) 10 MG tablet Take 10 mg by mouth daily.    Historical Provider, MD  metoCLOPramide (REGLAN) 10 MG tablet Take 1 tablet (10 mg total) by mouth every 6 (six) hours. 01/10/16   Ace GinsSerena Y Sam, PA-C   BP 123/78 mmHg  Pulse  58  Temp(Src) 99.2 F (37.3 C) (Oral)  Resp 20  SpO2 100%  LMP 01/11/2016 Physical Exam  Constitutional: She is oriented to person, place, and time. She appears well-developed and well-nourished. No distress.  Appears upset but appropriate  HENT:  Head: Normocephalic and atraumatic.  Mouth/Throat: Oropharynx is clear and moist.  Eyes: Conjunctivae and EOM are normal. Pupils are equal, round, and reactive to light.  Neck: Normal range of motion. Neck supple.  Cardiovascular: Normal rate, regular rhythm and intact distal pulses.   No murmur  heard. Pulmonary/Chest: Effort normal and breath sounds normal. No respiratory distress. She has no wheezes. She has no rales.  Abdominal: Soft. She exhibits no distension. There is no tenderness. There is no rebound and no guarding.  Musculoskeletal: Normal range of motion. She exhibits no edema or tenderness.  Neurological: She is alert and oriented to person, place, and time.  Normal strength in lower extremities but appears to take effort to lift them.  Skin: Skin is warm and dry. No rash noted. No erythema.  Psychiatric: She has a normal mood and affect. Her behavior is normal.  Nursing note and vitals reviewed.   ED Course  Procedures (including critical care time) Labs Review Labs Reviewed  CBG MONITORING, ED  I-STAT TROPOININ, ED    Imaging Review Ct Angio Chest/abd/pel For Dissection W And/or W/wo  01/11/2016  CLINICAL DATA:  Vertigo,, weakness EXAM: CT CHEST, ABDOMEN, AND PELVIS WITHOUT AND WITH CONTRAST TECHNIQUE: Multidetector CT imaging of the chest, abdomen and pelvis was performed following the standard protocol before and during bolus administration of intravenous contrast. CONTRAST:  100 cc Isovue COMPARISON:  None. FINDINGS: CT CHEST FINDINGS Mediastinum/Lymph Nodes: Unenhanced images shows no thoracic aortic calcifications. Postcontrast images shows no evidence of thoracic aortic aneurysm or dissection. Heart size within normal limits. No mediastinal hematoma or adenopathy. There is no pulmonary embolus. No hilar adenopathy. Central airways are patent. Images of the thoracic inlet are unremarkable Lungs/Pleura: Images of the lung parenchyma shows no acute infiltrate or pleural effusion. There is no focal consolidation. No pneumothorax. No pleural thickening. No pulmonary nodules. No pulmonary edema. Musculoskeletal: Sagittal images shows no destructive bony lesions. Alignment, disc spaces and vertebral body heights are preserved. Sagittal view of the sternum is unremarkable.  No destructive rib lesions are noted. CT ABDOMEN PELVIS FINDINGS Hepatobiliary: Unenhanced and enhanced liver is unremarkable. No focal hepatic mass. No intrahepatic biliary ductal dilatation. No calcified gallstones are noted within gallbladder. Pancreas: Enhanced pancreas is unremarkable. Spleen: Enhanced spleen is unremarkable. Adrenals/Urinary Tract: No adrenal gland mass is noted. Enhanced kidneys are symmetrical in size. Stomach/Bowel: No gastric outlet obstruction. Minimal gaseous distended small bowel loops with some fluid and gas probable nonspecific ileus. There is no transition point in caliber of small bowel. No thickening of small bowel wall. No mesenteric fluid collection. No pericecal inflammation. The terminal ileum is unremarkable. Normal appendix partially visualized in coronal image 60. Vascular/Lymphatic: The study is of excellent technical quality. Abdominal aorta is normal caliber. No evidence of aneurysm or dissection. Bilateral renal artery is patent. SMA and celiac trunk are patent. Bilateral common iliac and external iliac arteries are patent. Splenic artery is patent. No retroperitoneal adenopathy. No mesenteric adenopathy. No retroperitoneal hematoma. Reproductive: The uterus is anteflexed. No adnexal masses noted. A vaginal tampon is noted. Trace free fluid is noted within left posterior cul-de-sac. Other: No ascites or free abdominal air. There is no evidence of inguinal adenopathy. Musculoskeletal: No destructive bony lesions are noted. Sagittal  images of the thoracic spine are unremarkable. IMPRESSION: 1. There is no evidence of aortic aneurysm or aortic dissection. 2. No mediastinal hematoma or adenopathy. 3. No acute infiltrate or pulmonary edema.  No pneumothorax. 4. Unremarkable abdominal aorta without evidence of aortic dissection. Patent celiac trunk, SMA, bilateral renal artery, splenic artery and bilateral iliac arteries. 5. Mild fluid in gaseous distended small bowel loops  within abdomen probable nonspecific ileus. No thickening of small bowel wall. No transition point in caliber of small bowel to suggest small bowel obstruction. 6. No pericecal inflammation.  Normal appendix. 7. No destructive bony lesions are noted. Electronically Signed   By: Natasha Mead M.D.   On: 01/11/2016 11:06   I have personally reviewed and evaluated these images and lab results as part of my medical decision-making.   EKG Interpretation   Date/Time:  Thursday Jan 11 2016 09:44:19 EDT Ventricular Rate:  92 PR Interval:  150 QRS Duration: 86 QT Interval:  370 QTC Calculation: 458 R Axis:   54 Text Interpretation:  Sinus rhythm T wave inversion RESOLVED SINCE  PREVIOUS Normal ECG Confirmed by Anitra Lauth  MD, Alphonzo Lemmings ([id number]) on  01/11/2016 10:09:33 AM Also confirmed by Anitra Lauth  MD, Alphonzo Lemmings ([id number]),  editor Joy Haegele Post, Cala Bradford [phone])  on 01/11/2016 10:56:42 AM      MDM   Final diagnoses:  Near syncope  Generalized weakness   Patient is a 30 year old healthy female with no prior medical history presenting today for recurrent dizziness, vomiting and generalized weakness. Patient was seen yesterday and had a normal CBC, CMP, UA and urine pregnancy test. She did have a mildly abnormal EKG yesterday and has follow-up with cardiology tomorrow. Patient states that today she is just started feeling worse and had an episode of vomiting. On exam here patient's exam is normal but some increased effort and raising her lower extremities. She has no abdominal pain, no neck pain, no back pain, no shortness of breath. She states after vomiting she got mild chest discomfort. No syncope. Started menses yesterday however denies cramping.  EKG today is normal. There is no T-wave inversion.  Because of patient's complaint of weakness in her legs and vague chest symptoms will ensure that patient does not have an aortic dissection. CT was negative for dissection but did show possible mild ileus. It is possible  patient's symptoms are viral in nature. She is not pregnant with no evidence of ectopic pregnancy. She has no lower abdominal pain concerning for appendicitis.  Blood sugar today is 96 and will repeat a troponin to ensure no evidence of leak. However EKG does not show pericarditis or other acute findings at this time. Patient's blood pressure is been normal. Mild elevation in temperature of 99.2.  1:09 PM Troponin is negative, again patient's labs yesterday were reassuring and vital signs today have been normal. On reevaluation patient states her leg still feels slightly funny but she otherwise is well-appearing. She was able to walk without difficulty. Patient does not describe symptoms of vertigo and symptoms are not changed by position. She has an appointment to see cardiology tomorrow and encouraged her to Keep that appointment.  Patient was given strict return precautions and encouraged to follow-up with primary care symptoms do not resolve.  Gwyneth Sprout, MD 01/11/16 1310  Gwyneth Sprout, MD 01/11/16 1311

## 2016-01-11 NOTE — ED Notes (Signed)
Pt reports this morning took her husband to work, then came home and laid down. While lying down, felt dizzy and nauseated. Vomited x 1. Pt reports still feeling dizzy. Denies pain but reports she specific central cp to one spot. Pt ambulatory to room. In NAD

## 2016-01-12 ENCOUNTER — Ambulatory Visit: Payer: 59 | Admitting: Cardiology

## 2016-01-14 DIAGNOSIS — R55 Syncope and collapse: Secondary | ICD-10-CM | POA: Insufficient documentation

## 2016-01-15 ENCOUNTER — Ambulatory Visit (INDEPENDENT_AMBULATORY_CARE_PROVIDER_SITE_OTHER): Payer: 59 | Admitting: Interventional Cardiology

## 2016-01-15 ENCOUNTER — Encounter: Payer: Self-pay | Admitting: Interventional Cardiology

## 2016-01-15 VITALS — BP 118/90 | HR 78 | Ht 65.0 in | Wt 126.0 lb

## 2016-01-15 DIAGNOSIS — R55 Syncope and collapse: Secondary | ICD-10-CM | POA: Diagnosis not present

## 2016-01-15 NOTE — Patient Instructions (Signed)
Medication Instructions:  Your physician recommends that you continue on your current medications as directed. Please refer to the Current Medication list given to you today.   Labwork: None ordered  Testing/Procedures: None ordered  Follow-Up: Your physician recommends that you schedule a follow-up appointment as needed   Any Other Special Instructions Will Be Listed Below (If Applicable). When episode occurs (Nausea/Sweaty) lay down or sit down. Use cold compress on your neck.  Follow up with your primary care physician or a gastroenterologist if you are still having recoccurring nausea     If you need a refill on your cardiac medications before your next appointment, please call your pharmacy.  Syncope, commonly known as fainting, is a temporary loss of consciousness. It occurs when the blood flow to the brain is reduced. Vasovagal syncope (also called neurocardiogenic syncope) is a fainting spell in which the blood flow to the brain is reduced because of a sudden drop in heart rate and blood pressure. Vasovagal syncope occurs when the brain and the cardiovascular system (blood vessels) do not adequately communicate and respond to each other. This is the most common cause of fainting. It often occurs in response to fear or some other type of emotional or physical stress. The body has a reaction in which the heart starts beating too slowly or the blood vessels expand, reducing blood pressure. This type of fainting spell is generally considered harmless. However, injuries can occur if a person takes a sudden fall during a fainting spell.  CAUSES  Vasovagal syncope occurs when a person's blood pressure and heart rate decrease suddenly, usually in response to a trigger. Many things and situations can trigger an episode. Some of these include:   Pain.   Fear.   The sight of blood or medical procedures, such as blood being drawn from a vein.   Common activities, such as coughing,  swallowing, stretching, or going to the bathroom.   Emotional stress.   Prolonged standing, especially in a warm environment.   Lack of sleep or rest.   Prolonged lack of food.   Prolonged lack of fluids.   Recent illness.  The use of certain drugs that affect blood pressure, such as cocaine, alcohol, marijuana, inhalants, and opiates.  SYMPTOMS  Before the fainting episode, you may:   Feel dizzy or light headed.   Become pale.  Sense that you are going to faint.   Feel like the room is spinning.   Have tunnel vision, only seeing directly in front of you.   Feel sick to your stomach (nauseous).   See spots or slowly lose vision.   Hear ringing in your ears.   Have a headache.   Feel warm and sweaty.   Feel a sensation of pins and needles. During the fainting spell, you will generally be unconscious for no longer than a couple minutes before waking up and returning to normal. If you get up too quickly before your body can recover, you may faint again. Some twitching or jerky movements may occur during the fainting spell.  DIAGNOSIS  Your health care provider will ask about your symptoms, take a medical history, and perform a physical exam. Various tests may be done to rule out other causes of fainting. These may include blood tests and tests to check the heart, such as electrocardiography, echocardiography, and possibly an electrophysiology study. When other causes have been ruled out, a test may be done to check the body's response to changes in position (tilt table  test). TREATMENT  Most cases of vasovagal syncope do not require treatment. Your health care provider may recommend ways to avoid fainting triggers and may provide home strategies for preventing fainting. If you must be exposed to a possible trigger, you can drink additional fluids to help reduce your chances of having an episode of vasovagal syncope. If you have warning signs of an oncoming  episode, you can respond by positioning yourself favorably (lying down). If your fainting spells continue, you may be given medicines to prevent fainting. Some medicines may help make you more resistant to repeated episodes of vasovagal syncope. Special exercises or compression stockings may be recommended. In rare cases, the surgical placement of a pacemaker is considered. HOME CARE INSTRUCTIONS   Learn to identify the warning signs of vasovagal syncope.   Sit or lie down at the first warning sign of a fainting spell. If sitting, put your head down between your legs. If you lie down, swing your legs up in the air to increase blood flow to the brain.   Avoid hot tubs and saunas.  Avoid prolonged standing.  Drink enough fluids to keep your urine clear or pale yellow. Avoid caffeine.  Increase salt in your diet as directed by your health care provider.   If you have to stand for a long time, perform movements such as:   Crossing your legs.   Flexing and stretching your leg muscles.   Squatting.   Moving your legs.   Bending over.   Only take over-the-counter or prescription medicines as directed by your health care provider. Do not suddenly stop any medicines without asking your health care provider first. SEEK MEDICAL CARE IF:   Your fainting spells continue or happen more frequently in spite of treatment.   You lose consciousness for more than a couple minutes.  You have fainting spells during or after exercising or after being startled.   You have new symptoms that occur with the fainting spells, such as:   Shortness of breath.  Chest pain.   Irregular heartbeat.   You have episodes of twitching or jerky movements that last longer than a few seconds.  You have episodes of twitching or jerky movements without obvious fainting. SEEK IMMEDIATE MEDICAL CARE IF:   You have injuries or bleeding after a fainting spell.   You have episodes of twitching or  jerky movements that last longer than 5 minutes.   You have more than one spell of twitching or jerky movements before returning to consciousness after fainting.   This information is not intended to replace advice given to you by your health care provider. Make sure you discuss any questions you have with your health care provider.   Document Released: 08/05/2012 Document Revised: 01/03/2015 Document Reviewed: 08/05/2012 [employer] Interactive Patient Education [employer]2016 [employer]

## 2016-01-15 NOTE — Progress Notes (Signed)
Cardiology Office Note    Date:  01/15/2016   ID:  Melanie Strong, DOB 20-Jul-1986, MRN [id number]  PCP:  No PCP Per Patient  Cardiologist: Lesleigh Noe, MD   Chief Complaint  Patient presents with  . Loss of Consciousness    History of Present Illness:  Melanie Strong is a 30 y.o. female evaluation of recurring nausea, diaphoresis, and near syncope.  The patient has been in her usual state of health until the past week when she began having episodes of nausea, diaphoresis, and profound weakness. She has been to the emergency room twice with the sensation. Laboratory evaluation including a pregnancy test is been unremarkable. She was referred for cardiac evaluation for unknown reasons. There is no prior history of heart disease. The electrocardiogram performed is essentially normal shortness of the T-wave pattern on 01/11/16. The QT was normal. In the remote past she has had a syncopal episode associated with nausea and diaphoresis (this occurred greater than 5 years ago).  No family history of vascular disease or sudden death.    Past Medical History  Diagnosis Date  . Glucose found in urine on examination   . No pertinent past medical history   . Infection     UTI    Past Surgical History  Procedure Laterality Date  . No past surgeries      Current Medications: Outpatient Prescriptions Prior to Visit  Medication Sig Dispense Refill  . ibuprofen (ADVIL,MOTRIN) 200 MG tablet Take 800 mg by mouth every 6 (six) hours as needed for moderate pain.    Marland Kitchen loratadine (CLARITIN) 10 MG tablet Take 10 mg by mouth daily as needed for allergies.     Marland Kitchen amoxicillin (AMOXIL) 500 MG capsule Take 2 capsules (1,000 mg total) by mouth 2 (two) times daily. (Patient not taking: Reported on 01/11/2016) 40 capsule 0  . fluconazole (DIFLUCAN) 150 MG tablet Take one tablet now.  If symptoms persist, take second tablet in 3 days. (Patient not taking: Reported on 01/11/2016) 2 tablet 0  .  ibuprofen (ADVIL,MOTRIN) 600 MG tablet Take 1 tablet (600 mg total) by mouth every 6 (six) hours as needed. (Patient not taking: Reported on 01/11/2016) 30 tablet 0  . metoCLOPramide (REGLAN) 10 MG tablet Take 1 tablet (10 mg total) by mouth every 6 (six) hours. (Patient not taking: Reported on 01/11/2016) 30 tablet 0   No facility-administered medications prior to visit.     Allergies:   Review of patient's allergies indicates no known allergies.   Social History   Social History  . Marital Status: Single    Spouse Name: N/A  . Number of Children: N/A  . Years of Education: N/A   Social History Main Topics  . Smoking status: Current Every Day Smoker -- 0.50 packs/day for 10 years    Types: Cigarettes  . Smokeless tobacco: Never Used  . Alcohol Use: No     Comment: rare  . Drug Use: No  . Sexual Activity: Yes    Birth Control/ Protection: None   Other Topics Concern  . None   Social History Narrative     Family History:  The patient's family history includes Cancer in her father and paternal aunt; Diabetes in her mother; Healthy in her brother and brother; Hypertension in her mother; Sickle cell anemia in her sister. There is no history of Other.   ROS:   Please see the history of present illness.    Atypical sharp shooting chest  discomfort. Recurring headaches.All other systems reviewed and are negative.   PHYSICAL EXAM:   VS:  BP 118/90 mmHg  Pulse 78  Ht 5\' 5"  (1.651 m)  Wt 126 lb (57.153 kg)  BMI 20.97 kg/m2  LMP 01/11/2016   GEN: Well nourished, well developed, in no acute distress HEENT: normal Neck: no JVD, carotid bruits, or masses Cardiac: RRR; no murmurs, rubs, or gallops,no edema  Respiratory:  clear to auscultation bilaterally, normal work of breathing GI: soft, nontender, nondistended, + BS MS: no deformity or atrophy Skin: warm and dry, no rash Neuro:  Alert and Oriented x 3, Strength and sensation are intact Psych: euthymic mood, full affect  Wt  Readings from Last 3 Encounters:  01/15/16 126 lb (57.153 kg)  07/23/15 124 lb (56.246 kg)  12/24/14 125 lb (56.7 kg)      Studies/Labs Reviewed:   EKG:  EKG  Normal sinus rhythm with juvenile T-wave abnormality as noted above.  Current Labs: 01/10/2016: ALT 12*; BUN 9; Creatinine, Ser 0.77; Hemoglobin 11.7*; Platelets 244; Potassium 4.8; Sodium 140   Lipid Panel No results found for: CHOL, TRIG, HDL, CHOLHDL, VLDL, LDLCALC, LDLDIRECT  Additional studies/ records that were reviewed today include:  Reviewed x-rays, EKGs, and laboratory data from recent emergency room visits. No significant abnormalities were noted.    ASSESSMENT:    1. Near syncope      PLAN:  In order of problems listed above:    The near-syncope that the patient is having is related to a vasovagal reflex. We recommended she lie down or at least sit down if nausea, sweating, and dizziness occurs. Use cold ice or cold rag on the forehead. Stay in the supine position until the symptoms resolve. If nausea continues to recur, she will need to be referred to gastroenterology for further evaluation of the source of nausea such as gastritis, peptic ulcer disease, or gallstone disease. I do not believe that there is any primary cardiac problem present.    Medication Adjustments/Labs and Tests Ordered: Current medicines are reviewed at length with the patient today.  Concerns regarding medicines are outlined above.  Medication changes, Labs and Tests ordered today are listed in the Patient Instructions below. Patient Instructions  Medication Instructions:  Your physician recommends that you continue on your current medications as directed. Please refer to the Current Medication list given to you today.   Labwork: None ordered  Testing/Procedures: None ordered  Follow-Up: Your physician recommends that you schedule a follow-up appointment as needed   Any Other Special Instructions Will Be Listed Below (If  Applicable). When episode occurs (Nausea/Sweaty) lay down or sit down. Use cold compress on your neck.  Follow up with your primary care physician or a gastroenterologist if you are still having recoccurring nausea     If you need a refill on your cardiac medications before your next appointment, please call your pharmacy.  Syncope, commonly known as fainting, is a temporary loss of consciousness. It occurs when the blood flow to the brain is reduced. Vasovagal syncope (also called neurocardiogenic syncope) is a fainting spell in which the blood flow to the brain is reduced because of a sudden drop in heart rate and blood pressure. Vasovagal syncope occurs when the brain and the cardiovascular system (blood vessels) do not adequately communicate and respond to each other. This is the most common cause of fainting. It often occurs in response to fear or some other type of emotional or physical stress. The body has a  reaction in which the heart starts beating too slowly or the blood vessels expand, reducing blood pressure. This type of fainting spell is generally considered harmless. However, injuries can occur if a person takes a sudden fall during a fainting spell.  CAUSES  Vasovagal syncope occurs when a person's blood pressure and heart rate decrease suddenly, usually in response to a trigger. Many things and situations can trigger an episode. Some of these include:   Pain.   Fear.   The sight of blood or medical procedures, such as blood being drawn from a vein.   Common activities, such as coughing, swallowing, stretching, or going to the bathroom.   Emotional stress.   Prolonged standing, especially in a warm environment.   Lack of sleep or rest.   Prolonged lack of food.   Prolonged lack of fluids.   Recent illness.  The use of certain drugs that affect blood pressure, such as cocaine, alcohol, marijuana, inhalants, and opiates.  SYMPTOMS  Before the fainting  episode, you may:   Feel dizzy or light headed.   Become pale.  Sense that you are going to faint.   Feel like the room is spinning.   Have tunnel vision, only seeing directly in front of you.   Feel sick to your stomach (nauseous).   See spots or slowly lose vision.   Hear ringing in your ears.   Have a headache.   Feel warm and sweaty.   Feel a sensation of pins and needles. During the fainting spell, you will generally be unconscious for no longer than a couple minutes before waking up and returning to normal. If you get up too quickly before your body can recover, you may faint again. Some twitching or jerky movements may occur during the fainting spell.  DIAGNOSIS  Your health care provider will ask about your symptoms, take a medical history, and perform a physical exam. Various tests may be done to rule out other causes of fainting. These may include blood tests and tests to check the heart, such as electrocardiography, echocardiography, and possibly an electrophysiology study. When other causes have been ruled out, a test may be done to check the body's response to changes in position (tilt table test). TREATMENT  Most cases of vasovagal syncope do not require treatment. Your health care provider may recommend ways to avoid fainting triggers and may provide home strategies for preventing fainting. If you must be exposed to a possible trigger, you can drink additional fluids to help reduce your chances of having an episode of vasovagal syncope. If you have warning signs of an oncoming episode, you can respond by positioning yourself favorably (lying down). If your fainting spells continue, you may be given medicines to prevent fainting. Some medicines may help make you more resistant to repeated episodes of vasovagal syncope. Special exercises or compression stockings may be recommended. In rare cases, the surgical placement of a pacemaker is considered. HOME CARE  INSTRUCTIONS   Learn to identify the warning signs of vasovagal syncope.   Sit or lie down at the first warning sign of a fainting spell. If sitting, put your head down between your legs. If you lie down, swing your legs up in the air to increase blood flow to the brain.   Avoid hot tubs and saunas.  Avoid prolonged standing.  Drink enough fluids to keep your urine clear or pale yellow. Avoid caffeine.  Increase salt in your diet as directed by your health care provider.  If you have to stand for a long time, perform movements such as:   Crossing your legs.   Flexing and stretching your leg muscles.   Squatting.   Moving your legs.   Bending over.   Only take over-the-counter or prescription medicines as directed by your health care provider. Do not suddenly stop any medicines without asking your health care provider first. SEEK MEDICAL CARE IF:   Your fainting spells continue or happen more frequently in spite of treatment.   You lose consciousness for more than a couple minutes.  You have fainting spells during or after exercising or after being startled.   You have new symptoms that occur with the fainting spells, such as:   Shortness of breath.  Chest pain.   Irregular heartbeat.   You have episodes of twitching or jerky movements that last longer than a few seconds.  You have episodes of twitching or jerky movements without obvious fainting. SEEK IMMEDIATE MEDICAL CARE IF:   You have injuries or bleeding after a fainting spell.   You have episodes of twitching or jerky movements that last longer than 5 minutes.   You have more than one spell of twitching or jerky movements before returning to consciousness after fainting.   This information is not intended to replace advice given to you by your health care provider. Make sure you discuss any questions you have with your health care provider.   Document Released: 08/05/2012 Document  Revised: 01/03/2015 Document Reviewed: 08/05/2012 Elsevier Interactive Patient Education [employer]        Signed, Lesleigh Noe, MD  01/15/2016 1:25 PM    [facility] [address] [place], [place], [place]  [address] Phone: [phone]; Fax: [phone]

## 2016-04-02 IMAGING — CR DG FINGER RING 2+V*L*
1 series · 1 of 1 positions shown · non-contrast
Comparison: None.

CLINICAL DATA: 28-year-old with glass puncture distal finger.
Initial encounter.

EXAM:
LEFT RING FINGER 2+V

[PA]
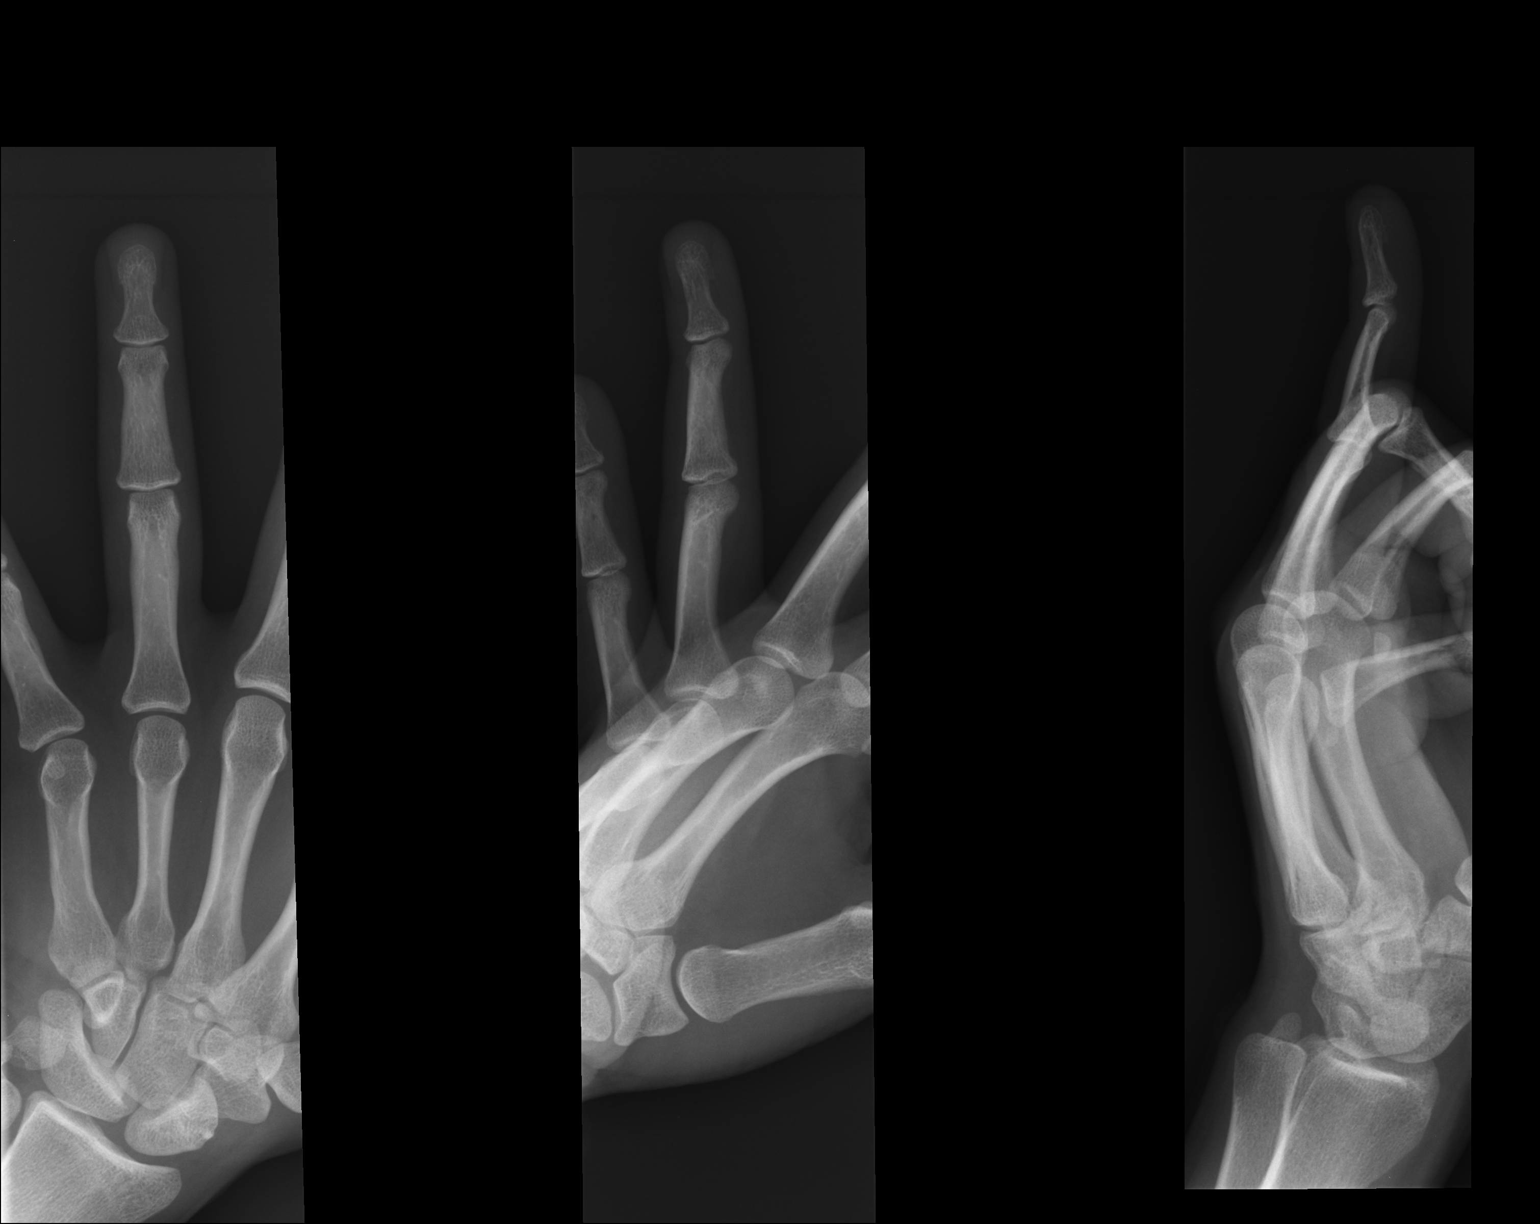

[1 of 1 positions shown; findings below may reference images not displayed]

FINDINGS: Soft tissue swelling left third distal phalanx level without
underlying fracture or discrete radiopaque foreign body. On the
lateral view, radiopaque structures which are noted may represent
artifact as these are not confirmed as radiopaque foreign bodies on
other views. No plain film evidence of osteomyelitis.
IMPRESSION: Soft tissue swelling left third distal phalanx region without
underlying fracture or radiopaque foreign body as discussed above

## 2016-04-23 ENCOUNTER — Encounter (HOSPITAL_COMMUNITY): Payer: Self-pay | Admitting: Emergency Medicine

## 2016-04-23 ENCOUNTER — Emergency Department (HOSPITAL_COMMUNITY)
Admission: EM | Admit: 2016-04-23 | Discharge: 2016-04-23 | Disposition: A | Discharge status: Home / Self Care | Payer: 59 | Attending: Emergency Medicine | Admitting: Emergency Medicine

## 2016-04-23 ENCOUNTER — Emergency Department (HOSPITAL_COMMUNITY): Payer: 59

## 2016-04-23 DIAGNOSIS — J069 Acute upper respiratory infection, unspecified: Secondary | ICD-10-CM | POA: Insufficient documentation

## 2016-04-23 DIAGNOSIS — F17210 Nicotine dependence, cigarettes, uncomplicated: Secondary | ICD-10-CM | POA: Diagnosis not present

## 2016-04-23 DIAGNOSIS — R072 Precordial pain: Secondary | ICD-10-CM | POA: Diagnosis present

## 2016-04-23 DIAGNOSIS — R059 Cough, unspecified: Secondary | ICD-10-CM

## 2016-04-23 DIAGNOSIS — R05 Cough: Secondary | ICD-10-CM

## 2016-04-23 LAB — I-STAT TROPONIN, ED: Troponin i, poc: 0 ng/mL (ref 0.00–0.08)

## 2016-04-23 LAB — CBC
HEMATOCRIT: 37.3 % (ref 36.0–46.0)
HEMOGLOBIN: 11.7 g/dL — AB (ref 12.0–15.0)
MCH: 26.5 pg (ref 26.0–34.0)
MCHC: 31.4 g/dL (ref 30.0–36.0)
MCV: 84.4 fL (ref 78.0–100.0)
Platelets: 233 10*3/uL (ref 150–400)
RBC: 4.42 MIL/uL (ref 3.87–5.11)
RDW: 13.9 % (ref 11.5–15.5)
WBC: 6 10*3/uL (ref 4.0–10.5)

## 2016-04-23 LAB — BASIC METABOLIC PANEL
ANION GAP: 7 (ref 5–15)
BUN: 8 mg/dL (ref 6–20)
CHLORIDE: 102 mmol/L (ref 101–111)
CO2: 28 mmol/L (ref 22–32)
Calcium: 9.7 mg/dL (ref 8.9–10.3)
Creatinine, Ser: 0.8 mg/dL (ref 0.44–1.00)
GFR calc non Af Amer: >60 mL/min (ref >60)
Glucose, Bld: 98 mg/dL (ref 65–99)
POTASSIUM: 4 mmol/L (ref 3.5–5.1)
SODIUM: 137 mmol/L (ref 135–145)

## 2016-04-23 LAB — I-STAT BETA HCG BLOOD, ED (MC, WL, AP ONLY)

## 2016-04-23 MED ORDER — AZITHROMYCIN 250 MG PO TABS: 250.0000 mg | ORAL_TABLET | Freq: Every day | ORAL | 0 refills | Status: AC

## 2016-04-23 MED ORDER — AZITHROMYCIN 250 MG PO TABS
500.0000 mg | ORAL_TABLET | Freq: Once | ORAL | Status: AC
  Administered 2016-04-23: 500 mg via ORAL
  Filled 2016-04-23: qty 2

## 2016-04-23 NOTE — ED Notes (Signed)
Resident at bedside.  

## 2016-04-23 NOTE — ED Provider Notes (Signed)
[facility]-EMERGENCY DEPT Provider Note   CSN: [id number] Arrival date & time: 04/23/16  1554     History   Chief Complaint Chief Complaint  Patient presents with  . Chest Pain  . Cough    HPI Melanie Strong is a 30 y.o. female.  The history is provided by the patient. No language interpreter was used.  Chest Pain   This is a new problem. The current episode started 6 to 12 hours ago. The problem occurs constantly. The problem has not changed since onset.The pain is associated with coughing. The pain is present in the substernal region (and throughout chest). The pain is moderate. The quality of the pain is described as dull. The pain does not radiate. Episode Length: since this am. Exacerbated by: nothing. Associated symptoms include cough and sputum production (white). Pertinent negatives include no abdominal pain, no back pain, no claudication, no diaphoresis, no exertional chest pressure, no fever, no hemoptysis, no irregular heartbeat, no lower extremity edema, no nausea, no near-syncope, no palpitations, no shortness of breath, no syncope, no vomiting and no weakness. Melanie Strong has tried nothing for the symptoms. Risk factors include smoking/tobacco exposure.  Pertinent negatives for past medical history include no seizures. Past medical history comments: non contributory  Pertinent negatives for family medical history include: no early MI and no sudden death.  Procedure history is negative for cardiac catheterization, echocardiogram, stress echo and exercise treadmill test.    Past Medical History:  Diagnosis Date  . Glucose found in urine on examination   . Infection    UTI  . No pertinent past medical history     Patient Active Problem List   Diagnosis Date Noted  . Near syncope 01/14/2016    Past Surgical History:  Procedure Laterality Date  . NO PAST SURGERIES      OB History    Gravida Para Term Preterm AB Living   0             SAB TAB Ectopic Multiple Live Births                     Home Medications    Prior to Admission medications   Medication Sig Start Date End Date Taking? Authorizing Provider  ibuprofen (ADVIL,MOTRIN) 200 MG tablet Take 800 mg by mouth every 6 (six) hours as needed for moderate pain.    Historical Provider, MD  loratadine (CLARITIN) 10 MG tablet Take 10 mg by mouth daily as needed for allergies.     Historical Provider, MD    Family History Family History  Problem Relation Age of Onset  . Other Neg Hx   . Hypertension Mother   . Diabetes Mother     Boarderline  . Cancer Paternal Aunt     brain  . Cancer Father     rectal  . Sickle cell anemia Sister   . Healthy Brother   . Healthy Brother     Social History Social History  Substance Use Topics  . Smoking status: Current Every Day Smoker    Packs/day: 0.50    Years: 10.00    Types: Cigarettes  . Smokeless tobacco: Never Used  . Alcohol use No     Comment: rare     Allergies   Review of patient's allergies indicates no known allergies.   Review of Systems Review of Systems  Constitutional: Negative for diaphoresis and fever.  HENT: Positive for congestion and rhinorrhea (clear). Negative for nosebleeds, sore throat and  trouble swallowing.   Eyes: Negative for discharge, redness and visual disturbance.  Respiratory: Positive for cough and sputum production (white). Negative for hemoptysis and shortness of breath.   Cardiovascular: Positive for chest pain. Negative for palpitations, claudication, syncope and near-syncope.  Gastrointestinal: Negative for abdominal pain, diarrhea, nausea and vomiting.  Genitourinary: Negative for dysuria and frequency.  Musculoskeletal: Negative for back pain and joint swelling.  Skin: Negative for rash.  Neurological: Negative for seizures, syncope, weakness and light-headedness.  Psychiatric/Behavioral: Negative.      Physical Exam Updated Vital Signs BP 122/82 (BP Location: Right Arm)   Pulse 91   Temp 98.1  F (36.7 C) (Oral)   Resp 18   SpO2 100%   Physical Exam  Constitutional: Melanie Strong is oriented to person, place, and time. Melanie Strong appears well-developed and well-nourished. No distress.  HENT:  Head: Normocephalic and atraumatic.  Eyes: Conjunctivae are normal. No scleral icterus.  Neck: Normal range of motion. Neck supple. No tracheal deviation present.  Cardiovascular: Normal rate, regular rhythm, normal heart sounds and intact distal pulses.   No murmur heard. Pulmonary/Chest: Effort normal and breath sounds normal. No stridor. No respiratory distress. Melanie Strong has no wheezes. Melanie Strong has no rales. Melanie Strong exhibits no tenderness.  Abdominal: Soft. Melanie Strong exhibits no distension. There is no tenderness. There is no rebound and no guarding.  Musculoskeletal: Melanie Strong exhibits no edema, tenderness or deformity.  Neurological: Melanie Strong is alert and oriented to person, place, and time. Melanie Strong is not disoriented. GCS eye subscore is 4. GCS verbal subscore is 5. GCS motor subscore is 6.  Skin: Skin is warm and dry. Capillary refill takes less than 2 seconds. No rash noted. Melanie Strong is not diaphoretic.  Psychiatric: Melanie Strong has a normal mood and affect. Her behavior is normal. Judgment and thought content normal.  Nursing note and vitals reviewed.    ED Treatments / Results  Labs (all labs ordered are listed, but only abnormal results are displayed) Labs Reviewed  CBC - Abnormal; Notable for the following:       Result Value   Hemoglobin 11.7 (*)    All other components within normal limits  BASIC METABOLIC PANEL  I-STAT TROPOININ, ED  I-STAT BETA HCG BLOOD, ED ([facility], [facility], [facility] ONLY)    EKG  EKG Interpretation  Date/Time:  Tuesday April 23 2016 15:59:29 EDT Ventricular Rate:  83 PR Interval:  148 QRS Duration: 80 QT Interval:  370 QTC Calculation: 434 R Axis:   53 Text Interpretation:  Normal sinus rhythm Normal ECG No significant change since last tracing Confirmed by YAO  MD, DAVID ([id number]) on 04/23/2016 6:03:28 PM        Radiology Dg Chest 2 View  Result Date: 04/23/2016 CLINICAL DATA:  Shortness of breath and mid chest pain since this morning; current smoker. EXAM: CHEST  2 VIEW COMPARISON:  CT scan of the chest of Jan 11, 2016 and chest x-ray of December 24, 2014. FINDINGS: The lungs are well-expanded and clear. The heart and pulmonary vascularity are normal. The mediastinum is normal in width. There is no pleural effusion. The bony thorax exhibits no acute abnormality. IMPRESSION: There is no active cardiopulmonary disease. Electronically Signed   By: David  [place] M.D.   On: 04/23/2016 16:57    Procedures Procedures (including critical care time)  Medications Ordered in ED Medications  azithromycin (ZITHROMAX) tablet 500 mg (500 mg Oral Given 04/23/16 1848)     Initial Impression / Assessment and Plan / ED Course  I have reviewed  the triage vital signs and the nursing notes.  Pertinent labs & imaging results that were available during my care of the patient were reviewed by me and considered in my medical decision making (see chart for details).  Clinical Course   Pt with no sig PMH presents to ED because of new onset chest pain, in the setting of URI and cough. Recent sick contacts. (+) URI symptoms. (-) RF for ACS. PERC neg. Not on Bc, no hx of DVT/PE/cancer. Pain is not pleuritic. Not reproducible to palp, thus unlikely to be 2/2 trauma. No hx of trauma. Likely related to bouts of coughing pt has been having. HEART score is 0. Pulse equal in all extremities. CXR WNL. Doubt dissection. No PNA/PTX either. No pedal edema. EKG with no ST segment changes, so doubt ACS or pericarditis. Doubt life-threatening cause. Feel comfortable d/c'ing patient. Since patient is a smoker, Melanie Strong is at a higher risk of having an atypical PNA. Thus will rx Zpack to concurrently PPX for ATX PNA in addition to recommending supportive care for URI. Discussed findings and results with patient, who states understanding and  agreement with plan. Gave usual and customary return precautions for chest pain, URI and cough. Pt and VS stable at dc.   Final Clinical Impressions(s) / ED Diagnoses   Final diagnoses:  URI (upper respiratory infection)  Cough    New Prescriptions Discharge Medication List as of 04/23/2016  6:43 PM    START taking these medications   Details  azithromycin (ZITHROMAX Z-PAK) 250 MG tablet Take 1 tablet (250 mg total) by mouth daily., Starting Tue 04/23/2016, Until Sat 04/27/2016, Print         Maretta BeesLouis Nica Friske, MD 04/26/16 0425    Charlynne Panderavid Hsienta Yao, MD 04/27/16 2009

## 2016-04-23 NOTE — ED Triage Notes (Signed)
Pt sts mid sternal CP worse with cough

## 2016-09-06 ENCOUNTER — Emergency Department (HOSPITAL_COMMUNITY)
Admission: EM | Admit: 2016-09-06 | Discharge: 2016-09-06 | Disposition: A | Discharge status: Home / Self Care | Payer: 59 | Attending: Emergency Medicine | Admitting: Emergency Medicine

## 2016-09-06 ENCOUNTER — Encounter (HOSPITAL_COMMUNITY): Payer: Self-pay

## 2016-09-06 DIAGNOSIS — Y9389 Activity, other specified: Secondary | ICD-10-CM | POA: Insufficient documentation

## 2016-09-06 DIAGNOSIS — Y929 Unspecified place or not applicable: Secondary | ICD-10-CM | POA: Diagnosis not present

## 2016-09-06 DIAGNOSIS — F17210 Nicotine dependence, cigarettes, uncomplicated: Secondary | ICD-10-CM | POA: Diagnosis not present

## 2016-09-06 DIAGNOSIS — Y990 Civilian activity done for income or pay: Secondary | ICD-10-CM | POA: Insufficient documentation

## 2016-09-06 DIAGNOSIS — X500XXA Overexertion from strenuous movement or load, initial encounter: Secondary | ICD-10-CM | POA: Insufficient documentation

## 2016-09-06 DIAGNOSIS — M549 Dorsalgia, unspecified: Secondary | ICD-10-CM | POA: Diagnosis present

## 2016-09-06 DIAGNOSIS — M546 Pain in thoracic spine: Secondary | ICD-10-CM | POA: Diagnosis not present

## 2016-09-06 MED ORDER — ACETAMINOPHEN 325 MG PO TABS
650.0000 mg | ORAL_TABLET | Freq: Once | ORAL | Status: AC
  Administered 2016-09-06: 650 mg via ORAL
  Filled 2016-09-06: qty 2

## 2016-09-06 MED ORDER — METHOCARBAMOL 500 MG PO TABS: 500.0000 mg | ORAL_TABLET | Freq: Two times a day (BID) | ORAL | 0 refills | Status: DC | PRN

## 2016-09-06 MED ORDER — NAPROXEN 500 MG PO TABS: 500.0000 mg | ORAL_TABLET | Freq: Two times a day (BID) | ORAL | 0 refills | Status: DC

## 2016-09-06 NOTE — ED Triage Notes (Signed)
Per Pt, Pt is coming from home with complaints of mid-back pain. Denies any falls or MVC. Pt was able to walk to triage. Denies any numbness or tingling, denies incontinence.

## 2016-09-06 NOTE — ED Notes (Signed)
C/o back  pain onset yest . States she was sitting on the couch and noticed her back hurting, states the pain is between her shoulder blades.

## 2016-09-06 NOTE — ED Provider Notes (Signed)
[facility]-EMERGENCY DEPT Provider Note   CSN: [id number] Arrival date & time: 09/06/16  1329   By signing my name below, I, Melanie Strong, attest that this documentation has been prepared under the direction and in the presence of Melanie Farrier, PA-C Electronically Signed: Cynda Strong, Scribe. 09/06/16. 2:33 PM.   History   Chief Complaint Chief Complaint  Patient presents with  . Back Pain   HPI Comments: Melanie Strong is a 31 y.o. female who presents to the Emergency Department complaining of mid-back pain that began yesterday night. Patient states she does a lot of heavy lifting at work at [employer].  Patient states the pain is worse with movement. The pain is  better when sitting still. No modifying factors indicated. LNMP began Sunday the 31st. She denies any recent falls, cough, SOB, fever, urinary symptoms, abdominal pain, numbness, IV drug use, tingling, or weakness.   Does a lot of lifting at work better when sitting still   The history is provided by the patient. No language interpreter was used.    Past Medical History:  Diagnosis Date  . Glucose found in urine on examination   . Infection    UTI  . No pertinent past medical history     Patient Active Problem List   Diagnosis Date Noted  . Near syncope 01/14/2016    Past Surgical History:  Procedure Laterality Date  . NO PAST SURGERIES      OB History    Gravida Para Term Preterm AB Living   0             SAB TAB Ectopic Multiple Live Births                   Home Medications    Prior to Admission medications   Medication Sig Start Date End Date Taking? Authorizing Provider  loratadine (CLARITIN) 10 MG tablet Take 10 mg by mouth daily as needed for allergies.     Historical Provider, MD  methocarbamol (ROBAXIN) 500 MG tablet Take 1 tablet (500 mg total) by mouth 2 (two) times daily as needed for muscle spasms. 09/06/16   Melanie Farrier, PA-C  naproxen (NAPROSYN) 500 MG tablet Take 1 tablet (500 mg  total) by mouth 2 (two) times daily with a meal. 09/06/16   Melanie Farrier, PA-C    Family History Family History  Problem Relation Age of Onset  . Hypertension Mother   . Diabetes Mother     Boarderline  . Cancer Paternal Aunt     brain  . Cancer Father     rectal  . Sickle cell anemia Sister   . Healthy Brother   . Healthy Brother   . Other Neg Hx     Social History Social History  Substance Use Topics  . Smoking status: Current Every Day Smoker    Packs/day: 0.50    Years: 10.00    Types: Cigarettes  . Smokeless tobacco: Never Used  . Alcohol use No     Comment: rare     Allergies   Patient has no known allergies.   Review of Systems Review of Systems  Constitutional: Negative for fever.  Respiratory: Negative for cough and shortness of breath.   Cardiovascular: Negative for chest pain.  Gastrointestinal: Negative for abdominal pain, nausea and vomiting.  Genitourinary: Negative for difficulty urinating, dysuria, frequency, hematuria, menstrual problem and urgency.  Musculoskeletal: Positive for back pain. Negative for neck pain.  Skin: Negative for rash and wound.  Neurological: Negative for syncope, weakness and numbness.     Physical Exam Updated Vital Signs BP 125/84 (BP Location: Left Arm)   Pulse 85   Temp 98.5 F (36.9 C) (Oral)   Resp 18   Ht 5\' 5"  (1.651 m)   Wt 56.7 kg   SpO2 100%   BMI 20.80 kg/m   Physical Exam  Constitutional: She appears well-developed and well-nourished. No distress.  Nontoxic appearing.   HENT:  Head: Normocephalic and atraumatic.  Eyes: Right eye exhibits no discharge. Left eye exhibits no discharge.  Cardiovascular: Normal rate, regular rhythm and intact distal pulses.   Pulmonary/Chest: Effort normal. No respiratory distress.  Abdominal: Soft. There is no tenderness. There is no guarding.  Musculoskeletal: Normal range of motion. She exhibits tenderness. She exhibits no edema or deformity.  Tenderness across  her bilateral thoracic musculature near her bra line. No midline neck or back tenderness. No back erythema, deformity, ecchymosis or warmth. Good strength in her bilateral lower extremities.  Neurological: She is alert. She displays normal reflexes. No sensory deficit. Coordination normal.  Normal gait.  Bilateral DTRs are intact.  Sensation intact bilaterally.   Skin: Skin is warm and dry. Capillary refill takes less than 2 seconds. No rash noted. She is not diaphoretic. No erythema. No pallor.  Psychiatric: She has a normal mood and affect. Her behavior is normal.  Nursing note and vitals reviewed.    ED Treatments / Results  DIAGNOSTIC STUDIES: Oxygen Saturation is 100% on RA, normal by my interpretation.    COORDINATION OF CARE: 2:31 PM Discussed treatment plan with pt at bedside and pt agreed to plan.  Labs (all labs ordered are listed, but only abnormal results are displayed) Labs Reviewed - No data to display  EKG  EKG Interpretation None       Radiology No results found.  Procedures Procedures (including critical care time)  Medications Ordered in ED Medications  acetaminophen (TYLENOL) tablet 650 mg (not administered)     Initial Impression / Assessment and Plan / ED Course  I have reviewed the triage vital signs and the nursing notes.  Pertinent labs & imaging results that were available during my care of the patient were reviewed by me and considered in my medical decision making (see chart for details).  Clinical Course    This is a 31 y.o. female who presents to the Emergency Department complaining of mid-back pain that began yesterday night. Patient states she does a lot of heavy lifting at work at [employer].  Patient states the pain is worse with movement. The pain is  better when sitting still.  No fevers, no coughing, no shortness of breath, no abdominal pain.  Patient with back pain.  No neurological deficits and normal neuro exam.  Normal gait.  No  loss of bowel or bladder control.  No concern for cauda equina.  No fever, night sweats, weight loss, h/o cancer, IVDU.  We'll treat with naproxen and Robaxin. Also discussed back exercises and proper lifting techniques to help prevent back injury. I advised the patient to follow-up with their primary care provider this week. I advised the patient to return to the emergency department with new or worsening symptoms or new concerns. The patient verbalized understanding and agreement with plan.    Final Clinical Impressions(s) / ED Diagnoses   Final diagnoses:  Acute bilateral thoracic back pain    New Prescriptions New Prescriptions   METHOCARBAMOL (ROBAXIN) 500 MG TABLET    Take 1  tablet (500 mg total) by mouth 2 (two) times daily as needed for muscle spasms.   NAPROXEN (NAPROSYN) 500 MG TABLET    Take 1 tablet (500 mg total) by mouth 2 (two) times daily with a meal.   I personally performed the services described in this documentation, which was scribed in my presence. The recorded information has been reviewed and is accurate.       Melanie Farrier, PA-C 09/06/16 1443    Arby Barrette, MD 09/06/16 [phone]

## 2019-02-15 ENCOUNTER — Emergency Department (HOSPITAL_COMMUNITY)
Admission: EM | Admit: 2019-02-15 | Discharge: 2019-02-15 | Disposition: A | Discharge status: Home / Self Care | Payer: BC Managed Care – PPO | Attending: Emergency Medicine | Admitting: Emergency Medicine

## 2019-02-15 ENCOUNTER — Encounter (HOSPITAL_COMMUNITY): Payer: Self-pay | Admitting: Emergency Medicine

## 2019-02-15 DIAGNOSIS — F17210 Nicotine dependence, cigarettes, uncomplicated: Secondary | ICD-10-CM | POA: Diagnosis not present

## 2019-02-15 DIAGNOSIS — R51 Headache: Secondary | ICD-10-CM | POA: Insufficient documentation

## 2019-02-15 DIAGNOSIS — R519 Headache, unspecified: Secondary | ICD-10-CM

## 2019-02-15 LAB — I-STAT BETA HCG BLOOD, ED (MC, WL, AP ONLY): I-stat hCG, quantitative: <5 m[IU]/mL (ref <5)

## 2019-02-15 LAB — CBC
HCT: 32.2 % — ABNORMAL LOW (ref 36.0–46.0)
Hemoglobin: 10.2 g/dL — ABNORMAL LOW (ref 12.0–15.0)
MCH: 26.8 pg (ref 26.0–34.0)
MCHC: 31.7 g/dL (ref 30.0–36.0)
MCV: 84.7 fL (ref 80.0–100.0)
Platelets: 243 10*3/uL (ref 150–400)
RBC: 3.8 MIL/uL — ABNORMAL LOW (ref 3.87–5.11)
RDW: 13.9 % (ref 11.5–15.5)
WBC: 4.9 10*3/uL (ref 4.0–10.5)
nRBC: 0 % (ref 0.0–0.2)

## 2019-02-15 LAB — BASIC METABOLIC PANEL
Anion gap: 8 (ref 5–15)
BUN: 10 mg/dL (ref 6–20)
CO2: 21 mmol/L — ABNORMAL LOW (ref 22–32)
Calcium: 8.9 mg/dL (ref 8.9–10.3)
Chloride: 108 mmol/L (ref 98–111)
Creatinine, Ser: 0.78 mg/dL (ref 0.44–1.00)
GFR calc Af Amer: >60 mL/min (ref >60)
GFR calc non Af Amer: >60 mL/min (ref >60)
Glucose, Bld: 87 mg/dL (ref 70–99)
Potassium: 3.8 mmol/L (ref 3.5–5.1)
Sodium: 137 mmol/L (ref 135–145)

## 2019-02-15 MED ORDER — KETOROLAC TROMETHAMINE 15 MG/ML IJ SOLN
15.0000 mg | Freq: Once | INTRAMUSCULAR | Status: AC
  Administered 2019-02-15: 14:00:00 15 mg via INTRAVENOUS
  Filled 2019-02-15: qty 1

## 2019-02-15 MED ORDER — DIPHENHYDRAMINE HCL 50 MG/ML IJ SOLN
25.0000 mg | Freq: Once | INTRAMUSCULAR | Status: AC
  Administered 2019-02-15: 25 mg via INTRAVENOUS
  Filled 2019-02-15: qty 1

## 2019-02-15 MED ORDER — SODIUM CHLORIDE 0.9 % IV BOLUS
1000.0000 mL | Freq: Once | INTRAVENOUS | Status: AC
  Administered 2019-02-15: 14:00:00 1000 mL via INTRAVENOUS

## 2019-02-15 MED ORDER — ACETAMINOPHEN 500 MG PO TABS
1000.0000 mg | ORAL_TABLET | Freq: Once | ORAL | Status: AC
  Administered 2019-02-15: 1000 mg via ORAL
  Filled 2019-02-15: qty 2

## 2019-02-15 MED ORDER — DEXAMETHASONE SODIUM PHOSPHATE 10 MG/ML IJ SOLN
4.0000 mg | Freq: Once | INTRAMUSCULAR | Status: AC
  Administered 2019-02-15: 4 mg via INTRAVENOUS
  Filled 2019-02-15: qty 1

## 2019-02-15 MED ORDER — PROMETHAZINE HCL 25 MG/ML IJ SOLN
25.0000 mg | Freq: Once | INTRAMUSCULAR | Status: AC
  Administered 2019-02-15: 16:00:00 25 mg via INTRAVENOUS
  Filled 2019-02-15: qty 1

## 2019-02-15 NOTE — ED Notes (Signed)
Pt verbalized understanding of d/c instructions and has no further questions, VSS, NAD.  

## 2019-02-15 NOTE — Discharge Instructions (Addendum)
As we discussed please make sure that you are wearing your glasses all the time.  If it has been more than one year since your last vision exam please see your eye doctor.    Today you received medications that may make you sleepy or impair your ability to make decisions.  For the next 24 hours please do not drive, operate heavy machinery, care for a small child with out another adult present, or perform any activities that may cause harm to you or someone else if you were to fall asleep or be impaired.   Your blood pressure was not consistently elevated while in the emergency room.  If you are concerned and your blood pressure is elevated at home please follow up with primary doctor.  I would also recommend limiting salt in your diet.

## 2019-02-15 NOTE — ED Triage Notes (Signed)
Pt in with sharp HA sudden onset yesterday, states her BP was 142/110 PTA, high for her. Denies hx of migraines or HTN

## 2019-02-15 NOTE — ED Provider Notes (Signed)
[facility] Provider [facility]   CSN: [id number] Arrival date & time: 02/15/19  1239    History   Chief Complaint Chief Complaint  Patient presents with  . Hypertension  . Headache    HPI Melanie Strong is a 33 y.o. female with no significant past medical history who presents today for evaluation of headache.  She reports that in the past she has had a similar headache and generally feeling unwell and when she checked her blood pressure at that time it was high.  She reports that her blood pressure started yesterday morning after she got up and is in the front of her head/face.  She has attempted ibuprofen 800 mg 3 times yesterday and once this morning at about 7 AM which initially provided mild relief however now is not helping.  She has not tried any other interventions.  She reports that her headache started as mild and has been gradually getting worse since.  She denies any recent trauma.  When she checked her blood pressure it was high for her at 145/110.  She denies any visual changes.  No nausea vomiting or diarrhea.  No weakness, chest pain, shortness of breath.  She denies any nasal congestion, worsening allergy symptoms, or changes in medication.  No personal or family history of aneurysms.       HPI  Past Medical History:  Diagnosis Date  . Glucose found in urine on examination   . Infection    UTI  . No pertinent past medical history     Patient Active Problem List   Diagnosis Date Noted  . Near syncope 01/14/2016    Past Surgical History:  Procedure Laterality Date  . NO PAST SURGERIES       OB History    Gravida  0   Para      Term      Preterm      AB      Living        SAB      TAB      Ectopic      Multiple      Live Births               Home Medications    Prior to Admission medications   Medication Sig Start Date End Date Taking? Authorizing Provider  Ibuprofen 200 MG CAPS Take 200 mg by  mouth daily as needed.   Yes [provider]  methocarbamol (ROBAXIN) 500 MG tablet Take 1 tablet (500 mg total) by mouth 2 (two) times daily as needed for muscle spasms. Patient not taking: Reported on 02/15/2019 09/06/16   Everlene Farrieransie, William, PA-C  naproxen (NAPROSYN) 500 MG tablet Take 1 tablet (500 mg total) by mouth 2 (two) times daily with a meal. Patient not taking: Reported on 02/15/2019 09/06/16   Everlene Farrieransie, William, PA-C    Family History Family History  Problem Relation Age of Onset  . Hypertension Mother   . Diabetes Mother        Boarderline  . Cancer Paternal Aunt        brain  . Cancer Father        rectal  . Sickle cell anemia Sister   . Healthy Brother   . Healthy Brother   . Other Neg Hx     Social History Social History   Tobacco Use  . Smoking status: Current Every Day Smoker    Packs/day: 0.50  Years: 10.00    Pack years: 5.00    Types: Cigarettes  . Smokeless tobacco: Never Used  Substance Use Topics  . Alcohol use: No    Comment: rare  . Drug use: No     Allergies   Patient has no known allergies.   Review of Systems Review of Systems  Constitutional: Negative for chills and fever.  Eyes: Negative for visual disturbance.  Musculoskeletal: Negative for neck pain.  Neurological: Positive for headaches. Negative for dizziness, syncope, facial asymmetry, speech difficulty, weakness and numbness.  All other systems reviewed and are negative.    Physical Exam Updated Vital Signs BP 134/88 (BP Location: Right Arm)   Pulse 60   Temp 98.6 F (37 C) (Oral)   Resp 16   Wt 56.7 kg   LMP 02/15/2019   SpO2 100%   BMI 20.80 kg/m   Physical Exam Vitals signs and nursing note reviewed.  Constitutional:      General: She is not in acute distress.    Appearance: She is well-developed. She is not diaphoretic.  HENT:     Head: Normocephalic and atraumatic.     Right Ear: Tympanic membrane, ear canal and external ear normal.     Left Ear:  Tympanic membrane, ear canal and external ear normal.     Nose: No nasal tenderness or congestion.     Right Sinus: No maxillary sinus tenderness or frontal sinus tenderness.     Left Sinus: No maxillary sinus tenderness or frontal sinus tenderness.     Comments: No tenderness to percussion over bilateral frontal and maxillary sinuses.  There is tenderness to palpation over the brow ridge bilaterally which both re-creates and exacerbates her reported headache pain.    Mouth/Throat:     Lips: Pink.     Mouth: Mucous membranes are moist.     Tongue: Tongue does not deviate from midline.     Pharynx: Oropharynx is clear. Uvula midline.     Tonsils: No tonsillar exudate or tonsillar abscesses.  Eyes:     General: No scleral icterus.       Right eye: No discharge.        Left eye: No discharge.     Extraocular Movements: Extraocular movements intact.     Conjunctiva/sclera: Conjunctivae normal.     Pupils: Pupils are equal, round, and reactive to light.  Neck:     Musculoskeletal: Full passive range of motion without pain, normal range of motion and neck supple. No neck rigidity, pain with movement, spinous process tenderness or muscular tenderness.     Trachea: Trachea and phonation normal.     Comments: Full pain free AROM.  Cardiovascular:     Rate and Rhythm: Normal rate and regular rhythm.  Pulmonary:     Effort: Pulmonary effort is normal. No respiratory distress.     Breath sounds: No stridor.  Abdominal:     General: There is no distension.  Musculoskeletal:        General: No swelling, tenderness or deformity.  Lymphadenopathy:     Cervical: No cervical adenopathy.  Skin:    General: Skin is warm and dry.  Neurological:     Mental Status: She is alert and oriented to person, place, and time.     Motor: No abnormal muscle tone.     Comments: Mental Status:  Alert, oriented, thought content appropriate, able to give a coherent history. Speech fluent without evidence of  aphasia. Able to follow 2 step commands  without difficulty.  Cranial Nerves:  II:  Peripheral visual fields grossly normal, pupils equal, round, reactive to light III,IV, VI: ptosis not present, extra-ocular motions intact bilaterally  V,VII: smile symmetric, facial light touch sensation equal VIII: hearing grossly normal to voice  X: uvula elevates symmetrically  XI: bilateral shoulder shrug symmetric and strong XII: midline tongue extension without fassiculations Motor:  Normal tone. 5/5 in upper and lower extremities bilaterally including strong and equal grip strength and dorsiflexion/plantar flexion Gait: normal gait and balance CV: distal pulses palpable throughout    Psychiatric:        Mood and Affect: Mood normal.        Speech: Speech normal.        Behavior: Behavior normal.      ED Treatments / Results  Labs (all labs ordered are listed, but only abnormal results are displayed) Labs Reviewed  BASIC METABOLIC PANEL - Abnormal; Notable for the following components:      Result Value   CO2 21 (*)    All other components within normal limits  CBC - Abnormal; Notable for the following components:   RBC 3.80 (*)    Hemoglobin 10.2 (*)    HCT 32.2 (*)    All other components within normal limits  I-STAT BETA HCG BLOOD, ED ([facility], [facility], [facility] ONLY)    EKG None  Radiology No results found.  Procedures Procedures (including critical care time)  Medications Ordered in ED Medications  sodium chloride 0.9 % bolus 1,000 mL (0 mLs Intravenous Stopped 02/15/19 1709)  ketorolac (TORADOL) 15 MG/ML injection 15 mg (15 mg Intravenous Given 02/15/19 1416)  acetaminophen (TYLENOL) tablet 1,000 mg (1,000 mg Oral Given 02/15/19 1416)  promethazine (PHENERGAN) injection 25 mg (25 mg Intravenous Given 02/15/19 1541)  diphenhydrAMINE (BENADRYL) injection 25 mg (25 mg Intravenous Given 02/15/19 1541)  dexamethasone (DECADRON) injection 4 mg (4 mg Intravenous Given 02/15/19 1540)      Initial Impression / Assessment and Plan / ED Course  I have reviewed the triage vital signs and the nursing notes.  Pertinent labs & imaging results that were available during my care of the patient were reviewed by me and considered in my medical decision making (see chart for details).  Clinical Course as of Feb 14 2117  Mon Feb 15, 2019  1529 Patient reevaluated, she says that the fluids and Toradol eased her headache off however it still hurts.  At this point she is willing to get a ride home so that she can have additional medications.   [EH]  1630 Was informed that patient feels better and wishes to go home.   [EH]  1633 Patient reevaluated, she reports that her pain is currently 0 and wishes for discharge.  Discussed that she is mildly anemic.   [EH]    Clinical Course User Index [EH] Cristina GongHammond, Lavontay Kirk W, PA-C      Patient presents today for evaluation of headache that is been going on for 2 days with concern of hypertension.  While in the emergency room she was not significantly hypertensive, highest blood pressure was in the 140s systolic.  She is afebrile and not tachycardic.  On exam she had reproducible pain with palpation along the brow ridge bilaterally which she attributes as her headache.  She is neurologically intact.  She and I discussed evaluation and treatment options.  She was given enough information to make an informed decision in her health care.  She elected for labs and migraine cocktail.  Given that she is not truly hypertensive, her headache was not a thunderclap/sudden onset and is been gradual along with the ability to re-create it with palpation over her brows bilaterally do not suspect subarachnoid hemorrhage.  She was initially reluctant to take any medicine that could make her sleepy as she wished to drive home and was treated with Toradol and IV fluids after which her head ache did not significantly improve.  She then was able to obtain a ride home and was  treated with Benadryl and Phenergan after which her headache resolved.  She was given Tylenol for her headache, and she was given Decadron to help prevent rebound headache.  Return precautions were discussed with patient who states their understanding.  At the time of discharge patient denied any unaddressed complaints or concerns.  Patient is agreeable for discharge home.   Final Clinical Impressions(s) / ED Diagnoses   Final diagnoses:  Acute nonintractable headache, unspecified headache type    ED Discharge Orders    None       Norman ClayHammond, Aaleah Hirsch W, PA-C 02/15/19 2118    Sabas SousBero, Michael M, MD 02/16/19 [phone]

## 2019-10-13 ENCOUNTER — Ambulatory Visit (HOSPITAL_COMMUNITY)
Admission: EM | Admit: 2019-10-13 | Discharge: 2019-10-13 | Disposition: A | Discharge status: Home / Self Care | Payer: BC Managed Care – PPO | Attending: Family Medicine | Admitting: Family Medicine

## 2019-10-13 ENCOUNTER — Other Ambulatory Visit: Payer: Self-pay

## 2019-10-13 ENCOUNTER — Encounter (HOSPITAL_COMMUNITY): Payer: Self-pay

## 2019-10-13 DIAGNOSIS — M62830 Muscle spasm of back: Secondary | ICD-10-CM

## 2019-10-13 DIAGNOSIS — M546 Pain in thoracic spine: Secondary | ICD-10-CM

## 2019-10-13 MED ORDER — CYCLOBENZAPRINE HCL 10 MG PO TABS: ORAL_TABLET | ORAL | 0 refills | Status: DC

## 2019-10-13 MED ORDER — TRAMADOL HCL 50 MG PO TABS: 50.0000 mg | ORAL_TABLET | Freq: Four times a day (QID) | ORAL | 0 refills | Status: DC | PRN

## 2019-10-13 MED ORDER — PREDNISONE 10 MG (21) PO TBPK: ORAL_TABLET | Freq: Every day | ORAL | 0 refills | Status: DC

## 2019-10-13 NOTE — ED Triage Notes (Signed)
Pt reports having back pain "all over" x 2 weeks. Pt is taking ibuprofen without relief.

## 2019-10-13 NOTE — Discharge Instructions (Addendum)
Be aware, you have been prescribed medications that may cause drowsiness. Do not combine with alcohol or other illicit drugs. Please do not drive, operate heavy machinery, or take part in activities that require making important decisions while on this medication as your judgement may be clouded.  HOME CARE INSTRUCTIONS: For many people, back pain returns. Since back pain is rarely dangerous, it is often a condition that people can learn to manage on their own. Please remain active. It is stressful on the back to sit or stand in one place. Do not sit, drive, or stand in one place for more than 30 minutes at a time. Take short walks on level surfaces as soon as pain allows. Try to increase the length of time you walk each day. Do not stay in bed. Resting more than 1 or 2 days can delay your recovery. Do not avoid exercise or work. Your body is made to move. It is not dangerous to be active, even though your back may hurt. Your back will likely heal faster if you return to being active before your pain is gone. Over-the-counter medicines to reduce pain and inflammation are often the most helpful.  SEEK MEDICAL CARE IF: You have pain that is not relieved with rest or medicine. You have pain that does not improve in 1 week. You have new symptoms. You are generally not feeling well.  SEEK IMMEDIATE MEDICAL CARE IF: You have pain that radiates from your back into your legs. You develop new bowel or bladder control problems. You have unusual weakness or numbness in your arms or legs. You develop nausea or vomiting. You develop abdominal pain. You feel faint.

## 2019-10-13 NOTE — ED Provider Notes (Signed)
[facility]   [id number] 10/13/19 Arrival Time: 1238  ASSESSMENT & PLAN:  1. Acute bilateral thoracic back pain   2. Muscle spasm of back     See AVS for back pain d/c instructions. Able to ambulate here and hemodynamically stable. No indication for imaging of back at this time given no trauma and normal neurological exam. Discussed.  Begin trial of: Meds ordered this encounter  Medications   cyclobenzaprine (FLEXERIL) 10 MG tablet    Sig: Take 1 tablet by mouth 3 times daily as needed for muscle spasm. Warning: May cause drowsiness.    Dispense:  21 tablet    Refill:  0   predniSONE (STERAPRED UNI-PAK 21 TAB) 10 MG (21) TBPK tablet    Sig: Take by mouth daily. Take as directed.    Dispense:  21 tablet    Refill:  0   traMADol (ULTRAM) 50 MG tablet    Sig: Take 1 tablet (50 mg total) by mouth every 6 (six) hours as needed.    Dispense:  15 tablet    Refill:  0    Medication sedation precautions given. Encourage ROM/movement as tolerated.  Recommend: Follow-up Information    [facility].   Why: If worsening or failing to improve as anticipated. Contact information: [address] [place] [address] [phone]         [place] Controlled Substances Registry consulted for this patient. I feel the risk/benefit ratio today is favorable for proceeding with this prescription for a controlled substance. Medication sedation precautions given.  Reviewed expectations re: course of current medical issues. Questions answered. Outlined signs and symptoms indicating need for more acute intervention. Patient verbalized understanding. After Visit Summary given.   SUBJECTIVE: History from: patient.  Melanie Strong is a 34 y.o. female who presents with complaint of fairly persistent bilateral mid back discomfort. Onset gradual. First noted approx 2 w ago. Injury/trama: none. History of back problems requiring  medical care: occasional in distant past. Pain described as dull/aching with occasional sharp exacerbations; without radiation. Aggravating factors: certain movements and prolonged walking/standing. Alleviating factors: have not been identified. Progressive LE weakness or saddle anesthesia: none. Extremity sensation changes or weakness: none. Ambulatory without difficulty. Normal bowel/bladder habits: yes; without urinary retention. Normal PO intake without n/v. No associated abdominal pain/n/v. Self treatment: has NSAID, with no relief.  Reports no chronic steroid use, fevers, IV drug use, or recent back surgeries or procedures.  ROS: As per HPI. All other systems negative.   OBJECTIVE:  Vitals:   10/13/19 1253  BP: 136/78  Pulse: 89  Resp: 17  Temp: 98.6 F (37 C)  TempSrc: Oral  SpO2: 100%    General appearance: alert; no distress HEENT: [place]; AT Neck: supple with FROM; without midline tenderness CV: RRR Lungs: unlabored respirations; symmetrical air entry Abdomen: soft, non-tender; non-distended Back: moderate and poorly localized tenderness to palpation over bilateral thoracic paraspinal musculature; FROM at waist; without midline tenderness Extremities: without edema; symmetrical without gross deformities; normal ROM of bilateral LE Skin: warm and dry Neurologic: normal gait; normal sensation and strength of bilateral LE Psychological: alert and cooperative; normal mood and affect    No Known Allergies  Past Medical History:  Diagnosis Date   Glucose found in urine on examination    Infection    UTI   No pertinent past medical history    Social History   Socioeconomic History   Marital status: Single  Spouse name: Not on file   Number of children: Not on file   Years of education: Not on file   Highest education level: Not on file  Occupational History   Not on file  Tobacco Use   Smoking status: Current Every Day Smoker    Packs/day: 0.50     Years: 10.00    Pack years: 5.00    Types: Cigarettes   Smokeless tobacco: Never Used  Substance and Sexual Activity   Alcohol use: No    Comment: rare   Drug use: No   Sexual activity: Yes    Birth control/protection: None  Other Topics Concern   Not on file  Social History Narrative   Not on file   Social Determinants of Health   Financial Resource Strain:    Difficulty of Paying Living Expenses: Not on file  Food Insecurity:    Worried About Charity fundraiser in the Last Year: Not on file   [employer] of Food in the Last Year: Not on file  Transportation Needs:    Lack of Transportation (Medical): Not on file   Lack of Transportation (Non-Medical): Not on file  Physical Activity:    Days of Exercise per Week: Not on file   Minutes of Exercise per Session: Not on file  Stress:    Feeling of Stress : Not on file  Social Connections:    Frequency of Communication with Friends and Family: Not on file   Frequency of Social Gatherings with Friends and Family: Not on file   Attends Religious Services: Not on file   Active Member of Clubs or Organizations: Not on file   Attends Archivist Meetings: Not on file   Marital Status: Not on file  Intimate Partner Violence:    Fear of Current or Ex-Partner: Not on file   Emotionally Abused: Not on file   Physically Abused: Not on file   Sexually Abused: Not on file   Family History  Problem Relation Age of Onset   Hypertension Mother    Diabetes Mother        Boarderline   Cancer Paternal Aunt        brain   Cancer Father        rectal   Sickle cell anemia Sister    Healthy Brother    Healthy Brother    Other Neg Hx    Past Surgical History:  Procedure Laterality Date   NO PAST SURGERIES       Vanessa Kick, MD 10/13/19 1311

## 2020-06-29 ENCOUNTER — Inpatient Hospital Stay (HOSPITAL_COMMUNITY)
Admission: AD | Admit: 2020-06-29 | Discharge: 2020-06-29 | Disposition: A | Discharge status: Home / Self Care | Payer: Self-pay | Attending: Obstetrics & Gynecology | Admitting: Obstetrics & Gynecology

## 2020-06-29 ENCOUNTER — Encounter (HOSPITAL_COMMUNITY): Payer: Self-pay | Admitting: Obstetrics & Gynecology

## 2020-06-29 ENCOUNTER — Inpatient Hospital Stay (HOSPITAL_COMMUNITY): Payer: Self-pay

## 2020-06-29 ENCOUNTER — Other Ambulatory Visit: Payer: Self-pay

## 2020-06-29 DIAGNOSIS — Z3A01 Less than 8 weeks gestation of pregnancy: Secondary | ICD-10-CM | POA: Insufficient documentation

## 2020-06-29 DIAGNOSIS — O209 Hemorrhage in early pregnancy, unspecified: Secondary | ICD-10-CM | POA: Insufficient documentation

## 2020-06-29 DIAGNOSIS — O3481 Maternal care for other abnormalities of pelvic organs, first trimester: Secondary | ICD-10-CM | POA: Insufficient documentation

## 2020-06-29 DIAGNOSIS — O3680X0 Pregnancy with inconclusive fetal viability, not applicable or unspecified: Secondary | ICD-10-CM | POA: Insufficient documentation

## 2020-06-29 DIAGNOSIS — O99331 Smoking (tobacco) complicating pregnancy, first trimester: Secondary | ICD-10-CM | POA: Insufficient documentation

## 2020-06-29 DIAGNOSIS — F17210 Nicotine dependence, cigarettes, uncomplicated: Secondary | ICD-10-CM | POA: Insufficient documentation

## 2020-06-29 DIAGNOSIS — N939 Abnormal uterine and vaginal bleeding, unspecified: Secondary | ICD-10-CM

## 2020-06-29 LAB — WET PREP, GENITAL
Clue Cells Wet Prep HPF POC: NONE SEEN
Sperm: NONE SEEN
Trich, Wet Prep: NONE SEEN
Yeast Wet Prep HPF POC: NONE SEEN

## 2020-06-29 LAB — HCG, QUANTITATIVE, PREGNANCY: hCG, Beta Chain, Quant, S: 1900 m[IU]/mL — ABNORMAL HIGH (ref <5)

## 2020-06-29 LAB — CBC
HCT: 29.7 % — ABNORMAL LOW (ref 36.0–46.0)
Hemoglobin: 9.8 g/dL — ABNORMAL LOW (ref 12.0–15.0)
MCH: 26.6 pg (ref 26.0–34.0)
MCHC: 33 g/dL (ref 30.0–36.0)
MCV: 80.7 fL (ref 80.0–100.0)
Platelets: 225 10*3/uL (ref 150–400)
RBC: 3.68 MIL/uL — ABNORMAL LOW (ref 3.87–5.11)
RDW: 12.9 % (ref 11.5–15.5)
WBC: 6.3 10*3/uL (ref 4.0–10.5)
nRBC: 0 % (ref 0.0–0.2)

## 2020-06-29 LAB — URINALYSIS, ROUTINE W REFLEX MICROSCOPIC
Bilirubin Urine: NEGATIVE
Glucose, UA: NEGATIVE mg/dL
Ketones, ur: 5 mg/dL — AB
Leukocytes,Ua: NEGATIVE
Nitrite: NEGATIVE
Protein, ur: 30 mg/dL — AB
Specific Gravity, Urine: 1.025 (ref 1.005–1.030)
pH: 5 (ref 5.0–8.0)

## 2020-06-29 LAB — I-STAT BETA HCG BLOOD, ED (MC, WL, AP ONLY): I-stat hCG, quantitative: >2000 m[IU]/mL — ABNORMAL HIGH (ref <5)

## 2020-06-29 LAB — ABO/RH: ABO/RH(D): O POS

## 2020-06-29 MED ORDER — CYCLOBENZAPRINE HCL 10 MG PO TABS: ORAL_TABLET | ORAL | 0 refills | Status: DC

## 2020-06-29 NOTE — MAU Note (Signed)
Presents with c/o lower abdominal pain, states pain feels like intermittent pressure.  Reports has had VB, started as spotting, but heavier this morning.  Unsure if passing blood clots.  LMP 05/27/2020.

## 2020-06-29 NOTE — ED Triage Notes (Signed)
Pt presents with pelvic pain, LMC was 9/5, woke this am with severe suprapubic cramping and heavy bleeding. Pt reports she is using a tampon and maxi pad.

## 2020-06-29 NOTE — Discharge Instructions (Signed)

## 2020-06-29 NOTE — ED Triage Notes (Signed)
Emergency Medicine Provider OB Triage Evaluation Note  Melanie Strong is a 34 y.o. female, G0P0, at Unknown gestation who presents to the emergency department with complaints of vaginal bleeding left lower quadrant abdominal pain that began this morning.  She reports that her LMP was 05/08/2019.  She did not have a menstrual cycle for October.  She states she woke up this morning with vaginal bleeding as well as some suprapubic and left lower quadrant abdominal pain.  No recent fevers, cough.  She has never been pregnant before.  Review of  Systems  Positive: Vaginal bleeding, abdominal pain Negative: Fevers, cough.  Physical Exam  BP 127/66   Pulse 95   Temp 99 F (37.2 C) (Oral)   Resp 18   Ht 5\' 6"  (1.676 m)   Wt 54.4 kg   SpO2 100%   BMI 19.37 kg/m  General: Awake, no distress  HEENT: Atraumatic  Resp: Normal effort  Cardiac: Normal rate Abd: Nondistended, mild tenderness in left lower quadrant suprapubic region.  No rigidity, guarding. MSK: Moves all extremities without difficulty Neuro: Speech clear  Medical Decision Making  Pt evaluated for pregnancy concern and is stable for transfer to MAU. Pt is in agreement with plan for transfer.  I-STAT beta is positive.  10:46 AM Discussed with MAU APP, , who accepts patient in transfer.  Clinical Impression   1. Vaginal bleeding     Portions of this note were generated with Dragon dictation software. Dictation errors may occur despite best attempts at proofreading.     Victorino Dike, PA-C 06/29/20 1046

## 2020-06-29 NOTE — MAU Provider Note (Signed)
Chief Complaint:  Abdominal Pain   First Provider Initiated Contact with Patient 06/29/20 1433     HPI: Melanie Strong is a 34 y.o. G1P0 at [redacted]w[redacted]d who presents to maternity admissions reporting vaginal bleeding, cramping and pelvic pressure that began last night. Her LMP is 05/27/20 and she had a +UPT in the ED today.  Past Medical History:  Diagnosis Date  . Glucose found in urine on examination   . Infection    UTI  . No pertinent past medical history    OB History  Gravida Para Term Preterm AB Living  1            SAB TAB Ectopic Multiple Live Births               # Outcome Date GA Lbr Len/2nd Weight Sex Delivery Anes PTL Lv  1 Current            Past Surgical History:  Procedure Laterality Date  . NO PAST SURGERIES     Family History  Problem Relation Age of Onset  . Hypertension Mother   . Diabetes Mother        Boarderline  . Cancer Paternal Aunt        brain  . Cancer Father        rectal  . Sickle cell anemia Sister   . Healthy Brother   . Healthy Brother   . Other Neg Hx    Social History   Tobacco Use  . Smoking status: Current Every Day Smoker    Packs/day: 0.50    Years: 10.00    Pack years: 5.00    Types: Cigarettes  . Smokeless tobacco: Never Used  Vaping Use  . Vaping Use: Never used  Substance Use Topics  . Alcohol use: No    Comment: rare  . Drug use: No   No Known Allergies Medications Prior to Admission  Medication Sig Dispense Refill Last Dose  . Ibuprofen 200 MG CAPS Take 200 mg by mouth daily as needed.     . predniSONE (STERAPRED UNI-PAK 21 TAB) 10 MG (21) TBPK tablet Take by mouth daily. Take as directed. 21 tablet 0   . traMADol (ULTRAM) 50 MG tablet Take 1 tablet (50 mg total) by mouth every 6 (six) hours as needed. 15 tablet 0   . [DISCONTINUED] cyclobenzaprine (FLEXERIL) 10 MG tablet Take 1 tablet by mouth 3 times daily as needed for muscle spasm. Warning: May cause drowsiness. 21 tablet 0     I have reviewed patient's Past  Medical Hx, Surgical Hx, Family Hx, Social Hx, medications and allergies.   ROS:  Review of Systems  HENT: Negative for congestion and sore throat.   Gastrointestinal: Negative for nausea.  Genitourinary: Positive for pelvic pain and vaginal bleeding.  Neurological: Negative for dizziness, light-headedness and headaches.  All other systems reviewed and are negative.   Physical Exam   Patient Vitals for the past 24 hrs:  BP Temp Temp src Pulse Resp SpO2 Height Weight  06/29/20 1251 123/62 98.5 F (36.9 C) Oral 81 18 100 % -- --  06/29/20 1246 -- -- -- -- -- -- 5\' 6"  (1.676 m) 126 lb 4.8 oz (57.3 kg)  06/29/20 0942 127/66 99 F (37.2 C) Oral 95 18 100 % 5\' 6"  (1.676 m) 120 lb (54.4 kg)    Constitutional: Well-developed, well-nourished female in no acute distress.  Cardiovascular: normal rate & rhythm, no murmur Respiratory: normal effort, lung sounds clear throughout  GI: Abd soft, non-tender, gravid appropriate for gestational age. Pos BS x 4 MS: Extremities nontender, no edema, normal ROM Neurologic: Alert and oriented x 4.  Pelvic: NEFG, copious blood, clot suspicious for POC removed and sent to pathology   Labs: Results for orders placed or performed during the hospital encounter of 06/29/20 (from the past 24 hour(s))  I-Stat Beta hCG blood, ED ([facility], [facility], [facility] only)     Status: Abnormal   Collection Time: 06/29/20 10:31 AM  Result Value Ref Range   I-stat hCG, quantitative >2,000.0 (H) <5 mIU/mL   Comment 3          Urinalysis, Routine w reflex microscopic Urine, Clean Catch     Status: Abnormal   Collection Time: 06/29/20  1:05 PM  Result Value Ref Range   Color, Urine YELLOW YELLOW   APPearance HAZY (A) CLEAR   Specific Gravity, Urine 1.025 1.005 - 1.030   pH 5.0 5.0 - 8.0   Glucose, UA NEGATIVE NEGATIVE mg/dL   Hgb urine dipstick LARGE (A) NEGATIVE   Bilirubin Urine NEGATIVE NEGATIVE   Ketones, ur 5 (A) NEGATIVE mg/dL   Protein, ur 30 (A) NEGATIVE mg/dL   Nitrite  NEGATIVE NEGATIVE   Leukocytes,Ua NEGATIVE NEGATIVE   RBC / HPF 21-50 0 - 5 RBC/hpf   WBC, UA 6-10 0 - 5 WBC/hpf   Bacteria, UA RARE (A) NONE SEEN   Squamous Epithelial / LPF 0-5 0 - 5   Mucus PRESENT   CBC     Status: Abnormal   Collection Time: 06/29/20  2:54 PM  Result Value Ref Range   WBC 6.3 4.0 - 10.5 K/uL   RBC 3.68 (L) 3.87 - 5.11 MIL/uL   Hemoglobin 9.8 (L) 12.0 - 15.0 g/dL   HCT 98.9 (L) 36 - 46 %   MCV 80.7 80.0 - 100.0 fL   MCH 26.6 26.0 - 34.0 pg   MCHC 33.0 30.0 - 36.0 g/dL   RDW 21.1 94.1 - 74.0 %   Platelets 225 150 - 400 K/uL   nRBC 0.0 0.0 - 0.2 %  ABO/Rh     Status: None   Collection Time: 06/29/20  2:54 PM  Result Value Ref Range   ABO/RH(D) O POS    No rh immune globuloin      NOT A RH IMMUNE GLOBULIN CANDIDATE, PT RH POSITIVE Performed at [facility], [address] [place], [place] [address]   hCG, quantitative, pregnancy     Status: Abnormal   Collection Time: 06/29/20  2:54 PM  Result Value Ref Range   hCG, Beta Chain, Quant, S 1,900 (H) <5 mIU/mL  Wet prep, genital     Status: Abnormal   Collection Time: 06/29/20  3:02 PM   Specimen: Cervix  Result Value Ref Range   Yeast Wet Prep HPF POC NONE SEEN NONE SEEN   Trich, Wet Prep NONE SEEN NONE SEEN   Clue Cells Wet Prep HPF POC NONE SEEN NONE SEEN   WBC, Wet Prep HPF POC MANY (A) NONE SEEN   Sperm NONE SEEN     Imaging:  US OB LESS THAN 14 WEEKS WITH OB TRANSVAGINAL  Result Date: 06/29/2020 CLINICAL DATA:  Vaginal bleeding, quantitative bHCG 1,900 @ 14:54 EXAM: OBSTETRIC <14 WK [place] AND TRANSVAGINAL OB US TECHNIQUE: Both transabdominal and transvaginal ultrasound examinations were performed for complete evaluation of the gestation as well as the maternal uterus, adnexal regions, and pelvic cul-de-sac. Transvaginal technique was performed to assess early  pregnancy. COMPARISON:  None. FINDINGS: Intrauterine gestational sac: None Yolk sac:  Not Visualized. Embryo:  Not Visualized. Cardiac  Activity: Not Visualized. Maternal uterus/adnexae: Anteverted maternal uterus. No clear gestational sac or endometrial fluid collection is identified. No other uterine abnormality is seen. No concerning adnexal lesions collections. A thick-walled cyst in the right ovary, likely corpus luteum. No pelvic free fluid. IMPRESSION: No intrauterine pregnancy visualized. Differential considerations would include early intrauterine pregnancy too early to visualize, spontaneous abortion, or occult ectopic pregnancy. Recommend close clinical followup and serial quantitative beta HCGs and ultrasounds. Electronically Signed   By: Kreg Shropshire M.D.   On: 06/29/2020 16:25    MAU Course: Orders Placed This Encounter  Procedures  . Wet prep, genital  . US OB LESS THAN 14 WEEKS WITH OB TRANSVAGINAL  . Urinalysis, Routine w reflex microscopic Urine, Clean Catch  . CBC  . hCG, quantitative, pregnancy  . I-Stat Beta hCG blood, ED ([facility], [facility], [facility] only)  . ABO/Rh  . Discharge patient   Meds ordered this encounter  Medications  . cyclobenzaprine (FLEXERIL) 10 MG tablet    Sig: Take 1 tablet by mouth 3 times daily as needed for muscle spasm. Warning: May cause drowsiness.    Dispense:  21 tablet    Refill:  0    Order Specific Question:   Supervising Provider    Answer:   Samara Snide    MDM: Ectopic/vaginal bleeding in early pregnancy workup done - no IUP seen but HCG=1900. Explained findings to pt and emphasized importance of follow up for repeat blood work. Follow up in MAU after 3pm on Saturday. Assessment: 1. Vaginal bleeding   2. Vaginal bleeding affecting early pregnancy   3. Pregnancy of unknown anatomic location     Plan: Discharge home in stable condition with ectopic precautions.  Flexeril sent to pharmacy Work note printed for pt   Follow-up Information    [facility]. Go in 2 day(s).   Specialty: Obstetrics and Gynecology Why: Come to MAU after 3pm on Saturday,  Oct 30 for repeat bloodwork. Contact information: [address] [id number] mc [place] [address] [phone]              Allergies as of 06/29/2020   No Known Allergies     Medication List    STOP taking these medications   Ibuprofen 200 MG Caps   predniSONE 10 MG (21) Tbpk tablet Commonly known as: STERAPRED UNI-PAK 21 TAB   traMADol 50 MG tablet Commonly known as: ULTRAM     TAKE these medications   cyclobenzaprine 10 MG tablet Commonly known as: FLEXERIL Take 1 tablet by mouth 3 times daily as needed for muscle spasm. Warning: May cause drowsiness.      Edd Arbour, CNM, MSN, [facility] 06/29/20 5:12 PM

## 2020-06-29 NOTE — ED Notes (Signed)
Pt refused transport to MAU, stated, "I'll drive myself, I know where it is"

## 2020-06-30 LAB — GC/CHLAMYDIA PROBE AMP (~~LOC~~) NOT AT ARMC
Chlamydia: NEGATIVE
Comment: NEGATIVE
Comment: NORMAL
Neisseria Gonorrhea: NEGATIVE

## 2020-07-01 ENCOUNTER — Other Ambulatory Visit: Payer: Self-pay

## 2020-07-01 ENCOUNTER — Inpatient Hospital Stay (HOSPITAL_COMMUNITY)
Admission: AD | Admit: 2020-07-01 | Discharge: 2020-07-01 | Disposition: A | Discharge status: Home / Self Care | Payer: Self-pay | Attending: Obstetrics & Gynecology | Admitting: Obstetrics & Gynecology

## 2020-07-01 DIAGNOSIS — Z3A01 Less than 8 weeks gestation of pregnancy: Secondary | ICD-10-CM | POA: Insufficient documentation

## 2020-07-01 DIAGNOSIS — O039 Complete or unspecified spontaneous abortion without complication: Secondary | ICD-10-CM | POA: Insufficient documentation

## 2020-07-01 LAB — HCG, QUANTITATIVE, PREGNANCY: hCG, Beta Chain, Quant, S: 297 m[IU]/mL — ABNORMAL HIGH (ref <5)

## 2020-07-01 NOTE — MAU Note (Signed)
Melanie Strong is a 34 y.o. at [redacted]w[redacted]d here in MAU reporting: here for follow up hcg. States she has been having some pain and bleeding and was the worst around 0200 today. Is wearing a pad and has not had to change it.  Onset of complaint: ongoing  Pain score: 2/10  Vitals:   07/01/20 1559  BP: 125/82  Pulse: 95  Resp: 16  Temp: 98.4 F (36.9 C)  SpO2: 100%     Lab orders placed from triage: hcg

## 2020-07-01 NOTE — MAU Provider Note (Signed)
History   Chief Complaint:  Follow-up   Melanie Strong is  34 y.o. G1P0 Patient's last menstrual period was 05/07/2020.Marland Kitchen Patient is here for follow up of quantitative HCG and ongoing surveillance of pregnancy status.   She is [redacted]w[redacted]d weeks gestation  by LMP.    Since her last visit, the patient is without new complaint.   The patient reports bleeding as  Decreased from last visit but still present - red with occasional small clot.   General ROS:  Review of Systems  Constitutional: Negative.   HENT: Negative.   Respiratory: Negative.   Cardiovascular: Negative.   Gastrointestinal: Negative.   Genitourinary: Negative.   Neurological: Negative.       Her previous Quantitative HCG values are: 06-29-20  1,900    Physical Exam   Blood pressure 125/82, pulse 95, temperature 98.4 F (36.9 C), temperature source Oral, resp. rate 16, last menstrual period 05/07/2020, SpO2 100 %.  GENERAL: Well-developed, well-nourished female in no acute distress.  HEENT: Normocephalic, atraumatic.  LUNGS: Effort normal  HEART: Regular rate  SKIN: Warm, dry and without erythema  PSYCH: Normal mood and affect  Labs: Results for orders placed or performed during the hospital encounter of 07/01/20 (from the past 24 hour(s))  hCG, quantitative, pregnancy   Collection Time: 07/01/20  3:39 PM  Result Value Ref Range   hCG, Beta Chain, Quant, S 297 (H) <5 mIU/mL    Ultrasound Studies:   US OB LESS THAN 14 WEEKS WITH OB TRANSVAGINAL  Result Date: 06/29/2020 CLINICAL DATA:  Vaginal bleeding, quantitative bHCG 1,900 @ 14:54 EXAM: OBSTETRIC <14 WK [place] AND TRANSVAGINAL OB US TECHNIQUE: Both transabdominal and transvaginal ultrasound examinations were performed for complete evaluation of the gestation as well as the maternal uterus, adnexal regions, and pelvic cul-de-sac. Transvaginal technique was performed to assess early pregnancy. COMPARISON:  None. FINDINGS: Intrauterine gestational sac: None Yolk sac:   Not Visualized. Embryo:  Not Visualized. Cardiac Activity: Not Visualized. Maternal uterus/adnexae: Anteverted maternal uterus. No clear gestational sac or endometrial fluid collection is identified. No other uterine abnormality is seen. No concerning adnexal lesions collections. A thick-walled cyst in the right ovary, likely corpus luteum. No pelvic free fluid. IMPRESSION: No intrauterine pregnancy visualized. Differential considerations would include early intrauterine pregnancy too early to visualize, spontaneous abortion, or occult ectopic pregnancy. Recommend close clinical followup and serial quantitative beta HCGs and ultrasounds. Electronically Signed   By: Kreg Shropshire M.D.   On: 06/29/2020 16:25    Assessment: Miscarriage - significant decrease in quant and a history of vaginal bleeding for the past 2 days. Client and her partner present - reassurance given as client is tearful and this was a wanted pregnancy.  Plan: The patient is instructed to follow up at appointment on 07-11-20 at [facility] at 10:30 am for labs and then appointment will need to be made in another week after her labs to see a provider.  Message sent to admin staff. Advised client that vaginal bleeding for up to 2-3 weeks is normal Advised pelvic rest and nothing in the vagina.  Melanie Strong 07/01/2020, 9:37 PM

## 2020-07-03 LAB — SURGICAL PATHOLOGY

## 2020-07-11 ENCOUNTER — Other Ambulatory Visit: Payer: Self-pay

## 2020-07-11 ENCOUNTER — Ambulatory Visit: Payer: Self-pay | Admitting: *Deleted

## 2020-07-11 DIAGNOSIS — O039 Complete or unspecified spontaneous abortion without complication: Secondary | ICD-10-CM

## 2020-07-12 LAB — BETA HCG QUANT (REF LAB): hCG Quant: 5 m[IU]/mL

## 2020-07-20 ENCOUNTER — Other Ambulatory Visit: Payer: Self-pay

## 2020-07-20 ENCOUNTER — Encounter: Payer: Self-pay | Admitting: Obstetrics & Gynecology

## 2020-07-20 ENCOUNTER — Ambulatory Visit (INDEPENDENT_AMBULATORY_CARE_PROVIDER_SITE_OTHER): Payer: Self-pay | Admitting: Obstetrics & Gynecology

## 2020-07-20 VITALS — BP 125/78 | HR 90 | Wt 123.5 lb

## 2020-07-20 DIAGNOSIS — O039 Complete or unspecified spontaneous abortion without complication: Secondary | ICD-10-CM

## 2020-07-20 NOTE — Progress Notes (Signed)
Here for follow up after Sab . States stopped bleeding early November. States was having pain in abdomen=5 last 2 days, none at present.  Amarionna Arca,RN

## 2020-07-20 NOTE — Progress Notes (Signed)
LETTY Strong is an 34 y.o. female. G1P0010 for follow up after SAB. LMP prior was in Sept. Bleeding related to SAB stopped early November.  States was having pain in abdomen last 2 days, none at present. She has not had menses since SAB.   Menstrual History: Menarche age: 21 Patient's last menstrual period was 05/07/2020.    Past Medical History:  Diagnosis Date  . Glucose found in urine on examination   . Infection    UTI  . No pertinent past medical history     Past Surgical History:  Procedure Laterality Date  . NO PAST SURGERIES      Family History  Problem Relation Age of Onset  . Hypertension Mother   . Diabetes Mother        Boarderline  . Cancer Paternal Aunt        brain  . Cancer Father        rectal  . Sickle cell anemia Sister   . Healthy Brother   . Healthy Brother   . Other Neg Hx     Social History:  reports that she has been smoking cigarettes. She has a 5.00 pack-year smoking history. She has never used smokeless tobacco. She reports that she does not drink alcohol and does not use drugs.  Allergies: No Known Allergies  (Not in a hospital admission)   Review of Systems  Constitutional: Negative for activity change, appetite change, fatigue and unexpected weight change.  HENT: Negative.   Eyes: Negative.   Respiratory: Negative.  Negative for chest tightness, shortness of breath and wheezing.   Cardiovascular: Negative.  Negative for chest pain and leg swelling.  Gastrointestinal: Positive for abdominal pain. Negative for abdominal distention, constipation, diarrhea, nausea and vomiting.  Endocrine: Negative.   Genitourinary: Positive for vaginal bleeding. Negative for dysuria and hematuria.  Neurological: Negative.  Negative for dizziness, weakness, light-headedness, numbness and headaches.  Hematological: Negative.   Psychiatric/Behavioral: Negative.  Negative for agitation, confusion and decreased concentration.    Blood pressure 125/78,  pulse 90, weight 123 lb 8 oz (56 kg), last menstrual period 05/07/2020. Physical Exam Vitals reviewed.  Constitutional:      Appearance: She is well-developed.  HENT:     Head: Normocephalic and atraumatic.  Eyes:     Pupils: Pupils are equal, round, and reactive to light.  Cardiovascular:     Rate and Rhythm: Normal rate.  Pulmonary:     Effort: Pulmonary effort is normal.  Abdominal:     General: There is no distension.     Palpations: Abdomen is soft.     Tenderness: There is no abdominal tenderness. There is no guarding.  Musculoskeletal:     Cervical back: Normal range of motion.  Skin:    General: Skin is warm and dry.  Neurological:     Mental Status: She is alert and oriented to person, place, and time.  Psychiatric:        Behavior: Behavior normal.     No results found for this or any previous visit (from the past 24 hour(s)).  No results found.  Assessment/Plan: SAB: discussed plans for conceptions vs contraception. Pt desires to conceive. Discuss waiting 2-3 cycles before trying again. Focused preconception counseling discussed.   F/u annual/ PRN concerns.   Malachy Chamber 07/20/2020, 5:21 PM

## 2020-12-20 ENCOUNTER — Other Ambulatory Visit: Payer: Self-pay

## 2020-12-20 ENCOUNTER — Ambulatory Visit (INDEPENDENT_AMBULATORY_CARE_PROVIDER_SITE_OTHER): Payer: Self-pay

## 2020-12-20 DIAGNOSIS — Z3201 Encounter for pregnancy test, result positive: Secondary | ICD-10-CM

## 2020-12-20 LAB — POCT PREGNANCY, URINE: Preg Test, Ur: POSITIVE — AB

## 2020-12-20 NOTE — Progress Notes (Signed)
Patient seen and assessed by nursing staff.  Agree with documentation and plan.  

## 2020-12-20 NOTE — Progress Notes (Signed)
Pt visit for walk in pregnancy test resulting positive.  Pt denies vaginal bleeding and some mild menstrual cramping all over the lower abdomen.  Pt advised that if her cramping becomes more intense, becomes unilateral, or she starts bleeding like a period to please go to MAU.  Pt stated "okay, I will do that."  Medications/allergies reviewed.  Pt reports LMP 11/21/20 which makes her 4w 1d today, and EDD 08/28/21.  Pt advised to start taking a PNV and to start OB care.  Pt stated that she has spoken to [facility] yesterday and so will give them a return call back.  Pt verbalized understanding to all that was discussed with no further questions.   Addison Naegeli, RN  12/20/20

## 2020-12-27 ENCOUNTER — Encounter (HOSPITAL_COMMUNITY): Payer: Self-pay | Admitting: Obstetrics and Gynecology

## 2020-12-27 ENCOUNTER — Inpatient Hospital Stay (HOSPITAL_COMMUNITY): Payer: BC Managed Care – PPO

## 2020-12-27 ENCOUNTER — Inpatient Hospital Stay (HOSPITAL_COMMUNITY)
Admission: AD | Admit: 2020-12-27 | Discharge: 2020-12-27 | Disposition: A | Discharge status: Home / Self Care | Payer: BC Managed Care – PPO | Attending: Obstetrics and Gynecology | Admitting: Obstetrics and Gynecology

## 2020-12-27 ENCOUNTER — Other Ambulatory Visit: Payer: Self-pay

## 2020-12-27 DIAGNOSIS — R42 Dizziness and giddiness: Secondary | ICD-10-CM | POA: Insufficient documentation

## 2020-12-27 DIAGNOSIS — O219 Vomiting of pregnancy, unspecified: Secondary | ICD-10-CM

## 2020-12-27 DIAGNOSIS — Z3A01 Less than 8 weeks gestation of pregnancy: Secondary | ICD-10-CM

## 2020-12-27 DIAGNOSIS — O99891 Other specified diseases and conditions complicating pregnancy: Secondary | ICD-10-CM

## 2020-12-27 DIAGNOSIS — R55 Syncope and collapse: Secondary | ICD-10-CM

## 2020-12-27 DIAGNOSIS — O26891 Other specified pregnancy related conditions, first trimester: Secondary | ICD-10-CM | POA: Diagnosis not present

## 2020-12-27 DIAGNOSIS — O99331 Smoking (tobacco) complicating pregnancy, first trimester: Secondary | ICD-10-CM | POA: Insufficient documentation

## 2020-12-27 DIAGNOSIS — Z3491 Encounter for supervision of normal pregnancy, unspecified, first trimester: Secondary | ICD-10-CM

## 2020-12-27 DIAGNOSIS — R109 Unspecified abdominal pain: Secondary | ICD-10-CM

## 2020-12-27 DIAGNOSIS — F17210 Nicotine dependence, cigarettes, uncomplicated: Secondary | ICD-10-CM | POA: Diagnosis not present

## 2020-12-27 LAB — CBC
HCT: 32.6 % — ABNORMAL LOW (ref 36.0–46.0)
Hemoglobin: 10.4 g/dL — ABNORMAL LOW (ref 12.0–15.0)
MCH: 26.8 pg (ref 26.0–34.0)
MCHC: 31.9 g/dL (ref 30.0–36.0)
MCV: 84 fL (ref 80.0–100.0)
Platelets: 234 10*3/uL (ref 150–400)
RBC: 3.88 MIL/uL (ref 3.87–5.11)
RDW: 13.7 % (ref 11.5–15.5)
WBC: 5.2 10*3/uL (ref 4.0–10.5)
nRBC: 0 % (ref 0.0–0.2)

## 2020-12-27 LAB — URINALYSIS, ROUTINE W REFLEX MICROSCOPIC
Bilirubin Urine: NEGATIVE
Glucose, UA: NEGATIVE mg/dL
Hgb urine dipstick: NEGATIVE
Ketones, ur: NEGATIVE mg/dL
Leukocytes,Ua: NEGATIVE
Nitrite: NEGATIVE
Protein, ur: NEGATIVE mg/dL
Specific Gravity, Urine: 1.005 (ref 1.005–1.030)
pH: 7 (ref 5.0–8.0)

## 2020-12-27 LAB — HIV ANTIBODY (ROUTINE TESTING W REFLEX): HIV Screen 4th Generation wRfx: NONREACTIVE

## 2020-12-27 LAB — HCG, QUANTITATIVE, PREGNANCY: hCG, Beta Chain, Quant, S: 5429 m[IU]/mL — ABNORMAL HIGH (ref <5)

## 2020-12-27 LAB — ABO/RH: ABO/RH(D): O POS

## 2020-12-27 MED ORDER — METOCLOPRAMIDE HCL 10 MG PO TABS: 10.0000 mg | ORAL_TABLET | Freq: Three times a day (TID) | ORAL | 3 refills | Status: DC | PRN

## 2020-12-27 MED ORDER — PROMETHAZINE HCL 12.5 MG PO TABS: 12.5000 mg | ORAL_TABLET | Freq: Every evening | ORAL | 3 refills | Status: DC | PRN

## 2020-12-27 NOTE — ED Provider Notes (Signed)
Emergency Medicine Provider OB Triage Evaluation Note  Melanie Strong is a 35 y.o. female, currently [redacted] weeks pregnant who presents to the emergency department with complaints of lightheadedness and nausea for the past 2 days. Pt reports when she stands up she feels like she could pass out. She denies any headache, blurry vision, double vision, confusion, unilateral weakness or numbness, abdominal pain, vomiting, vaginal discharge, urinary symptoms, or any other associated symptoms.  Review of  Systems  Positive: + lightheadedness, + nausea Negative: - abdominal pain, vaginal bleeding  Physical Exam  BP (!) 145/76 (BP Location: Right Arm)   Pulse 87   Temp 98.9 F (37.2 C) (Oral)   Resp 16   LMP 05/07/2020   SpO2 100%  General: Awake, no distress  HEENT: Atraumatic  Resp: Normal effort  Cardiac: Normal rate Abd: Nondistended, nontender  MSK: Moves all extremities without difficulty Neuro: Speech clear  Medical Decision Making  Pt evaluated for pregnancy concern and is stable for transfer to MAU. Pt is in agreement with plan for transfer.  10:29 AM Discussed with MAU APP, Shanda Bumps, who accepts patient in transfer.  Clinical Impression  No diagnosis found.     Tanda Rockers, PA-C 12/27/20 1031    Gerhard Munch, MD 12/28/20 1026

## 2020-12-27 NOTE — MAU Provider Note (Signed)
Chief Complaint: Near syncope   Event Date/Time   First Provider Initiated Contact with Patient 12/27/20 1224      SUBJECTIVE HPI: Melanie Strong is a 35 y.o. G2P0010 at [redacted]w[redacted]d by LMP with hx significant for anemia and SAB who presents to maternity admissions reporting near syncopal episodes, nausea, lack of appetite and some mild abdominal cramping x 2 weeks. She reports symptoms started before she found out she was pregnant. She had a miscarriage 06/2020 and is anxious about this pregnancy. Her pain is mild, lower abdominal cramping. She denies vaginal bleeding.   Location: lower abdomen Quality: cramp;ing Severity: 1/10 on pain scale Duration: 2 weeks Timing: intermittent Modifying factors: none Associated signs and symptoms: nausea, lack of appetite, near syncope  HPI  Past Medical History:  Diagnosis Date  . Glucose found in urine on examination   . Infection    UTI  . No pertinent past medical history    Past Surgical History:  Procedure Laterality Date  . NO PAST SURGERIES     Social History   Socioeconomic History  . Marital status: Married    Spouse name: Not on file  . Number of children: Not on file  . Years of education: Not on file  . Highest education level: Not on file  Occupational History  . Not on file  Tobacco Use  . Smoking status: Current Every Day Smoker    Packs/day: 0.50    Years: 10.00    Pack years: 5.00    Types: Cigarettes  . Smokeless tobacco: Never Used  Vaping Use  . Vaping Use: Never used  Substance and Sexual Activity  . Alcohol use: No    Comment: rare  . Drug use: No  . Sexual activity: Yes    Birth control/protection: None  Other Topics Concern  . Not on file  Social History Narrative  . Not on file   Social Determinants of Health   Financial Resource Strain: Not on file  Food Insecurity: No Food Insecurity  . Worried About Programme researcher, broadcasting/film/video in the Last Year: Never true  . Ran Out of Food in the Last Year: Never  true  Transportation Needs: No Transportation Needs  . Lack of Transportation (Medical): No  . Lack of Transportation (Non-Medical): No  Physical Activity: Not on file  Stress: Not on file  Social Connections: Not on file  Intimate Partner Violence: Not on file   No current facility-administered medications on file prior to encounter.   Current Outpatient Medications on File Prior to Encounter  Medication Sig Dispense Refill  . Prenatal Vit-Fe Fumarate-FA (PRENATAL MULTIVITAMIN) TABS tablet Take 1 tablet by mouth daily at 12 noon.     No Known Allergies  ROS:  Review of Systems  Constitutional: Negative for chills, fatigue and fever.  Respiratory: Negative for shortness of breath.   Cardiovascular: Negative for chest pain.  Gastrointestinal: Positive for abdominal pain and nausea. Negative for vomiting.  Genitourinary: Negative for difficulty urinating, dysuria, flank pain, pelvic pain, vaginal bleeding, vaginal discharge and vaginal pain.  Neurological: Positive for dizziness. Negative for headaches.  Psychiatric/Behavioral: Negative.      I have reviewed patient's Past Medical Hx, Surgical Hx, Family Hx, Social Hx, medications and allergies.   Physical Exam   Patient Vitals for the past 24 hrs:  BP Temp Temp src Pulse Resp SpO2 Height Weight  12/27/20 1142 119/67 -- -- 70 -- -- -- --  12/27/20 1116 -- -- -- -- -- --  5\' 4"  (1.626 m) 58.3 kg  12/27/20 1112 110/63 98.3 F (36.8 C) Oral 73 20 100 % -- --  12/27/20 1026 (!) 145/76 98.9 F (37.2 C) Oral 87 16 100 % -- --   Constitutional: Well-developed, well-nourished female in no acute distress.  Cardiovascular: normal rate Respiratory: normal effort GI: Abd soft, non-tender. Pos BS x 4 MS: Extremities nontender, no edema, normal ROM Neurologic: Alert and oriented x 4.  GU: Neg CVAT.  PELVIC EXAM: Deferred   LAB RESULTS Results for orders placed or performed during the hospital encounter of 12/27/20 (from the past  24 hour(s))  Urinalysis, Routine w reflex microscopic Urine, Clean Catch     Status: Abnormal   Collection Time: 12/27/20 11:16 AM  Result Value Ref Range   Color, Urine YELLOW YELLOW   APPearance HAZY (A) CLEAR   Specific Gravity, Urine 1.005 1.005 - 1.030   pH 7.0 5.0 - 8.0   Glucose, UA NEGATIVE NEGATIVE mg/dL   Hgb urine dipstick NEGATIVE NEGATIVE   Bilirubin Urine NEGATIVE NEGATIVE   Ketones, ur NEGATIVE NEGATIVE mg/dL   Protein, ur NEGATIVE NEGATIVE mg/dL   Nitrite NEGATIVE NEGATIVE   Leukocytes,Ua NEGATIVE NEGATIVE  CBC     Status: Abnormal   Collection Time: 12/27/20 12:55 PM  Result Value Ref Range   WBC 5.2 4.0 - 10.5 K/uL   RBC 3.88 3.87 - 5.11 MIL/uL   Hemoglobin 10.4 (L) 12.0 - 15.0 g/dL   HCT 12/29/20 (L) 40.9 - 81.1 %   MCV 84.0 80.0 - 100.0 fL   MCH 26.8 26.0 - 34.0 pg   MCHC 31.9 30.0 - 36.0 g/dL   RDW 91.4 78.2 - 95.6 %   Platelets 234 150 - 400 K/uL   nRBC 0.0 0.0 - 0.2 %  hCG, quantitative, pregnancy     Status: Abnormal   Collection Time: 12/27/20 12:55 PM  Result Value Ref Range   hCG, Beta Chain, Quant, S 5,429 (H) <5 mIU/mL  HIV Antibody (routine testing w rflx)     Status: None   Collection Time: 12/27/20 12:55 PM  Result Value Ref Range   HIV Screen 4th Generation wRfx Non Reactive Non Reactive  ABO/Rh     Status: None   Collection Time: 12/27/20 12:55 PM  Result Value Ref Range   ABO/RH(D) O POS    No rh immune globuloin      NOT A RH IMMUNE GLOBULIN CANDIDATE, PT RH POSITIVE Performed at [facility], [address] [place], [place]     --/--/O POS (04/27 1255)  IMAGING 01-08-1973 OB LESS THAN 14 WEEKS WITH OB TRANSVAGINAL  Result Date: 12/27/2020 CLINICAL DATA:  Early pregnancy with pain and cramping. EXAM: OBSTETRIC <14 WK 12/29/2020 AND TRANSVAGINAL OB US TECHNIQUE: Both transabdominal and transvaginal ultrasound examinations were performed for complete evaluation of the gestation as well as the maternal uterus, adnexal regions, and  pelvic cul-de-sac. Transvaginal technique was performed to assess early pregnancy. COMPARISON:  None. FINDINGS: Intrauterine gestational sac: Present Yolk sac:  Present Embryo:  Not identifiable MSD: 4.6 mm   5 w   1 d Subchorionic hemorrhage:  None visualized. Maternal uterus/adnexae: Corpus luteum noted associated with the left ovary. Right ovary appears normal. IMPRESSION: Single intrauterine gestational sac, 5 weeks 1 day by mean sac diameter. Visible yolk sac but no visible fetal pole at this time. Electronically Signed   By: [place] M.D.   On: 12/27/2020 14:40    MAU Management/MDM: Orders  Placed This Encounter  Procedures  . US OB LESS THAN 14 WEEKS WITH OB TRANSVAGINAL  . US OB Transvaginal  . Urinalysis, Routine w reflex microscopic Urine, Clean Catch  . CBC  . hCG, quantitative, pregnancy  . HIV Antibody (routine testing w rflx)  . ABO/Rh  . Discharge patient    Meds ordered this encounter  Medications  . metoCLOPramide (REGLAN) 10 MG tablet    Sig: Take 1 tablet (10 mg total) by mouth 3 (three) times daily with meals as needed for nausea.    Dispense:  30 tablet    Refill:  3    Order Specific Question:   Supervising Provider    Answer:   [name] [id number]  . promethazine (PHENERGAN) 12.5 MG tablet    Sig: Take 1-2 tablets (12.5-25 mg total) by mouth at bedtime as needed for nausea or vomiting.    Dispense:  30 tablet    Refill:  3    Order Specific Question:   Supervising Provider    Answer:   Lyndel Safe NILES [id number]    IUP on today's [place], without fetal pole at [redacted]w[redacted]d.  Pt to f/u with viability [place] in 10 days.  CBC, CMP are wnl, pt with hx anemia but Hgb improved today.  Pt encouraged to eat regularly, choose high protein and high iron foods.  Rx for Reglan and Phenergan to improve nausea/appetite.  Return to MAU as needed for emergencies.   ASSESSMENT 1. Normal IUP (intrauterine pregnancy) on prenatal ultrasound, first trimester   2. Abdominal  pain during pregnancy in first trimester   3. Near syncope   4. Nausea and vomiting during pregnancy prior to [redacted] weeks gestation     PLAN Discharge home Allergies as of 12/27/2020   No Known Allergies     Medication List    TAKE these medications   metoCLOPramide 10 MG tablet Commonly known as: REGLAN Take 1 tablet (10 mg total) by mouth 3 (three) times daily with meals as needed for nausea.   prenatal multivitamin Tabs tablet Take 1 tablet by mouth daily at 12 noon.   promethazine 12.5 MG tablet Commonly known as: PHENERGAN Take 1-2 tablets (12.5-25 mg total) by mouth at bedtime as needed for nausea or vomiting.       Follow-up Information    [facility] Follow up.   Specialty: Radiology Contact information: [address] [place] [address] [phone]              Sharen Counter Certified Nurse-Midwife 12/27/2020  3:31 PM

## 2020-12-27 NOTE — MAU Note (Signed)
Presents with near syncope and decreased energy, but didn't actually pass out. Denies VB, endorses minimal abdominal cramping.  LMP 11/21/2020.

## 2021-01-18 ENCOUNTER — Other Ambulatory Visit: Payer: Self-pay

## 2021-01-18 ENCOUNTER — Ambulatory Visit
Admission: RE | Admit: 2021-01-18 | Discharge: 2021-01-18 | Disposition: A | Discharge status: Home / Self Care | Payer: BC Managed Care – PPO | Source: Ambulatory Visit | Attending: Advanced Practice Midwife | Admitting: Advanced Practice Midwife

## 2021-01-18 DIAGNOSIS — O26891 Other specified pregnancy related conditions, first trimester: Secondary | ICD-10-CM | POA: Diagnosis present

## 2021-01-18 DIAGNOSIS — R109 Unspecified abdominal pain: Secondary | ICD-10-CM | POA: Diagnosis present

## 2021-01-23 ENCOUNTER — Telehealth: Payer: Self-pay | Admitting: Obstetrics and Gynecology

## 2021-01-23 NOTE — Telephone Encounter (Signed)
I called Melanie Strong today at 9:36 AM and confirmed patient's identity using two patient identifiers. [place] results from earlier today were reviewed. Patient is scheduled for new OB visit  on 6/2. First trimester warning signs reviewed. Patient voiced understanding and had no further questions.   No results found.  Duane Lope, NP 01/23/2021 9:36 AM

## 2021-01-25 ENCOUNTER — Ambulatory Visit (INDEPENDENT_AMBULATORY_CARE_PROVIDER_SITE_OTHER): Payer: BC Managed Care – PPO

## 2021-01-25 DIAGNOSIS — Z3481 Encounter for supervision of other normal pregnancy, first trimester: Secondary | ICD-10-CM

## 2021-01-25 DIAGNOSIS — Z3480 Encounter for supervision of other normal pregnancy, unspecified trimester: Secondary | ICD-10-CM

## 2021-01-25 DIAGNOSIS — Z3A09 9 weeks gestation of pregnancy: Secondary | ICD-10-CM

## 2021-01-25 MED ORDER — BLOOD PRESSURE KIT DEVI: 1.0000 | 0 refills | Status: DC | PRN

## 2021-01-25 NOTE — Progress Notes (Signed)
..    Virtual Visit via Telephone Note  I connected with [name] on 01/25/21 at  9:00 AM EDT by telephone and verified that I am speaking with the correct person using two identifiers.  Location: Patient: Melanie Strong Provider: Nurse   I discussed the limitations, risks, security and privacy concerns of performing an evaluation and management service by telephone and the availability of in person appointments. I also discussed with the patient that there may be a patient responsible charge related to this service. The patient expressed understanding and agreed to proceed.   History of Present Illness: PRENATAL INTAKE SUMMARY  Ms. [name] presents today New OB Nurse Interview.  OB History    Gravida  2   Para      Term      Preterm      AB  1   Living        SAB  1   IAB      Ectopic      Multiple      Live Births             I have reviewed the patient's medical, obstetrical, social, and family histories, medications, and available lab results.  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 PHQ-9= 7 Babyscripts sent to e-mail BP cuff sent to [facility]   Follow Up Instructions:   I discussed the assessment and treatment plan with the patient. The patient was provided an opportunity to ask questions and all were answered. The patient agreed with the plan and demonstrated an understanding of the instructions.   The patient was advised to call back or seek an in-person evaluation if the symptoms worsen or if the condition fails to improve as anticipated.  I provided 15 minutes of non-face-to-face time during this  encounter.   Katrina Stack, RN

## 2021-01-31 ENCOUNTER — Encounter: Payer: Self-pay | Admitting: Family Medicine

## 2021-01-31 ENCOUNTER — Encounter: Payer: Self-pay | Admitting: Women's Health

## 2021-02-01 ENCOUNTER — Ambulatory Visit (INDEPENDENT_AMBULATORY_CARE_PROVIDER_SITE_OTHER): Payer: BC Managed Care – PPO | Admitting: Advanced Practice Midwife

## 2021-02-01 ENCOUNTER — Other Ambulatory Visit: Payer: Self-pay

## 2021-02-01 ENCOUNTER — Other Ambulatory Visit (HOSPITAL_COMMUNITY)
Admission: RE | Admit: 2021-02-01 | Discharge: 2021-02-01 | Disposition: A | Discharge status: Home / Self Care | Payer: BC Managed Care – PPO | Source: Ambulatory Visit | Attending: Women's Health | Admitting: Women's Health

## 2021-02-01 VITALS — BP 104/63 | HR 81 | Wt 132.8 lb

## 2021-02-01 DIAGNOSIS — Z3480 Encounter for supervision of other normal pregnancy, unspecified trimester: Secondary | ICD-10-CM | POA: Insufficient documentation

## 2021-02-01 DIAGNOSIS — O99011 Anemia complicating pregnancy, first trimester: Secondary | ICD-10-CM

## 2021-02-01 DIAGNOSIS — Z3481 Encounter for supervision of other normal pregnancy, first trimester: Secondary | ICD-10-CM

## 2021-02-01 DIAGNOSIS — Z3A10 10 weeks gestation of pregnancy: Secondary | ICD-10-CM

## 2021-02-01 DIAGNOSIS — O26811 Pregnancy related exhaustion and fatigue, first trimester: Secondary | ICD-10-CM

## 2021-02-01 NOTE — Progress Notes (Signed)
Subjective:   Melanie Strong is a 35 y.o. G2P0010 at 40w2dby LMP, c/w 8 week [place] seen today for her first obstetrical visit.  Her obstetrical history is significant for HSV and has Near syncope and Supervision of other normal pregnancy, antepartum on their problem list.. Patient does intend to breast feed. Pregnancy history fully reviewed.  Patient reports fatigue.  HISTORY: OB History  Gravida Para Term Preterm AB Living  2 0 0 0 1 0  SAB IAB Ectopic Multiple Live Births  1 0 0 0 0    # Outcome Date GA Lbr Len/2nd Weight Sex Delivery Anes PTL Lv  2 Current           1 SAB 06/29/20 742w4d  SAB      Past Medical History:  Diagnosis Date  . Glucose found in urine on examination   . Infection    UTI  . No pertinent past medical history    Past Surgical History:  Procedure Laterality Date  . NO PAST SURGERIES     Family History  Problem Relation Age of Onset  . Hypertension Mother   . Diabetes Mother        [place]  . Lupus Mother   . Cancer Paternal Aunt        brain  . Cancer Father        rectal  . Sickle cell anemia Sister   . Healthy Brother   . Healthy Brother   . Other Neg Hx    Social History   Tobacco Use  . Smoking status: Current Every Day Smoker    Packs/day: 0.50    Years: 10.00    Pack years: 5.00    Types: Cigarettes  . Smokeless tobacco: Never Used  Vaping Use  . Vaping Use: Never used  Substance Use Topics  . Alcohol use: No    Comment: rare  . Drug use: No   No Known Allergies Current Outpatient Medications on File Prior to Visit  Medication Sig Dispense Refill  . Blood Pressure Monitoring (BLOOD PRESSURE KIT) DEVI 1 kit by Does not apply route as needed. Large cuff 1 each 0  . metoCLOPramide (REGLAN) 10 MG tablet Take 1 tablet (10 mg total) by mouth 3 (three) times daily with meals as needed for nausea. 30 tablet 3  . Prenatal Vit-Fe Fumarate-FA (PRENATAL MULTIVITAMIN) TABS tablet Take 1 tablet by mouth daily at 12  noon.    . promethazine (PHENERGAN) 12.5 MG tablet Take 1-2 tablets (12.5-25 mg total) by mouth at bedtime as needed for nausea or vomiting. 30 tablet 3   No current facility-administered medications on file prior to visit.     Indications for ASA therapy (per uptodate) One of the following: Previous pregnancy with preeclampsia, especially early onset and with an adverse outcome No Multifetal gestation No Chronic hypertension No Type 1 or 2 diabetes mellitus No Chronic kidney disease No Autoimmune disease (antiphospholipid syndrome, systemic lupus erythematosus) No   Two or more of the following: Nulliparity Yes Obesity (body mass index >30 kg/m2) No Family history of preeclampsia in mother or sister No Age ?35 years No Sociodemographic characteristics (African American race, low socioeconomic level) Yes Personal risk factors (eg, previous pregnancy with low birth weight or small for gestational age infant, previous adverse pregnancy outcome [eg, stillbirth], interval >10 years between pregnancies) No   Indications for early 1 hour GTT (per uptodate)  BMI >25 (>23 in Asian women) AND  one of the following No, BMI 21  Exam   Vitals:   02/01/21 0941  BP: 104/63  Pulse: 81  Weight: 132 lb 12.8 oz (60.2 kg)   Fetal Heart Rate (bpm): 176  VS reviewed, nursing note reviewed,  Constitutional: well developed, well nourished, no distress HEENT: normocephalic CV: normal rate Pulm/chest wall: normal effort Breast Exam: Deferred Abdomen: soft Neuro: alert and oriented x 3 Skin: warm, dry Psych: affect normal Pelvic exam: Cervix pink, visually closed, without lesion, scant white creamy discharge, vaginal walls and external genitalia normal    Assessment:   Pregnancy: G2P0010 Patient Active Problem List   Diagnosis Date Noted  . Supervision of other normal pregnancy, antepartum 01/25/2021  . Near syncope 01/14/2016     Plan:  1. Supervision of other normal pregnancy,  antepartum --Anticipatory guidance about next visits/weeks of pregnancy given. --Next visit in 4 weeks  --GC results in chart from MAU visit - Cytology - PAP( [facility]) - Culture, OB Urine - Genetic Screening - Obstetric Panel, Including HIV - Hepatitis C Antibody  2. [redacted] weeks gestation of pregnancy   3. Fatigue during pregnancy in first trimester --Discussed physiologic changes of pregnancy --Hx of anemia, CBC today    Initial labs drawn. Continue prenatal vitamins. Discussed and offered genetic screening options, including Quad screen/AFP, NIPS testing, and option to decline testing. Benefits/risks/alternatives reviewed. Pt aware that anatomy US is form of genetic screening with lower accuracy in detecting trisomies than blood work.  Pt chooses genetic screening today. NIPS: requested. Ultrasound discussed; fetal anatomic survey: requested. Problem list reviewed and updated. The nature of [place] with multiple MDs and other Advanced Practice Providers was explained to patient; also emphasized that residents, students are part of our team. Routine obstetric precautions reviewed. Return in about 4 weeks (around 03/01/2021).   Fatima Blank, CNM 02/01/21 12:22 PM

## 2021-02-01 NOTE — Patient Instructions (Signed)
Obstetrics: Normal and Problem Pregnancies (7th ed., pp. 102-121). [place], [place]: Elsevier."> Textbook of Family Medicine (9th ed., pp. 365-410). [place], [place]: [employer].">  First Trimester of Pregnancy  The first trimester of pregnancy starts on the first day of your last menstrual period until the end of week 12. This is months 1 through 3 of pregnancy. A week after a sperm fertilizes an egg, the egg will implant into the wall of the uterus and begin to develop into a baby. By the end of 12 weeks, all the baby's organs will be formed and the baby will be 2-3 inches in size. Body changes during your first trimester Your body goes through many changes during pregnancy. The changes vary and generally return to normal after your baby is born. Physical changes  You may gain or lose weight.  Your breasts may begin to grow larger and become tender. The tissue that surrounds your nipples (areola) may become darker.  Dark spots or blotches (chloasma or mask of pregnancy) may develop on your face.  You may have changes in your hair. These can include thickening or thinning of your hair or changes in texture. Health changes  You may feel nauseous, and you may vomit.  You may have heartburn.  You may develop headaches.  You may develop constipation.  Your gums may bleed and may be sensitive to brushing and flossing. Other changes  You may tire easily.  You may urinate more often.  Your menstrual periods will stop.  You may have a loss of appetite.  You may develop cravings for certain kinds of food.  You may have changes in your emotions from day to day.  You may have more vivid and strange dreams. Follow these instructions at home: Medicines  Follow your health care provider's instructions regarding medicine use. Specific medicines may be either safe or unsafe to take during pregnancy. Do not take any medicines unless told to by your health care provider.  Take a  prenatal vitamin that contains at least 600 micrograms (mcg) of folic acid. Eating and drinking  Eat a healthy diet that includes fresh fruits and vegetables, whole grains, good sources of protein such as meat, eggs, or tofu, and low-fat dairy products.  Avoid raw meat and unpasteurized juice, milk, and cheese. These carry germs that can harm you and your baby.  If you feel nauseous or you vomit: ? Eat 4 or 5 small meals a day instead of 3 large meals. ? Try eating a few soda crackers. ? Drink liquids between meals instead of during meals.  You may need to take these actions to prevent or treat constipation: ? Drink enough fluid to keep your urine pale yellow. ? Eat foods that are high in fiber, such as beans, whole grains, and fresh fruits and vegetables. ? Limit foods that are high in fat and processed sugars, such as fried or sweet foods. Activity  Exercise only as directed by your health care provider. Most people can continue their usual exercise routine during pregnancy. Try to exercise for 30 minutes at least 5 days a week.  Stop exercising if you develop pain or cramping in the lower abdomen or lower back.  Avoid exercising if it is very hot or humid or if you are at high altitude.  Avoid heavy lifting.  If you choose to, you may have sex unless your health care provider tells you not to. Relieving pain and discomfort  Wear a good support bra to relieve breast   tenderness.  Rest with your legs elevated if you have leg cramps or low back pain.  If you develop bulging veins (varicose veins) in your legs: ? Wear support hose as told by your health care provider. ? Elevate your feet for 15 minutes, 3-4 times a day. ? Limit salt in your diet. Safety  Wear your seat belt at all times when driving or riding in a car.  Talk with your health care provider if someone is verbally or physically abusive to you.  Talk with your health care provider if you are feeling sad or have  thoughts of hurting yourself. Lifestyle  Do not use hot tubs, steam rooms, or saunas.  Do not douche. Do not use tampons or scented sanitary pads.  Do not use herbal remedies, alcohol, illegal drugs, or medicines that are not approved by your health care provider. Chemicals in these products can harm your baby.  Do not use any products that contain nicotine or tobacco, such as cigarettes, e-cigarettes, and chewing tobacco. If you need help quitting, ask your health care provider.  Avoid cat litter boxes and soil used by cats. These carry germs that can cause birth defects in the baby and possibly loss of the unborn baby (fetus) by miscarriage or stillbirth. General instructions  During routine prenatal visits in the first trimester, your health care provider will do a physical exam, perform necessary tests, and ask you how things are going. Keep all follow-up visits. This is important.  Ask for help if you have counseling or nutritional needs during pregnancy. Your health care provider can offer advice or refer you to specialists for help with various needs.  Schedule a dentist appointment. At home, brush your teeth with a soft toothbrush. Floss gently.  Write down your questions. Take them to your prenatal visits. Where to find more information  American Pregnancy Association: americanpregnancy.org  American College of Obstetricians and Gynecologists: acog.org/en/Womens%20Health/Pregnancy  Office on Women's Health: womenshealth.gov/pregnancy Contact a health care provider if you have:  Dizziness.  A fever.  Mild pelvic cramps, pelvic pressure, or nagging pain in the abdominal area.  Nausea, vomiting, or diarrhea that lasts for 24 hours or longer.  A bad-smelling vaginal discharge.  Pain when you urinate.  Known exposure to a contagious illness, such as chickenpox, measles, Zika virus, HIV, or hepatitis. Get help right away if you have:  Spotting or bleeding from your  vagina.  Severe abdominal cramping or pain.  Shortness of breath or chest pain.  Any kind of trauma, such as from a fall or a car crash.  New or increased pain, swelling, or redness in an arm or leg. Summary  The first trimester of pregnancy starts on the first day of your last menstrual period until the end of week 12 (months 1 through 3).  Eating 4 or 5 small meals a day rather than 3 large meals may help to relieve nausea and vomiting.  Do not use any products that contain nicotine or tobacco, such as cigarettes, e-cigarettes, and chewing tobacco. If you need help quitting, ask your health care provider.  Keep all follow-up visits. This is important. This information is not intended to replace advice given to you by your health care provider. Make sure you discuss any questions you have with your health care provider. Document Revised: 01/26/2020 Document Reviewed: 12/02/2019 Elsevier Patient Education  2021 Elsevier Inc.  

## 2021-02-01 NOTE — Progress Notes (Signed)
NOB in office, pt denies pain. Intake completed on 01-25-21

## 2021-02-02 DIAGNOSIS — O99011 Anemia complicating pregnancy, first trimester: Secondary | ICD-10-CM | POA: Insufficient documentation

## 2021-02-02 LAB — OBSTETRIC PANEL, INCLUDING HIV
Antibody Screen: NEGATIVE
Basophils Absolute: 0 10*3/uL (ref 0.0–0.2)
Basos: 1 %
EOS (ABSOLUTE): 0 10*3/uL (ref 0.0–0.4)
Eos: 1 %
HIV Screen 4th Generation wRfx: NONREACTIVE
Hematocrit: 28.3 % — ABNORMAL LOW (ref 34.0–46.6)
Hemoglobin: 9.3 g/dL — ABNORMAL LOW (ref 11.1–15.9)
Hepatitis B Surface Ag: NEGATIVE
Immature Grans (Abs): 0 10*3/uL (ref 0.0–0.1)
Immature Granulocytes: 0 %
Lymphocytes Absolute: 1.5 10*3/uL (ref 0.7–3.1)
Lymphs: 26 %
MCH: 26.2 pg — ABNORMAL LOW (ref 26.6–33.0)
MCHC: 32.9 g/dL (ref 31.5–35.7)
MCV: 80 fL (ref 79–97)
Monocytes Absolute: 0.5 10*3/uL (ref 0.1–0.9)
Monocytes: 9 %
Neutrophils Absolute: 3.7 10*3/uL (ref 1.4–7.0)
Neutrophils: 63 %
Platelets: 232 10*3/uL (ref 150–450)
RBC: 3.55 x10E6/uL — ABNORMAL LOW (ref 3.77–5.28)
RDW: 13 % (ref 11.7–15.4)
RPR Ser Ql: NONREACTIVE
Rh Factor: POSITIVE
Rubella Antibodies, IGG: 1.01 index (ref >0.99)
WBC: 5.7 10*3/uL (ref 3.4–10.8)

## 2021-02-02 LAB — HEPATITIS C ANTIBODY: Hep C Virus Ab: <0.1 s/co ratio (ref 0.0–0.9)

## 2021-02-02 MED ORDER — ASPIRIN EC 81 MG PO TBEC: 81.0000 mg | DELAYED_RELEASE_TABLET | Freq: Every day | ORAL | 6 refills | Status: DC

## 2021-02-02 MED ORDER — FERROUS SULFATE 325 (65 FE) MG PO TABS: 325.0000 mg | ORAL_TABLET | ORAL | 2 refills | Status: DC

## 2021-02-02 NOTE — Addendum Note (Signed)
Addended by: Sharen Counter A on: 02/02/2021 06:33 PM   Modules accepted: Orders

## 2021-02-03 LAB — CULTURE, OB URINE

## 2021-02-03 LAB — URINE CULTURE, OB REFLEX

## 2021-02-06 LAB — CYTOLOGY - PAP
Comment: NEGATIVE
Diagnosis: NEGATIVE
High risk HPV: NEGATIVE

## 2021-02-07 ENCOUNTER — Inpatient Hospital Stay (HOSPITAL_COMMUNITY)
Admission: AD | Admit: 2021-02-07 | Discharge: 2021-02-07 | Disposition: A | Discharge status: Home / Self Care | Payer: BC Managed Care – PPO | Attending: Obstetrics and Gynecology | Admitting: Obstetrics and Gynecology

## 2021-02-07 ENCOUNTER — Encounter (HOSPITAL_COMMUNITY): Payer: Self-pay | Admitting: Obstetrics and Gynecology

## 2021-02-07 ENCOUNTER — Other Ambulatory Visit: Payer: Self-pay

## 2021-02-07 DIAGNOSIS — Z7982 Long term (current) use of aspirin: Secondary | ICD-10-CM | POA: Diagnosis not present

## 2021-02-07 DIAGNOSIS — Z3A11 11 weeks gestation of pregnancy: Secondary | ICD-10-CM | POA: Diagnosis not present

## 2021-02-07 DIAGNOSIS — R109 Unspecified abdominal pain: Secondary | ICD-10-CM | POA: Diagnosis not present

## 2021-02-07 DIAGNOSIS — O4691 Antepartum hemorrhage, unspecified, first trimester: Secondary | ICD-10-CM

## 2021-02-07 DIAGNOSIS — O209 Hemorrhage in early pregnancy, unspecified: Secondary | ICD-10-CM | POA: Diagnosis present

## 2021-02-07 DIAGNOSIS — O99331 Smoking (tobacco) complicating pregnancy, first trimester: Secondary | ICD-10-CM | POA: Insufficient documentation

## 2021-02-07 DIAGNOSIS — O26891 Other specified pregnancy related conditions, first trimester: Secondary | ICD-10-CM | POA: Diagnosis not present

## 2021-02-07 DIAGNOSIS — F17210 Nicotine dependence, cigarettes, uncomplicated: Secondary | ICD-10-CM | POA: Insufficient documentation

## 2021-02-07 LAB — URINALYSIS, ROUTINE W REFLEX MICROSCOPIC
Bilirubin Urine: NEGATIVE
Glucose, UA: NEGATIVE mg/dL
Ketones, ur: NEGATIVE mg/dL
Leukocytes,Ua: NEGATIVE
Nitrite: NEGATIVE
Protein, ur: NEGATIVE mg/dL
Specific Gravity, Urine: 1.008 (ref 1.005–1.030)
pH: 7 (ref 5.0–8.0)

## 2021-02-07 LAB — WET PREP, GENITAL
Clue Cells Wet Prep HPF POC: NONE SEEN
Sperm: NONE SEEN
Trich, Wet Prep: NONE SEEN
Yeast Wet Prep HPF POC: NONE SEEN

## 2021-02-07 NOTE — MAU Provider Note (Addendum)
History     CSN: [id number]  Arrival date and time: 02/07/21 8413   Event Date/Time   First Provider Initiated Contact with Patient 02/07/21 1021      Chief Complaint  Patient presents with  . Abdominal Pain  . Vaginal Bleeding   Abdominal Pain Pertinent negatives include no constipation.  Vaginal Bleeding Associated symptoms include abdominal pain. Pertinent negatives include no constipation.   LOLETA FROMMELT is a 35 y.o. female G2P0010 at 77w1ddated by L/8 who presents for vaginal bleeding.  Patient reports scant amount of vaginal bleeding noticed this morning. She woke up and noticed a small amount of blood in her underwear ("two small dots") and also noticed a small amount of blood when wiping. When giving a urine sample here, she noticed a small area of blood on her panty liner about the size of her fingertip. She started having some mild left sided discomfort starting yesterday which has been manageable without medications. Has had similar discomfort before and was told it could be stool or from corpus luteum cyst. Denies clots. No hematuria. Denies urinary symptoms, abnormal vaginal discharge, constipation. Last BM yesterday, normal. No recent trauma or intercourse, has not had intercourse since March. Recent pap smear on 6/2. Transvaginal [place] at 826w2dnremarkable with intrauterine pregnancy. She does have a history of SAB at 7w32w4dOB History    Gravida  2   Para      Term      Preterm      AB  1   Living        SAB  1   IAB      Ectopic      Multiple      Live Births              Past Medical History:  Diagnosis Date  . Glucose found in urine on examination   . Infection    UTI  . No pertinent past medical history     Past Surgical History:  Procedure Laterality Date  . NO PAST SURGERIES      Family History  Problem Relation Age of Onset  . Hypertension Mother   . Diabetes Mother        [place]  . Lupus Mother   . Cancer  Paternal Aunt        brain  . Cancer Father        rectal  . Sickle cell anemia Sister   . Healthy Brother   . Healthy Brother   . Other Neg Hx     Social History   Tobacco Use  . Smoking status: Current Every Day Smoker    Packs/day: 0.50    Years: 10.00    Pack years: 5.00    Types: Cigarettes  . Smokeless tobacco: Never Used  Vaping Use  . Vaping Use: Never used  Substance Use Topics  . Alcohol use: No    Comment: rare  . Drug use: No    Allergies: No Known Allergies  Medications Prior to Admission  Medication Sig Dispense Refill Last Dose  . ferrous sulfate (FERROUSUL) 325 (65 FE) MG tablet Take 1 tablet (325 mg total) by mouth every other day. 30 tablet 2 02/06/2021 at Unknown time  . Prenatal Vit-Fe Fumarate-FA (PRENATAL MULTIVITAMIN) TABS tablet Take 1 tablet by mouth daily at 12 noon.   02/06/2021 at Unknown time  . [START ON 02/14/2021] aspirin EC 81 MG tablet Take 1 tablet (81 mg  total) by mouth daily. Swallow whole. 30 tablet 6   . Blood Pressure Monitoring (BLOOD PRESSURE KIT) DEVI 1 kit by Does not apply route as needed. Large cuff 1 each 0   . metoCLOPramide (REGLAN) 10 MG tablet Take 1 tablet (10 mg total) by mouth 3 (three) times daily with meals as needed for nausea. 30 tablet 3   . promethazine (PHENERGAN) 12.5 MG tablet Take 1-2 tablets (12.5-25 mg total) by mouth at bedtime as needed for nausea or vomiting. 30 tablet 3     Review of Systems  Gastrointestinal: Positive for abdominal pain. Negative for constipation.  Genitourinary: Positive for vaginal bleeding.   Physical Exam   Blood pressure 112/63, pulse 83, temperature 99.1 F (37.3 C), temperature source Oral, resp. rate 16, height '5\' 4"'  (1.626 m), weight 60.8 kg, last menstrual period 11/21/2020, SpO2 100 %, unknown if currently breastfeeding.  Physical Exam Vitals and nursing note reviewed. Exam conducted with a chaperone present.  Constitutional:      General: She is not in acute distress.     Appearance: She is well-developed. She is not ill-appearing.  HENT:     Head: Normocephalic and atraumatic.  Eyes:     Extraocular Movements: Extraocular movements intact.  Cardiovascular:     Rate and Rhythm: Normal rate.  Pulmonary:     Effort: Pulmonary effort is normal. No respiratory distress.  Abdominal:     General: Bowel sounds are normal.     Palpations: Abdomen is soft.     Comments: Mild tenderness suprapubic and LLQ  Genitourinary:    Vagina: Normal. No vaginal discharge.     Cervix: Normal.     Comments: Small amount of brownish blood noted in vaginal canal, no active bleeding Skin:    General: Skin is warm and dry.  Neurological:     Mental Status: She is alert.   FHT 175 on triage  MAU Course  Procedures  Results for orders placed or performed during the hospital encounter of 02/07/21 (from the past 48 hour(s))  Urinalysis, Routine w reflex microscopic Urine, Clean Catch     Status: Abnormal   Collection Time: 02/07/21  9:58 AM  Result Value Ref Range   Color, Urine YELLOW YELLOW   APPearance HAZY (A) CLEAR   Specific Gravity, Urine 1.008 1.005 - 1.030   pH 7.0 5.0 - 8.0   Glucose, UA NEGATIVE NEGATIVE mg/dL   Hgb urine dipstick SMALL (A) NEGATIVE   Bilirubin Urine NEGATIVE NEGATIVE   Ketones, ur NEGATIVE NEGATIVE mg/dL   Protein, ur NEGATIVE NEGATIVE mg/dL   Nitrite NEGATIVE NEGATIVE   Leukocytes,Ua NEGATIVE NEGATIVE   RBC / HPF 0-5 0 - 5 RBC/hpf   WBC, UA 0-5 0 - 5 WBC/hpf   Bacteria, UA MANY (A) NONE SEEN   Squamous Epithelial / LPF 0-5 0 - 5   Mucus PRESENT     Comment: Performed at [facility], [address], [place], [place] [address]  Wet prep, genital     Status: Abnormal   Collection Time: 02/07/21 11:34 AM  Result Value Ref Range   Yeast Wet Prep HPF POC NONE SEEN NONE SEEN   Trich, Wet Prep NONE SEEN NONE SEEN   Clue Cells Wet Prep HPF POC NONE SEEN NONE SEEN   WBC, Wet Prep HPF POC MANY (A) NONE SEEN   Sperm NONE SEEN      Comment: Performed at [facility], [place] [address] [place], [place] [address]  MDM UA unremarkable. Wet prep and GC/chlamydia collected to rule out infectious causes. Recent US at 8w confirming intrauterine pregnancy.  Assessment and Plan   Vaginal bleeding Suspect vaginal bleeding traumatic from recent pap smear. Ectopic pregnancy unlikely (confirmed intrauterine pregnancy on [place]). Wet prep unremarkable. GC/chlamydia pending. Fetal status reassuring. Discharged in stable condition, return precautions given.  Richard Sun 02/07/2021, 10:34 AM     Attestation of Supervision of Student:  I confirm that I have verified the information documented in the resident's note and that I have also personally performed the history, physical exam and all medical decision making activities.  I have verified that all services and findings are accurately documented in this student's note; and I agree with management and plan as outlined in the documentation. I have also made any necessary editorial changes.  HPI: YAZMEEN WOOLF is a 35 y.o. G2P0010 at 15w1dwho presents  with vaginal bleeding. Noticed Dudding spotting. Also reports mild abdominal cramping. Denies discharge, dysuria, n/v/d, fever. No recent intercourse. Had a pap smear on Friday.   ROS: Review of Systems  Gastrointestinal: Positive for abdominal pain. Negative for constipation.  Genitourinary: Positive for vaginal bleeding.   Exam: BP (!) 111/59 (BP Location: Right Arm)   Pulse 68   Temp 98 F (36.7 C) (Oral)   Resp 16   Ht '5\' 4"'  (1.626 m)   Wt 60.8 kg   LMP 11/21/2020   SpO2 100%   BMI 23.02 kg/m   Physical Examination: General appearance - alert, well appearing, and in no distress Mental status - normal mood, behavior, speech, dress, motor activity, and thought processes Eyes - sclera anicteric Abdomen - soft, nontender, nondistended, no masses or organomegaly Pelvic - VULVA: normal appearing vulva with no  masses, tenderness or lesions, VAGINA: minimal amount of tan discharge; no bleeding, CERVIX: normal appearing cervix without discharge or lesions. Cervix visually closed.   MDM: Present with Kutz spotting. No blood on exam and cervix closed. FHT present via doppler. Suspect symptoms related to recent pap smear. She is RH positive.   A/P: 1. Vaginal bleeding in pregnancy, first trimester   2. [redacted] weeks gestation of pregnancy    -reviewed bleeding precautions & reasons to return to MAU -GC/CT pending  EJorje Guild NSuamicofor WDean Foods Company CBaldwin Harbor6/04/2021 10:09 PM

## 2021-02-07 NOTE — Discharge Instructions (Signed)
Vaginal Bleeding During Pregnancy, First Trimester A small amount of bleeding from the vagina, or spotting, is common during early pregnancy. Some bleeding may be related to the pregnancy, and some may not. In many cases, the bleeding is normal and is not a problem. However, bleeding can also be a sign of something serious. Normal things that may cause bleeding during the first trimester:  Implantation of the fertilized egg in the lining of the uterus.  Rapid changes in blood vessels. This is caused by changes that are happening to the body during pregnancy.  Sex.  Pelvic exams. Abnormal things that may cause bleeding during the first trimester include:  Infection or inflammation of the cervix.  Growths or polyps on the cervix.  Miscarriage or threatened miscarriage.  Pregnancy that is growing outside of the uterus (ectopic pregnancy).  A fertilized egg that becomes a mass of tissue (molar pregnancy). Tell your health care provider right away if there is any bleeding from your vagina. Follow these instructions at home: Monitoring your bleeding Monitor your bleeding.  Pay attention to any changes in your symptoms. Let your health care provider know about any concerns.  Try to understand when the bleeding occurs. Does the bleeding start on its own, or does it start after something is done, such as sex or a pelvic exam?  Use a diary to record the things you see about your bleeding, including: ? The kind of bleeding you are having. Does the bleeding start and stop irregularly, or is it a constant flow? ? The severity of your bleeding. Is the bleeding heavy or light? ? The number of pads you use each day, how often you change them, and how soaked they are.  Tell your health care provider if you pass tissue. He or she may want to see it.   Activity  Follow instructions from your health care provider about limiting your activity. Ask what activities are safe for you.  Do not have  sex until your health care provider says that this is safe.  If needed, make plans for someone to help with your regular activities. General instructions  Take over-the-counter and prescription medicines only as told by your health care provider.  Do not take aspirin because it can cause bleeding.  Do not use tampons or douche.  Keep all follow-up visits. This is important. Contact a health care provider if:  You have vaginal bleeding during any part of your pregnancy.  You have cramps or labor pains.  You have a fever or chills. Get help right away if:  You have severe cramps in your back or abdomen.  You pass large clots or a large amount of tissue from your vagina.  Your bleeding increases.  You feel light-headed or weak, or you faint.  You are leaking fluid or have a gush of fluid from your vagina. Summary  A small amount of bleeding from the vagina is common during early pregnancy.  Be sure to tell your health care provider about any vaginal bleeding right away.  Try to understand when bleeding occurs. Does bleeding occur on its own, or does it occur after something is done, such as sex or pelvic exams?  Keep all follow-up visits. This is important. This information is not intended to replace advice given to you by your health care provider. Make sure you discuss any questions you have with your health care provider. Document Revised: 05/11/2020 Document Reviewed: 05/11/2020 Elsevier Patient Education  2021 Elsevier Inc.  

## 2021-02-07 NOTE — MAU Note (Signed)
Melanie Strong is a 35 y.o. at [redacted]w[redacted]d here in MAU reporting: this AM when she used the bathroom she saw some bleeding when she wiped. Is wearing a panty liner. Having some left sided discomfort. No recent IC. No other abnormal discharge.  Onset of complaint: today  Pain score: 1/10  Vitals:   02/07/21 0945  BP: 112/63  Pulse: 83  Resp: 16  Temp: 99.1 F (37.3 C)  SpO2: 100%     FHT: 175  Lab orders placed from triage: UA

## 2021-02-08 ENCOUNTER — Encounter: Payer: Self-pay | Admitting: Advanced Practice Midwife

## 2021-02-08 LAB — GC/CHLAMYDIA PROBE AMP (~~LOC~~) NOT AT ARMC
Chlamydia: NEGATIVE
Comment: NEGATIVE
Comment: NORMAL
Neisseria Gonorrhea: NEGATIVE

## 2021-02-09 ENCOUNTER — Telehealth: Payer: Self-pay

## 2021-02-09 NOTE — Telephone Encounter (Signed)
Pt called stating she has abdominal cramping and vaginal bleeding. Pt denies recent intercourse. Pt was seen in MAU (06-08, refer to note)  Pt advised she can take tylenol for pain. Pt told if symptoms become worse, report back to MAU for evaluation. Pt states she is currently at work. Explained to pt per Chrissie Noa there is nothing we can do to stop bleeding.   Pt is not happy with advice given.

## 2021-02-13 ENCOUNTER — Telehealth: Payer: Self-pay | Admitting: Advanced Practice Midwife

## 2021-02-13 DIAGNOSIS — D563 Thalassemia minor: Secondary | ICD-10-CM | POA: Insufficient documentation

## 2021-02-13 DIAGNOSIS — Z148 Genetic carrier of other disease: Secondary | ICD-10-CM | POA: Insufficient documentation

## 2021-02-13 NOTE — Telephone Encounter (Signed)
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  I have placed referral for genetic counseling and called to leave a message for patient to return call about her results.

## 2021-02-14 ENCOUNTER — Encounter: Payer: Self-pay | Admitting: Advanced Practice Midwife

## 2021-03-01 ENCOUNTER — Encounter: Payer: BC Managed Care – PPO | Admitting: Advanced Practice Midwife

## 2021-03-06 ENCOUNTER — Encounter: Payer: Self-pay | Admitting: Obstetrics

## 2021-03-06 ENCOUNTER — Ambulatory Visit (INDEPENDENT_AMBULATORY_CARE_PROVIDER_SITE_OTHER): Payer: BC Managed Care – PPO | Admitting: Obstetrics

## 2021-03-06 ENCOUNTER — Other Ambulatory Visit: Payer: Self-pay

## 2021-03-06 VITALS — BP 103/53 | HR 79 | Wt 138.1 lb

## 2021-03-06 DIAGNOSIS — K5900 Constipation, unspecified: Secondary | ICD-10-CM

## 2021-03-06 DIAGNOSIS — O99011 Anemia complicating pregnancy, first trimester: Secondary | ICD-10-CM

## 2021-03-06 DIAGNOSIS — Z3480 Encounter for supervision of other normal pregnancy, unspecified trimester: Secondary | ICD-10-CM

## 2021-03-06 DIAGNOSIS — D563 Thalassemia minor: Secondary | ICD-10-CM

## 2021-03-06 DIAGNOSIS — Z148 Genetic carrier of other disease: Secondary | ICD-10-CM

## 2021-03-06 DIAGNOSIS — O99619 Diseases of the digestive system complicating pregnancy, unspecified trimester: Secondary | ICD-10-CM

## 2021-03-06 DIAGNOSIS — Z3A15 15 weeks gestation of pregnancy: Secondary | ICD-10-CM

## 2021-03-06 MED ORDER — DOCUSATE SODIUM 100 MG PO CAPS: 100.0000 mg | ORAL_CAPSULE | Freq: Two times a day (BID) | ORAL | 6 refills | Status: DC

## 2021-03-06 NOTE — Progress Notes (Signed)
   Patient in clinic for ROB. Patient reported yesterday after having a bowel movement, when she wiped she show red blood on the tissue. Denies vaginal bleeding.   Clovis Pu, RN

## 2021-03-06 NOTE — Progress Notes (Signed)
Subjective:  Melanie Strong is a 35 y.o. G2P0010 at [redacted]w[redacted]d being seen today for ongoing prenatal care.  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  Patient reports  constipation .  Contractions: Not present. Vag. Bleeding: None.  Movement: Present. Denies leaking of fluid.   The following portions of the patient's history were reviewed and updated as appropriate: allergies, current medications, past family history, past medical history, past social history, past surgical history and problem list. Problem list updated.  Objective:   Vitals:   03/06/21 1011  BP: (!) 103/53  Pulse: 79  Weight: 138 lb 1.6 oz (62.6 kg)    Fetal Status: Fetal Heart Rate (bpm): 161   Movement: Present     General:  Alert, oriented and cooperative. Patient is in no acute distress.  Skin: Skin is warm and dry. No rash noted.   Cardiovascular: Normal heart rate noted  Respiratory: Normal respiratory effort, no problems with respiration noted  Abdomen: Soft, gravid, appropriate for gestational age. Pain/Pressure: Absent     Pelvic:  Cervical exam deferred        Extremities: Normal range of motion.  Edema: None  Mental Status: Normal mood and affect. Normal behavior. Normal judgment and thought content.   Urinalysis:      Assessment and Plan:  Pregnancy: G2P0010 at [redacted]w[redacted]d  1. Supervision of other normal pregnancy, antepartum Rx: - [place] MFM OB COMP + 14 WK; Future  2. Constipation during pregnancy, antepartum Rx: - docusate sodium (COLACE) 100 MG capsule; Take 1 capsule (100 mg total) by mouth 2 (two) times daily.  Dispense: 60 capsule; Refill: 6  3. Carrier of spinal muscular atrophy  4. Alpha thalassemia silent carrier  5.  Anemia affecting pregnancy in first trimester   Preterm labor symptoms and general obstetric precautions including but not limited to vaginal bleeding, contractions, leaking of fluid and fetal movement were reviewed in detail with the patient. Please refer to After Visit Summary for other counseling recommendations.    Return in about 4 weeks (around 04/03/2021) for ROB.   Brock Bad, MD  03/06/21

## 2021-03-30 ENCOUNTER — Encounter (HOSPITAL_COMMUNITY): Payer: Self-pay

## 2021-03-30 ENCOUNTER — Other Ambulatory Visit: Payer: Self-pay

## 2021-03-30 ENCOUNTER — Inpatient Hospital Stay (HOSPITAL_COMMUNITY)
Admission: AD | Admit: 2021-03-30 | Discharge: 2021-03-30 | Disposition: A | Discharge status: Home / Self Care | Payer: BC Managed Care – PPO | Attending: Obstetrics and Gynecology | Admitting: Obstetrics and Gynecology

## 2021-03-30 DIAGNOSIS — O99352 Diseases of the nervous system complicating pregnancy, second trimester: Secondary | ICD-10-CM | POA: Insufficient documentation

## 2021-03-30 DIAGNOSIS — Z79899 Other long term (current) drug therapy: Secondary | ICD-10-CM | POA: Insufficient documentation

## 2021-03-30 DIAGNOSIS — Z3A18 18 weeks gestation of pregnancy: Secondary | ICD-10-CM | POA: Insufficient documentation

## 2021-03-30 DIAGNOSIS — Z8249 Family history of ischemic heart disease and other diseases of the circulatory system: Secondary | ICD-10-CM | POA: Insufficient documentation

## 2021-03-30 DIAGNOSIS — F17210 Nicotine dependence, cigarettes, uncomplicated: Secondary | ICD-10-CM | POA: Diagnosis not present

## 2021-03-30 DIAGNOSIS — O26892 Other specified pregnancy related conditions, second trimester: Secondary | ICD-10-CM | POA: Insufficient documentation

## 2021-03-30 DIAGNOSIS — Z7982 Long term (current) use of aspirin: Secondary | ICD-10-CM | POA: Diagnosis not present

## 2021-03-30 DIAGNOSIS — G43009 Migraine without aura, not intractable, without status migrainosus: Secondary | ICD-10-CM | POA: Diagnosis not present

## 2021-03-30 DIAGNOSIS — O99332 Smoking (tobacco) complicating pregnancy, second trimester: Secondary | ICD-10-CM | POA: Insufficient documentation

## 2021-03-30 LAB — URINALYSIS, ROUTINE W REFLEX MICROSCOPIC
Bilirubin Urine: NEGATIVE
Glucose, UA: NEGATIVE mg/dL
Hgb urine dipstick: NEGATIVE
Ketones, ur: NEGATIVE mg/dL
Nitrite: NEGATIVE
Protein, ur: NEGATIVE mg/dL
Specific Gravity, Urine: 1.017 (ref 1.005–1.030)
pH: 6 (ref 5.0–8.0)

## 2021-03-30 MED ORDER — METOCLOPRAMIDE HCL 10 MG PO TABS
10.0000 mg | ORAL_TABLET | Freq: Once | ORAL | Status: AC
  Administered 2021-03-30: 10 mg via ORAL
  Filled 2021-03-30: qty 1

## 2021-03-30 MED ORDER — ACETAMINOPHEN 500 MG PO TABS
1000.0000 mg | ORAL_TABLET | Freq: Once | ORAL | Status: AC
  Administered 2021-03-30: 1000 mg via ORAL
  Filled 2021-03-30: qty 2

## 2021-03-30 NOTE — MAU Provider Note (Signed)
History    [id number]  Arrival date and time: 03/30/21 4854   Chief Complaint  Patient presents with   Headache    HPI Melanie Strong is a 35 y.o. G2P0010 at 33w3dby LMP c/w 8 week [place] PMHx notable for anemia who presents to MAU for a headache. Patient reports that her headache started around 4 PM yesterday. It is located behind both of her eyes and is throbbing in nature. She states that pain is severe. She has not had associated vision changes, nausea, or weakness. She tried to go home after work and sleep it off, but she woke up again early this morning with the headache. She took Tylenol this morning which improved her symptoms briefly for about 15 minutes but then it came back. She has not had anything to eat or drink since 2 PM yesterday since she has not felt like eating with her headache. She reports a history of migraines previously and did have improvement with abortive medications, but she does not take these currently. Due to the severity of the pain, she presented to MAU for further evaluation.  Vaginal bleeding: No LOF: No Fetal Movement: Yes Contractions: No  O/Positive/-- (06/02 1059)  OB History     Gravida  2   Para      Term      Preterm      AB  1   Living         SAB  1   IAB      Ectopic      Multiple      Live Births              Past Medical History:  Diagnosis Date   Glucose found in urine on examination    Infection    UTI   No pertinent past medical history     Past Surgical History:  Procedure Laterality Date   NO PAST SURGERIES      Family History  Problem Relation Age of Onset   Hypertension Mother    Diabetes Mother        Boarderline   Lupus Mother    Cancer Paternal Aunt        brain   Cancer Father        rectal   Sickle cell anemia Sister    Healthy Brother    Healthy Brother    Other Neg Hx     Social History   Socioeconomic History   Marital status: Married    Spouse name: Not on file    Number of children: Not on file   Years of education: Not on file   Highest education level: Not on file  Occupational History   Not on file  Tobacco Use   Smoking status: Every Day    Packs/day: 0.50    Years: 10.00    Pack years: 5.00    Types: Cigarettes   Smokeless tobacco: Never  Vaping Use   Vaping Use: Never used  Substance and Sexual Activity   Alcohol use: No    Comment: rare   Drug use: No   Sexual activity: Yes    Birth control/protection: None  Other Topics Concern   Not on file  Social History Narrative   Not on file   Social Determinants of Health   Financial Resource Strain: Not on file  Food Insecurity: No Food Insecurity   Worried About Running Out of Food in the Last Year: Never true  Ran Out of Food in the Last Year: Never true  Transportation Needs: No Transportation Needs   Lack of Transportation (Medical): No   Lack of Transportation (Non-Medical): No  Physical Activity: Not on file  Stress: Not on file  Social Connections: Not on file  Intimate Partner Violence: Not on file    No Known Allergies  No current facility-administered medications on file prior to encounter.   Current Outpatient Medications on File Prior to Encounter  Medication Sig Dispense Refill   acetaminophen (TYLENOL) 500 MG tablet Take 500 mg by mouth every 6 (six) hours as needed.     aspirin EC 81 MG tablet Take 1 tablet (81 mg total) by mouth daily. Swallow whole. 30 tablet 6   Blood Pressure Monitoring (BLOOD PRESSURE KIT) DEVI 1 kit by Does not apply route as needed. Large cuff 1 each 0   docusate sodium (COLACE) 100 MG capsule Take 1 capsule (100 mg total) by mouth 2 (two) times daily. 60 capsule 6   ferrous sulfate (FERROUSUL) 325 (65 FE) MG tablet Take 1 tablet (325 mg total) by mouth every other day. 30 tablet 2   Prenatal Vit-Fe Fumarate-FA (PRENATAL MULTIVITAMIN) TABS tablet Take 1 tablet by mouth daily at 12 noon.     metoCLOPramide (REGLAN) 10 MG tablet Take 1  tablet (10 mg total) by mouth 3 (three) times daily with meals as needed for nausea. 30 tablet 3   promethazine (PHENERGAN) 12.5 MG tablet Take 1-2 tablets (12.5-25 mg total) by mouth at bedtime as needed for nausea or vomiting. 30 tablet 3     ROS Pertinent positives and negative per HPI, all others reviewed and negative  Physical Exam   BP 119/60   Pulse 89   Temp 98.5 F (36.9 C) (Oral)   Resp 16   Ht _0  (1.626 m)   Wt 64.9 kg   LMP 11/21/2020   SpO2 100%   BMI 24.55 kg/m   Physical Exam Constitutional:      General: She is not in acute distress.    Appearance: She is well-developed.  HENT:     Head: Normocephalic and atraumatic.  Eyes:     Extraocular Movements: Extraocular movements intact.     Pupils: Pupils are equal, round, and reactive to light.  Cardiovascular:     Rate and Rhythm: Normal rate and regular rhythm.     Heart sounds: No murmur heard. Pulmonary:     Effort: Pulmonary effort is normal.     Breath sounds: Normal breath sounds.  Abdominal:     General: Bowel sounds are normal.     Palpations: Abdomen is soft.     Tenderness: There is no abdominal tenderness.  Musculoskeletal:        General: No swelling.     Cervical back: Normal range of motion.  Skin:    General: Skin is warm and dry.  Neurological:     Mental Status: She is alert and oriented to person, place, and time.     Motor: No weakness.  Psychiatric:        Mood and Affect: Mood normal.        Behavior: Behavior normal.   Labs Results for orders placed or performed during the hospital encounter of 03/30/21 (from the past 24 hour(s))  Urinalysis, Routine w reflex microscopic     Status: Abnormal   Collection Time: 03/30/21 11:15 AM  Result Value Ref Range   Color, Urine YELLOW YELLOW   APPearance HAZY (  A) CLEAR   Specific Gravity, Urine 1.017 1.005 - 1.030   pH 6.0 5.0 - 8.0   Glucose, UA NEGATIVE NEGATIVE mg/dL   Hgb urine dipstick NEGATIVE NEGATIVE   Bilirubin Urine  NEGATIVE NEGATIVE   Ketones, ur NEGATIVE NEGATIVE mg/dL   Protein, ur NEGATIVE NEGATIVE mg/dL   Nitrite NEGATIVE NEGATIVE   Leukocytes,Ua TRACE (A) NEGATIVE   RBC / HPF 0-5 0 - 5 RBC/hpf   WBC, UA 6-10 0 - 5 WBC/hpf   Bacteria, UA MANY (A) NONE SEEN   Squamous Epithelial / LPF 6-10 0 - 5   Mucus PRESENT     Imaging No results found.  Assessment and Plan  Procedures  Lab Orders  Culture, OB Urine  Urinalysis, Routine w reflex microscopic  No orders of the defined types were placed in this encounter.  Imaging Orders  No imaging studies ordered today   MDM Patient presents with one day of acute headache, most consistent with migraine headache. No improvement with PO Tylenol at home prior to arrival. Hx migraines in the past. No red flag symptoms and neurological exam normal in MAU.  Tylenol 1000 mg and Reglan 10 mg ordered - will reassess post treatment.  UA obtained on admission with trace leukocytes and many bacteria. Culture ordered and pending.   Upon reassessment, headache resolved. Stable for discharge home. Counseled patient regarding headache prevention measures and recommended that she take Tylenol 1000 mg at the onset of her symptoms if they recur in the future. Advised that she can also take Tylenol with Reglan 10 mg for severe symptoms or those that do not resolve with Tylenol alone. Patient to follow up with her OB/GYN outpatient. Strict return precautions reviewed and patient voiced understanding.  Migraine without aura and without status migrainosus, not intractable - Plan: Discharge patient  Genia Del, MD Obstetrics Fellow  03/30/2021 11:03 AM

## 2021-03-30 NOTE — ED Provider Notes (Signed)
Emergency Medicine Provider Triage Evaluation Note  Melanie Strong , a 35 y.o. female  was evaluated in triage.  Pt complains of severe headache that started last night in context of 18 weeks of pregnancy.  No blurry or double vision.  Review of Systems  Positive: Headache Negative: Abdominal pain, nausea, vomiting, vaginal discharge or bleeding, decreased fetal movement, blurry or double vision  Physical Exam  BP 132/70 (BP Location: Right Arm)   Pulse (!) 103   Temp 98.5 F (36.9 C) (Oral)   Resp 16   LMP 11/21/2020   SpO2 100%  Gen:   Awake, no distress   Resp:  Normal effort  MSK:   Moves extremities without difficulty  Other:  Tearful.  Abdomen soft, gestational, nontender  Medical Decision Making  Medically screening exam initiated at 9:56 AM.  Appropriate orders placed.  Melanie Strong was informed that the remainder of the evaluation will be completed by another provider, this initial triage assessment does not replace that evaluation, and the importance of remaining in the ED until their evaluation is complete.  Case discussed with MAU APP, patient has been accepted in their department.  This chart was dictated using voice recognition software, Dragon. Despite the best efforts of this provider to proofread and correct errors, errors may still occur which can change documentation meaning.    Melanie Lore, PA-C 03/30/21 0957    Cathren Laine, MD 03/31/21 [phone]

## 2021-03-30 NOTE — ED Triage Notes (Signed)
Patient complains of headache x 1 day and reports that she is P2, G0. Denies trauma, alert and oriented

## 2021-03-30 NOTE — MAU Note (Signed)
Pt transfer from ED, presented there with really headache. Headache started yesterday, worsened overnight. Unrelieved with tylenol. Denies nausea, vomiting, fever.

## 2021-03-30 NOTE — Discharge Instructions (Addendum)
Make sure you are eating and drinking regularly - about every 3-4 hours. Get plenty of sleep. If you develop a headache - make sure to take your Tylenol 1000 mg at the onset of your symptoms. You can take Tylenol with Reglan (Metoclopramide) if it is severe and not improving with Tylenol alone.  Discuss with your OB/GYN if you headaches come back frequently, as you may be a candidate for preventative treatments in the future if needed.

## 2021-03-31 LAB — CULTURE, OB URINE

## 2021-04-03 ENCOUNTER — Other Ambulatory Visit: Payer: Self-pay | Admitting: Obstetrics

## 2021-04-03 ENCOUNTER — Other Ambulatory Visit: Payer: Self-pay | Admitting: *Deleted

## 2021-04-03 ENCOUNTER — Other Ambulatory Visit: Payer: Self-pay

## 2021-04-03 ENCOUNTER — Ambulatory Visit (INDEPENDENT_AMBULATORY_CARE_PROVIDER_SITE_OTHER): Payer: BC Managed Care – PPO | Admitting: Obstetrics

## 2021-04-03 ENCOUNTER — Ambulatory Visit: Payer: BC Managed Care – PPO | Attending: Obstetrics

## 2021-04-03 VITALS — BP 117/70 | HR 101 | Wt 147.0 lb

## 2021-04-03 DIAGNOSIS — Z3A15 15 weeks gestation of pregnancy: Secondary | ICD-10-CM | POA: Insufficient documentation

## 2021-04-03 DIAGNOSIS — Z362 Encounter for other antenatal screening follow-up: Secondary | ICD-10-CM

## 2021-04-03 DIAGNOSIS — Z3480 Encounter for supervision of other normal pregnancy, unspecified trimester: Secondary | ICD-10-CM | POA: Insufficient documentation

## 2021-04-03 DIAGNOSIS — O99011 Anemia complicating pregnancy, first trimester: Secondary | ICD-10-CM

## 2021-04-03 DIAGNOSIS — G5600 Carpal tunnel syndrome, unspecified upper limb: Secondary | ICD-10-CM

## 2021-04-03 DIAGNOSIS — O26899 Other specified pregnancy related conditions, unspecified trimester: Secondary | ICD-10-CM

## 2021-04-03 NOTE — Progress Notes (Signed)
Subjective:  Melanie Strong is a 35 y.o. G2P0010 at [redacted]w[redacted]d being seen today for ongoing prenatal care.  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  Patient reports backache and carpal tunnel symptoms.  Contractions: Not present. Vag. Bleeding: None.  Movement: Present. Denies leaking of fluid.   The following portions of the patient's history were reviewed and updated as appropriate: allergies, current medications, past family history, past medical history, past social history, past surgical history and problem list. Problem list updated.  Objective:   Vitals:   04/03/21 1105  BP: 117/70  Pulse: (!) 101  Weight: 147 lb (66.7 kg)    Fetal Status:     Movement: Present     General:  Alert, oriented and cooperative. Patient is in no acute distress.  Skin: Skin is warm and dry. No rash noted.   Cardiovascular: Normal heart rate noted  Respiratory: Normal respiratory effort, no problems with respiration noted  Abdomen: Soft, gravid, appropriate for gestational age. Pain/Pressure: Absent     Pelvic:  Cervical exam deferred        Extremities: Normal range of motion.     Mental Status: Normal mood and affect. Normal behavior. Normal judgment and thought content.   Urinalysis:      Assessment and Plan:  Pregnancy: G2P0010 at [redacted]w[redacted]d  1. Supervision of other normal pregnancy, antepartum  2. Anemia affecting pregnancy in first trimester - taking iron  3. Carpal tunnel syndrome during pregnancy - wrist splints recommended   Preterm labor symptoms and general obstetric precautions including but not limited to vaginal bleeding, contractions, leaking of fluid and fetal movement were reviewed in detail with the  patient. Please refer to After Visit Summary for other counseling recommendations.   Return in about 4 weeks (around 05/01/2021) for ROB.   Brock Bad, MD  04/03/21

## 2021-04-18 ENCOUNTER — Ambulatory Visit: Payer: BC Managed Care – PPO | Attending: Maternal & Fetal Medicine

## 2021-04-18 ENCOUNTER — Other Ambulatory Visit: Payer: Self-pay

## 2021-04-18 DIAGNOSIS — Z148 Genetic carrier of other disease: Secondary | ICD-10-CM

## 2021-04-18 DIAGNOSIS — D563 Thalassemia minor: Secondary | ICD-10-CM

## 2021-04-19 NOTE — Progress Notes (Addendum)
Referring provider: Dr. 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  We reviewed those results as well as additional screening and testing options for this pregnancy. The patient was present at this visit with her husband, Melanie Strong (dob [birth year]).  Alpha thalassemia Silent carrier status The results of the screen identified Melanie Strong as a silent carrier for alpha-thalassemia (aa/a-). Alpha-thalassemia is different in its inheritance compared to other hemoglobinopathies as there are two copies of two alpha globin genes (HBA1 and HBA2) on each chromosome 16, or four alpha globin genes total (aa/aa). A person can be a carrier of one alpha gene mutation (aa/a-), also referred to as a "silent carrier". A person who carries two alpha globin gene mutations can either carry them in cis (both on the same chromosome, denoted as aa/--) or in trans (on different chromosomes, denoted as a-/a-). Alpha-thalassemia carriers of two mutations who have African American ancestry are more likely to have a trans arrangement (a-/a-); cis configuration is reported to be rare in individuals with African American ancestry.     There are several different forms of alpha-thalassemia. The most severe form of alpha-thalassemia, Hb Barts, is associated with an absence of alpha globin chain synthesis as a result of deletions of all four alpha globin genes (--/--).  Given that Melanie Strong is a silent carrier (aa/a-), her pregnancies would not be at increased risk for Hb Barts, even if her partner is a carrier for alpha-thalassemia, as she will always pass on at least one copy of the alpha globin gene to her children. Hemoglobin H (HbH) disease  is caused by three deleted or dysfunctioning alpha globin alleles (a-/--) and is characterized by microcytic hypochromic hemolytic anemia, hepatosplenomegaly, mild jaundice, growth retardation, and sometimes thalassemia-like bone changes. Given Melanie Strong's silent carrier status (aa/a-), the current fetus would only be at risk for HbH disease (a-/--), if her partner is a carrier for two alpha globin mutations in cis (aa/--). If this is the case, the risk for HbH disease in the pregnancy would be 1 in 4 (25%). However, if Melanie Strong's partner is a carrier for two alpha globin mutations, he would be more likely to carry them in trans configuration (a-/a-) than the cis configuration (aa/--), given his ethnicity. If he is a carrier of alpha-thalassemia in trans, then the pregnancy would not be at increased risk for HbH disease and would at most be a carrier of two gene changes in trans (a-/a-).  Carriers may have mild anemia, or small red blood cells (low MCV on CBC), but are expected to be healthy. Based on the carrier frequency for alpha-thalassemia in the African American population, Melanie Strong's partner has a 1 in 30 chance of being any type of carrier for alpha-thalassemia.     Beta thalassemia carrier status Beta-thalassemia is classified into two subtypes depending on the severity of an individual's symptoms. Thalassemia major, AKA Cooley's anemia, is the more severe subtype. Children with thalassemia major develop life-threatening anemia within the first two years of life. Symptoms in children with thalassemia major may include failure to thrive, jaundice, hepatosplenomegaly (enlarged liver and spleen), cardiomegaly (enlarged heart), thalassemia-related bony changes, and delayed puberty. Individuals with thalassemia major often require frequent  blood transfusions to replenish their supply of red blood cells. Over time, an influx of iron-containing hemoglobin from chronic blood transfusions can lead to iron  overload in the body, resulting in liver, heart, and hormone problems in affected individuals. Thalassemia intermedia is the milder subtype of beta-thalassemia. Onset of thalassemia intermedia symptoms occurs in early childhood or later in life. Affected individuals may experience mild to moderate anemia, slow growth, and thalassemia-related bony changes.    Beta-thalassemia is caused by changes in the HBB gene. The HBB gene provides instructions for making a protein called beta-globin, which is a component of hemoglobin. Some pathogenic variants in the HBB gene prevent the production of any beta-globin in the body. The condition caused by the absence of beta-globin is referred to as beta-zero (?0) thalassemia. Other pathogenic variants in the HBB gene allow for the production of a reduced amount of beta-globin in the body. The condition caused by a reduced amount of beta-globin is referred to as beta-plus (?+) thalassemia. A deficiency or complete lack of beta-globin leads to a reduced amount of functional hemoglobin in the body, causing a shortage of mature red blood cells. This results in the features associated with beta-thalassemia. The amount of beta-globin that is produced in the body does not necessarily predict disease severity, as individuals with both ?0 and ?+ have been diagnosed with both clinical subtypes of beta-thalassemia.   Beta-thalassemia is inherited in an autosomal recessive pattern. This means that the current fetus is only at risk for beta-thalassemia if Melanie Strong's partner is also a carrier for the condition.  It is also possible that Melanie Strong's partner could have a different variant in the HBB gene that could make him a carrier of sickle cell disease, AKA sickle cell anemia (SCA). If he did, the couple would have a 1 in 4 (25%) chance of having a child with a condition called sickle beta thalassemia. The severity of sickle beta thalassemia depends on the normal amount of beta globin  that is produced. If an individual produces no beta globin (sickle beta-zero thalassemia), they will experience symptoms similar to SCA. If an individual produces a reduced amount of beta globin (sickle beta-plus thalassemia), they may experience symptoms that are similar to a milder form of SCA. Individuals with SCA have red blood cells that can sickle and obstruct blood flow in small blood vessels, causing ischemia of tissues and organs and episodes of vaso-occlusive crisis. Additional complications associated with SCA may include organ damage, frequent infections, acute chest syndrome, ischemic stroke, splenic sequestration, priapism, and pulmonary hypertension. Melanie Strong to have carrier screening today, so we will follow up on these risks once his results become available.  Spinal Muscular Atrophy carrier screening results SMA is a recessive genetic condition with variable age of onset and severity caused by mutations in the SMN1 gene.  To review, a recessive condition occurs in a child when both the mother and the father are carriers of the genetic condition and both pass on that altered gene to the child.  The features of SMA are caused by the loss of motor neurons which leads to progressive muscle weakness and muscle wasting (atrophy) in infancy.  There are several types of SMA based upon severity, but in the most common type (type 1), children may pass away as early as 32 years of age.  Carrier testing assesses the number of copies of the SMN1 gene, from zero to four.  Persons with one copy of the SMN1 gene are carriers,  and those with no copies are affected with the condition.  Individuals with two or more copies have a reduced chance to be a carrier.  Persons with 3 or 4 copies of this gene are highly unlikely to be carriers.  However, someone with two copies of the gene can either have two copies of this gene on separate chromosomes and thus not be a carrier or they could have two copies of  this gene on the same chromosome with no copies on the other chromosome and be a "silent" carrier.  This silent carrier status is known to be more common in persons of African American or Ashkenazi Jewish ancestry, particularly when they are found to be positive for a variant in the gene known as c. *3+80T>G. 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  This testing cannot confirm or eliminate the chance to have a child with SMA. It is estimated that current carrier screening can detect 94.8% of carriers in the Caucasian population and 70.5% of carriers in the African American population.    The next option we reviewed is to have her partner tested to determine his status.  If he is found to be a carrier, then the pregnancy may be at increased risk for SMA and testing would be offered during the pregnancy through amniocentesis or at the time of birth. At this time, prior to testing, Melanie Strong has a chance of 1 in 7772 to be a carrier given his African American ancestry and no known family history of SMA.  Overall, the chance for this couple to have a child with SMA at this time is 1/72 X 1/34 X = 1 in 9,792. If he were to have testing that showed a low chance for him to be a carrier, this number could be decreased significantly.  If his testing were to show that he is a carrier, then the number would be increased. Testing was drawn on Melanie Strong today to determine his status.  We also talked about the possibility of testing at the time of delivery. Beginning in 2021, all newborn babies in [place] are test for SMA as well as numerous other conditions as part of state mandated newborn screening. Testing on cord blood or of the baby sometime after delivery could also be ordered if desired.  We also obtained a detailed family history and pregnancy history.  This is the second pregnancy for this  couple.  They had one early miscarriage.  She reported no complications in this pregnancy and exposure to tobacco. In the family, the patient has a sister with sickle cell disease.  Given the patient's carrier screening, she does not have sickle cell trait.  See above regarding hemoglobinopathy results.  She also reported a significant family history of cancer in her father's family.  Her father has rectal cancer, one paternal uncle and paternal first cousin passed away due to stomach cancer and one paternal aunt has brain cancer, all in their 6130s-50s. We suggested that because there are families with an inherited cancer predisposition, a cancer genetic counseling visit may be appropriate for this family.  We suggested that her father ask his oncologist about this option or we are happy to provide contact information in the future.  The patient's mother, maternal aunt and first cousin all have Lupus.  The precise cause for  Lupus is not well understood, though there may be both inherited as well as environmental factors involved.  We encouraged her to make her doctor aware of this history.  The remainder of the family history was unremarkable for birth defects, intellectual disabilities, muscle weakness conditions, early death or known genetic conditions.  Of note, Ms. Luepke stated that she is taking iron supplementation due to "low iron." A review of her labs shows a fairly low MCV, low MCH and low hemoglobin.  All of these findings are known to be consistent with individuals who are carriers for beta thalassemia.  We would recommend iron studies to be performed to make sure that she is truly iron deficient.  If iron studies are normal, supplementation should be discontinued to avoid iron overload.  The abnormal findings on CBC are most likely the result of her carrier status and additonal iron will not be helpful.   Thank you for allowing [place] to be involved in the care of this patient.  We encouraged her to call  with any questions or concerns as they arise.  We may be reached at [phone].  Plan of care: Blood was drawn on Melanie Strong July for Melanie Strong carrier screening today. I will call the patient with results in approximately 2 weeks. Recommend ferritin studies to determine if patient is iron deficient.  If not, discontinue iron supplementation. If Melanie Riddle is a carrier for any hemoglobinopathy, additional testing on the pregnancy could be considered.  The patient expressed that she would likely prefer to wait until after birth for follow up testing on the baby if needed.  Melanie Anderson, MS, CGC

## 2021-05-02 ENCOUNTER — Encounter: Payer: Self-pay | Admitting: Obstetrics

## 2021-05-02 ENCOUNTER — Ambulatory Visit: Payer: BC Managed Care – PPO | Admitting: *Deleted

## 2021-05-02 ENCOUNTER — Ambulatory Visit: Payer: BC Managed Care – PPO | Attending: Maternal & Fetal Medicine

## 2021-05-02 ENCOUNTER — Encounter: Payer: Self-pay | Admitting: *Deleted

## 2021-05-02 ENCOUNTER — Other Ambulatory Visit: Payer: Self-pay

## 2021-05-02 ENCOUNTER — Ambulatory Visit (INDEPENDENT_AMBULATORY_CARE_PROVIDER_SITE_OTHER): Payer: BC Managed Care – PPO | Admitting: Obstetrics

## 2021-05-02 VITALS — BP 114/68 | HR 95 | Wt 159.0 lb

## 2021-05-02 VITALS — BP 119/57 | HR 87

## 2021-05-02 DIAGNOSIS — Z3A23 23 weeks gestation of pregnancy: Secondary | ICD-10-CM | POA: Diagnosis not present

## 2021-05-02 DIAGNOSIS — Z3689 Encounter for other specified antenatal screening: Secondary | ICD-10-CM | POA: Diagnosis present

## 2021-05-02 DIAGNOSIS — Z148 Genetic carrier of other disease: Secondary | ICD-10-CM

## 2021-05-02 DIAGNOSIS — D563 Thalassemia minor: Secondary | ICD-10-CM

## 2021-05-02 DIAGNOSIS — Z362 Encounter for other antenatal screening follow-up: Secondary | ICD-10-CM | POA: Diagnosis present

## 2021-05-02 DIAGNOSIS — Z3480 Encounter for supervision of other normal pregnancy, unspecified trimester: Secondary | ICD-10-CM

## 2021-05-02 DIAGNOSIS — O99011 Anemia complicating pregnancy, first trimester: Secondary | ICD-10-CM

## 2021-05-02 NOTE — Progress Notes (Signed)
Subjective:  Melanie Strong is a 35 y.o. G2P0010 at [redacted]w[redacted]d being seen today for ongoing prenatal care.  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  Patient reports  swelling of hands, and just doesn't feel with lightheadedness and feeling faint at times .  Contractions: Not present. Vag. Bleeding: None.  Movement: Present. Denies leaking of fluid.   The following portions of the patient's history were reviewed and updated as appropriate: allergies, current medications, past family history, past medical history, past social history, past surgical history and problem list. Problem list updated.  Objective:   Vitals:   05/02/21 1336  BP: 114/68  Pulse: 95  Weight: 159 lb (72.1 kg)    Fetal Status:     Movement: Present     General:  Alert, oriented and cooperative. Patient is in no acute distress.  Skin: Skin is warm and dry. No rash noted.   Cardiovascular: Normal heart rate noted  Respiratory: Normal respiratory effort, no problems with respiration noted  Abdomen: Soft, gravid, appropriate for gestational age. Pain/Pressure: Absent     Pelvic:  Cervical exam deferred        Extremities: Normal range of motion.  Edema: Trace  Mental Status: Normal mood and affect. Normal behavior. Normal judgment and thought content.   Urinalysis:      Assessment and Plan:  Pregnancy: G2P0010 at [redacted]w[redacted]d  1. Supervision of other normal pregnancy, antepartum  2. Anemia affecting pregnancy in first trimester  3. Carrier of spinal muscular atrophy  4. Alpha thalassemia silent carrier  5. Carrier of beta thalassemia    Preterm labor symptoms and general obstetric precautions including but not limited to vaginal bleeding,  contractions, leaking of fluid and fetal movement were reviewed in detail with the patient. Please refer to After Visit Summary for other counseling recommendations.   Return in about 4 weeks (around 05/30/2021) for ROB, 2 hour OGTT.   Brock Bad, MD  05/02/21

## 2021-05-02 NOTE — Progress Notes (Signed)
Pt is having some swelling- mostly in hands.  Pt states she has been feeling like she has felt faint since last week, hasn't worked since then.

## 2021-05-09 ENCOUNTER — Other Ambulatory Visit: Payer: Self-pay

## 2021-05-09 ENCOUNTER — Inpatient Hospital Stay (HOSPITAL_COMMUNITY)
Admission: AD | Admit: 2021-05-09 | Discharge: 2021-05-09 | Disposition: A | Discharge status: Home / Self Care | Payer: BC Managed Care – PPO | Attending: Obstetrics and Gynecology | Admitting: Obstetrics and Gynecology

## 2021-05-09 ENCOUNTER — Encounter (HOSPITAL_COMMUNITY): Payer: Self-pay | Admitting: Obstetrics and Gynecology

## 2021-05-09 ENCOUNTER — Inpatient Hospital Stay (HOSPITAL_COMMUNITY): Payer: BC Managed Care – PPO

## 2021-05-09 ENCOUNTER — Telehealth: Payer: Self-pay | Admitting: Obstetrics and Gynecology

## 2021-05-09 DIAGNOSIS — J988 Other specified respiratory disorders: Secondary | ICD-10-CM | POA: Diagnosis not present

## 2021-05-09 DIAGNOSIS — O99891 Other specified diseases and conditions complicating pregnancy: Secondary | ICD-10-CM | POA: Diagnosis not present

## 2021-05-09 DIAGNOSIS — Z3A24 24 weeks gestation of pregnancy: Secondary | ICD-10-CM | POA: Diagnosis not present

## 2021-05-09 DIAGNOSIS — R0602 Shortness of breath: Secondary | ICD-10-CM | POA: Diagnosis present

## 2021-05-09 DIAGNOSIS — O99513 Diseases of the respiratory system complicating pregnancy, third trimester: Secondary | ICD-10-CM

## 2021-05-09 DIAGNOSIS — B338 Other specified viral diseases: Secondary | ICD-10-CM | POA: Diagnosis not present

## 2021-05-09 DIAGNOSIS — O99332 Smoking (tobacco) complicating pregnancy, second trimester: Secondary | ICD-10-CM | POA: Insufficient documentation

## 2021-05-09 DIAGNOSIS — O98512 Other viral diseases complicating pregnancy, second trimester: Secondary | ICD-10-CM | POA: Insufficient documentation

## 2021-05-09 DIAGNOSIS — B9789 Other viral agents as the cause of diseases classified elsewhere: Secondary | ICD-10-CM

## 2021-05-09 DIAGNOSIS — Z20822 Contact with and (suspected) exposure to covid-19: Secondary | ICD-10-CM | POA: Insufficient documentation

## 2021-05-09 DIAGNOSIS — F17210 Nicotine dependence, cigarettes, uncomplicated: Secondary | ICD-10-CM | POA: Insufficient documentation

## 2021-05-09 DIAGNOSIS — Z3A34 34 weeks gestation of pregnancy: Secondary | ICD-10-CM

## 2021-05-09 HISTORY — DX: Anemia, unspecified: D64.9

## 2021-05-09 LAB — COMPREHENSIVE METABOLIC PANEL
ALT: 13 U/L (ref 0–44)
AST: 17 U/L (ref 15–41)
Albumin: 2.9 g/dL — ABNORMAL LOW (ref 3.5–5.0)
Alkaline Phosphatase: 59 U/L (ref 38–126)
Anion gap: 8 (ref 5–15)
BUN: 5 mg/dL — ABNORMAL LOW (ref 6–20)
CO2: 21 mmol/L — ABNORMAL LOW (ref 22–32)
Calcium: 9.1 mg/dL (ref 8.9–10.3)
Chloride: 106 mmol/L (ref 98–111)
Creatinine, Ser: 0.51 mg/dL (ref 0.44–1.00)
GFR, Estimated: >60 mL/min (ref >60)
Glucose, Bld: 102 mg/dL — ABNORMAL HIGH (ref 70–99)
Potassium: 3.6 mmol/L (ref 3.5–5.1)
Sodium: 135 mmol/L (ref 135–145)
Total Bilirubin: 0.2 mg/dL — ABNORMAL LOW (ref 0.3–1.2)
Total Protein: 6.3 g/dL — ABNORMAL LOW (ref 6.5–8.1)

## 2021-05-09 LAB — CBC WITH DIFFERENTIAL/PLATELET
Abs Immature Granulocytes: 0.22 10*3/uL — ABNORMAL HIGH (ref 0.00–0.07)
Basophils Absolute: 0 10*3/uL (ref 0.0–0.1)
Basophils Relative: 0 %
Eosinophils Absolute: 0.1 10*3/uL (ref 0.0–0.5)
Eosinophils Relative: 1 %
HCT: 29.1 % — ABNORMAL LOW (ref 36.0–46.0)
Hemoglobin: 9.4 g/dL — ABNORMAL LOW (ref 12.0–15.0)
Immature Granulocytes: 2 %
Lymphocytes Relative: 13 %
Lymphs Abs: 1.3 10*3/uL (ref 0.7–4.0)
MCH: 27.5 pg (ref 26.0–34.0)
MCHC: 32.3 g/dL (ref 30.0–36.0)
MCV: 85.1 fL (ref 80.0–100.0)
Monocytes Absolute: 0.9 10*3/uL (ref 0.1–1.0)
Monocytes Relative: 10 %
Neutro Abs: 7.2 10*3/uL (ref 1.7–7.7)
Neutrophils Relative %: 74 %
Platelets: 214 10*3/uL (ref 150–400)
RBC: 3.42 MIL/uL — ABNORMAL LOW (ref 3.87–5.11)
RDW: 16.2 % — ABNORMAL HIGH (ref 11.5–15.5)
WBC: 9.8 10*3/uL (ref 4.0–10.5)
nRBC: 0 % (ref 0.0–0.2)

## 2021-05-09 LAB — TROPONIN I (HIGH SENSITIVITY): Troponin I (High Sensitivity): 4 ng/L (ref <18)

## 2021-05-09 LAB — RESP PANEL BY RT-PCR (FLU A&B, COVID) ARPGX2
Influenza A by PCR: NEGATIVE
Influenza B by PCR: NEGATIVE
SARS Coronavirus 2 by RT PCR: NEGATIVE

## 2021-05-09 LAB — BRAIN NATRIURETIC PEPTIDE: B Natriuretic Peptide: 29.4 pg/mL (ref 0.0–100.0)

## 2021-05-09 NOTE — Discharge Instructions (Signed)

## 2021-05-09 NOTE — MAU Provider Note (Addendum)
History     CSN: [id number]  Arrival date and time: 05/09/21 1540   Event Date/Time   First Provider Initiated Contact with Patient 05/09/21 1003      Chief Complaint  Patient presents with   Shortness of Breath   Sore Throat    Melanie Strong is a 35 y.o. female G2P0010 @ 55w1dwith PMH significant for anemia, miscarriage who presents with 1 day history of chills, congestion, shortness of breath, and cough. She states the symptoms came on suddenly yesterday. She has nose, throat, and chest congestion, with a cough that's non-productive. Mild sore throat she relates to cough. Feels like she can't take deep breaths and is breathing "differently". Endorses some dizziness when standing up but this has been for past 3 weeks and she has mentioned to her primary doctor. Her doctor believes this is related to anemia and she takes PO iron every other day. No fevers at home. Took at home covid test yesterday that was negative. Denies headache, vision changes, chest pain, hemoptysis, abd pain, n/v/d, vaginal bleeding or pain. Her husband was sick last week with similar symptoms and had a negative home covid test.    OB History     Gravida  2   Para      Term      Preterm      AB  1   Living         SAB  1   IAB      Ectopic      Multiple      Live Births              Past Medical History:  Diagnosis Date   Anemia    Glucose found in urine on examination    Infection    UTI    Past Surgical History:  Procedure Laterality Date   NO PAST SURGERIES      Family History  Problem Relation Age of Onset   Hypertension Mother    Diabetes Mother        Boarderline   Lupus Mother    Cancer Paternal Aunt        brain   Cancer Father        rectal   Sickle cell anemia Sister    Healthy Brother    Healthy Brother    Other Neg Hx     Social History   Tobacco Use   Smoking status: Every Day    Packs/day: 0.50    Years: 10.00    Pack years: 5.00    Types:  Cigarettes   Smokeless tobacco: Never  Vaping Use   Vaping Use: Never used  Substance Use Topics   Alcohol use: No    Comment: rare   Drug use: No    Allergies: No Known Allergies  Medications Prior to Admission  Medication Sig Dispense Refill Last Dose   aspirin EC 81 MG tablet Take 1 tablet (81 mg total) by mouth daily. Swallow whole. 30 tablet 6 05/09/2021   docusate sodium (COLACE) 100 MG capsule Take 1 capsule (100 mg total) by mouth 2 (two) times daily. 60 capsule 6 Past Week   ferrous sulfate (FERROUSUL) 325 (65 FE) MG tablet Take 1 tablet (325 mg total) by mouth every other day. 30 tablet 2 05/09/2021   metoCLOPramide (REGLAN) 10 MG tablet Take 1 tablet (10 mg total) by mouth 3 (three) times daily with meals as needed for nausea. 30 tablet 3 Past Week  Prenatal Vit-Fe Fumarate-FA (PRENATAL MULTIVITAMIN) TABS tablet Take 1 tablet by mouth daily at 12 noon.   05/09/2021   promethazine (PHENERGAN) 12.5 MG tablet Take 1-2 tablets (12.5-25 mg total) by mouth at bedtime as needed for nausea or vomiting. 30 tablet 3 Past Week   acetaminophen (TYLENOL) 500 MG tablet Take 500 mg by mouth every 6 (six) hours as needed.   More than a month   Blood Pressure Monitoring (BLOOD PRESSURE KIT) DEVI 1 kit by Does not apply route as needed. Large cuff 1 each 0     Review of Systems  Constitutional:  Positive for chills. Negative for fever.  HENT:  Positive for congestion, rhinorrhea, sinus pressure and sore throat. Negative for sinus pain and trouble swallowing.   Eyes: Negative.   Respiratory:  Positive for cough and shortness of breath.   Cardiovascular: Negative.  Negative for chest pain.  Gastrointestinal: Negative.  Negative for abdominal pain, diarrhea, nausea and vomiting.  Endocrine: Negative.   Genitourinary: Negative.  Negative for pelvic pain, vaginal bleeding, vaginal discharge and vaginal pain.  Musculoskeletal: Negative.   Skin: Negative.   Allergic/Immunologic: Negative.    Neurological:  Positive for dizziness. Negative for weakness and headaches.       Dizziness upon standing  Hematological: Negative.   Psychiatric/Behavioral: Negative.    All other systems reviewed and are negative.  Physical Exam   Blood pressure 132/75, pulse 94, temperature 98.8 F (37.1 C), temperature source Oral, resp. rate 18, weight 73.8 kg, last menstrual period 11/21/2020, SpO2 100 %, unknown if currently breastfeeding.  Physical Exam Vitals and nursing note reviewed.  Constitutional:      General: She is not in acute distress.    Appearance: She is well-developed.  HENT:     Head: Normocephalic and atraumatic.     Nose: Rhinorrhea present.     Mouth/Throat:     Pharynx: No pharyngeal swelling or oropharyngeal exudate.  Cardiovascular:     Rate and Rhythm: Normal rate and regular rhythm.     Pulses: Normal pulses.     Heart sounds: No murmur heard.   No friction rub. No gallop.  Pulmonary:     Effort: Pulmonary effort is normal.     Breath sounds: Normal breath sounds. No wheezing, rhonchi or rales.  Abdominal:     Tenderness: There is no abdominal tenderness.     Comments: gravid  Musculoskeletal:     Cervical back: Neck supple.     Right lower leg: No edema.     Left lower leg: No edema.  Skin:    General: Skin is warm and dry.  Neurological:     Mental Status: She is alert and oriented to person, place, and time.     Motor: No weakness.  Psychiatric:        Mood and Affect: Mood normal.        Behavior: Behavior normal.     MAU Course  Procedures Results for orders placed or performed during the hospital encounter of 05/09/21 (from the past 24 hour(s))  Resp Panel by RT-PCR (Flu A&B, Covid) Nasopharyngeal Swab     Status: None   Collection Time: 05/09/21 10:20 AM   Specimen: Nasopharyngeal Swab; Nasopharyngeal(NP) swabs in vial transport medium  Result Value Ref Range   SARS Coronavirus 2 by RT PCR NEGATIVE NEGATIVE   Influenza A by PCR NEGATIVE  NEGATIVE   Influenza B by PCR NEGATIVE NEGATIVE  CBC with Differential/Platelet     Status: Abnormal  Collection Time: 05/09/21 10:40 AM  Result Value Ref Range   WBC 9.8 4.0 - 10.5 K/uL   RBC 3.42 (L) 3.87 - 5.11 MIL/uL   Hemoglobin 9.4 (L) 12.0 - 15.0 g/dL   HCT 29.1 (L) 36.0 - 46.0 %   MCV 85.1 80.0 - 100.0 fL   MCH 27.5 26.0 - 34.0 pg   MCHC 32.3 30.0 - 36.0 g/dL   RDW 16.2 (H) 11.5 - 15.5 %   Platelets 214 150 - 400 K/uL   nRBC 0.0 0.0 - 0.2 %   Neutrophils Relative % 74 %   Neutro Abs 7.2 1.7 - 7.7 K/uL   Lymphocytes Relative 13 %   Lymphs Abs 1.3 0.7 - 4.0 K/uL   Monocytes Relative 10 %   Monocytes Absolute 0.9 0.1 - 1.0 K/uL   Eosinophils Relative 1 %   Eosinophils Absolute 0.1 0.0 - 0.5 K/uL   Basophils Relative 0 %   Basophils Absolute 0.0 0.0 - 0.1 K/uL   Immature Granulocytes 2 %   Abs Immature Granulocytes 0.22 (H) 0.00 - 0.07 K/uL  Comprehensive metabolic panel     Status: Abnormal   Collection Time: 05/09/21 10:40 AM  Result Value Ref Range   Sodium 135 135 - 145 mmol/L   Potassium 3.6 3.5 - 5.1 mmol/L   Chloride 106 98 - 111 mmol/L   CO2 21 (L) 22 - 32 mmol/L   Glucose, Bld 102 (H) 70 - 99 mg/dL   BUN 5 (L) 6 - 20 mg/dL   Creatinine, Ser 0.51 0.44 - 1.00 mg/dL   Calcium 9.1 8.9 - 10.3 mg/dL   Total Protein 6.3 (L) 6.5 - 8.1 g/dL   Albumin 2.9 (L) 3.5 - 5.0 g/dL   AST 17 15 - 41 U/L   ALT 13 0 - 44 U/L   Alkaline Phosphatase 59 38 - 126 U/L   Total Bilirubin 0.2 (L) 0.3 - 1.2 mg/dL   GFR, Estimated >60 >60 mL/min   Anion gap 8 5 - 15  Brain natriuretic peptide     Status: None   Collection Time: 05/09/21 10:40 AM  Result Value Ref Range   B Natriuretic Peptide 29.4 0.0 - 100.0 pg/mL  Troponin I (High Sensitivity)     Status: None   Collection Time: 05/09/21 10:40 AM  Result Value Ref Range   Troponin I (High Sensitivity) 4 <18 ng/L     Assessment and Plan  -Viral respiratory infection  COVID and flu swabs negative.  CXR decreased lung volume  but no consolidation or effusion.  EKG NSR with no ST changes, trop 4. Low suspicion for cardiac etiology Plan: D/C home with symptomatic treatment and strict return precautions   Les Pou 05/09/2021, 10:21 AM    Attestation of Supervision of Student:  I confirm that I have verified the information documented in the physician assistant student's note and that I have also personally performed the history, physical exam and all medical decision making activities.  I have verified that all services and findings are accurately documented in this student's note; and I agree with management and plan as outlined in the documentation. I have also made any necessary editorial changes.  History Melanie Strong is a 35 y.o. G2P0010 at 69w1dwho presents with congestion, sore throat, cough, and shortness of breath. Symptoms started on Sunday. Spouse has similar symptoms. They have both tested negative for covid at home. Has not had fever. Has taken flonase & benadryl for her symptoms without  relief. Denies fever, abdominal pain, n/v/d, vaginal bleeding.   Physical exam BP (!) 111/59 (BP Location: Left Arm)   Pulse 88   Temp 98 F (36.7 C) (Oral)   Resp 18   Wt 73.8 kg   LMP 11/21/2020   SpO2 100% Comment: room air  BMI 27.91 kg/m   Physical Examination: General appearance - alert, well appearing, and in no distress Mental status - normal mood, behavior, speech, dress, motor activity, and thought processes Eyes - sclera anicteric Mouth - mucous membranes moist, pharynx normal without lesions Chest - clear to auscultation, no wheezes, rales or rhonchi, symmetric air entry Heart - normal rate and regular rhythm  NST:  Baseline: 155 bpm, Variability: Good {> 6 bpm), Accelerations: Non-reactive but appropriate for gestational age, and Decelerations: Absent   Assessment 1. Viral respiratory illness   2. Shortness of breath   3. [redacted] weeks gestation of pregnancy      Plan Discharge home in  stable condition Reviewed OTC meds safe to take - discussed symptomatic treatment Reviewed reasons to return to [place], [place] for [employer], [place] 05/09/2021 6:18 PM

## 2021-05-09 NOTE — Telephone Encounter (Signed)
I spoke with Ms. Henkin and her husband, Monti Villers (dob 09/21/1981) to provider his carrier screening results.  The [employer] carrier screening panel was negative.  This testing screens for genetic variants in the Hemoglobin A and B genes as well as for cystic fibrosis and spinal muscular atrophy.  His testing was ordered because Ms. 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  Because his testing was negative for the genes HBA1/HBA2 and HBB, this pregnancy is not at risk for having either alpha or beta thalassemia.  His negative results for CF and SMA will greatly reduce, but cannot eliminate, the chance to have a child with these conditions.  The chance for either of these conditions in this pregnancy is less than 1 in 10,000. No additional testing is recommended at this time.  Cherly Anderson, MS, CGC

## 2021-05-09 NOTE — MAU Note (Signed)
Presents stating she's SOB, has a sore throat, nasal congestion, and cough.  Reports symptoms started on Sunday and had progressively worsened.  Took @ home Covid test, reports test negative.  Denies LOF or VB.  Endorses +FM.

## 2021-05-30 ENCOUNTER — Ambulatory Visit (INDEPENDENT_AMBULATORY_CARE_PROVIDER_SITE_OTHER): Payer: BC Managed Care – PPO | Admitting: Obstetrics

## 2021-05-30 ENCOUNTER — Encounter: Payer: Self-pay | Admitting: Obstetrics

## 2021-05-30 ENCOUNTER — Other Ambulatory Visit: Payer: BC Managed Care – PPO

## 2021-05-30 ENCOUNTER — Other Ambulatory Visit: Payer: Self-pay

## 2021-05-30 VITALS — BP 120/65 | HR 85 | Wt 172.0 lb

## 2021-05-30 DIAGNOSIS — K219 Gastro-esophageal reflux disease without esophagitis: Secondary | ICD-10-CM

## 2021-05-30 DIAGNOSIS — Z3480 Encounter for supervision of other normal pregnancy, unspecified trimester: Secondary | ICD-10-CM

## 2021-05-30 MED ORDER — PANTOPRAZOLE SODIUM 40 MG PO TBEC: 40.0000 mg | DELAYED_RELEASE_TABLET | Freq: Every day | ORAL | 5 refills | Status: DC

## 2021-05-30 NOTE — Progress Notes (Signed)
Subjective:  Melanie Strong is a 35 y.o. G2P0010 at [redacted]w[redacted]d being seen today for ongoing prenatal care.  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  Patient reports severe backache, pelvic pressure and fatigue.   Has not been able to go to work for the past 3 weeks.  Contractions: Not present.  .  Movement: Present. Denies leaking of fluid.   The following portions of the patient's history were reviewed and updated as appropriate: allergies, current medications, past family history, past medical history, past social history, past surgical history and problem list. Problem list updated.  Objective:   Vitals:   05/30/21 0840  BP: 120/65  Pulse: 85  Weight: 172 lb (78 kg)    Fetal Status:     Movement: Present     General:  Alert, oriented and cooperative. Patient is in no acute distress.  Skin: Skin is warm and dry. No rash noted.   Cardiovascular: Normal heart rate noted  Respiratory: Normal respiratory effort, no problems with respiration noted  Abdomen: Soft, gravid, appropriate for gestational age. Pain/Pressure: Absent     Pelvic:  Cervical exam deferred        Extremities: Normal range of motion.     Mental Status: Normal mood and affect. Normal behavior. Normal judgment and thought content.   Urinalysis:      Assessment and Plan:  Pregnancy: G2P0010 at [redacted]w[redacted]d  1. Supervision of other normal pregnancy, antepartum Rx: - Glucose Tolerance, 2 Hours w/1 Hour - CBC - HIV Antibody (routine testing w rflx) - RPR  2. GERD without esophagitis Rx: - pantoprazole (PROTONIX) 40 MG tablet; Take 1 tablet (40 mg total) by mouth daily.  Dispense: 30 tablet; Refill: 5   Preterm labor symptoms and general  obstetric precautions including but not limited to vaginal bleeding, contractions, leaking of fluid and fetal movement were reviewed in detail with the patient. Please refer to After Visit Summary for other counseling recommendations.   Return in about 2 weeks (around 06/13/2021) for ROB.   Brock Bad, MD  05/30/21

## 2021-05-31 ENCOUNTER — Encounter: Payer: Self-pay | Admitting: *Deleted

## 2021-05-31 ENCOUNTER — Other Ambulatory Visit: Payer: Self-pay | Admitting: Obstetrics

## 2021-05-31 DIAGNOSIS — O99011 Anemia complicating pregnancy, first trimester: Secondary | ICD-10-CM

## 2021-05-31 LAB — CBC
Hematocrit: 29.2 % — ABNORMAL LOW (ref 34.0–46.6)
Hemoglobin: 9.7 g/dL — ABNORMAL LOW (ref 11.1–15.9)
MCH: 27.8 pg (ref 26.6–33.0)
MCHC: 33.2 g/dL (ref 31.5–35.7)
MCV: 84 fL (ref 79–97)
Platelets: 205 10*3/uL (ref 150–450)
RBC: 3.49 x10E6/uL — ABNORMAL LOW (ref 3.77–5.28)
RDW: 14.8 % (ref 11.7–15.4)
WBC: 8.5 10*3/uL (ref 3.4–10.8)

## 2021-05-31 LAB — HIV ANTIBODY (ROUTINE TESTING W REFLEX): HIV Screen 4th Generation wRfx: NONREACTIVE

## 2021-05-31 LAB — RPR: RPR Ser Ql: NONREACTIVE

## 2021-05-31 LAB — GLUCOSE TOLERANCE, 2 HOURS W/ 1HR
Glucose, 1 hour: 141 mg/dL (ref 65–179)
Glucose, 2 hour: 113 mg/dL (ref 65–152)
Glucose, Fasting: 85 mg/dL (ref 65–91)

## 2021-05-31 MED ORDER — IRON POLYSACCH CMPLX-B12-FA 150-0.025-1 MG PO CAPS: 1.0000 | ORAL_CAPSULE | ORAL | 5 refills | Status: DC

## 2021-05-31 NOTE — Progress Notes (Signed)
Patient is active in MyChart. Message sent regarding anemia and Iron/ [facility]. Patient education on anemia in pregnancy included.

## 2021-06-13 ENCOUNTER — Ambulatory Visit (INDEPENDENT_AMBULATORY_CARE_PROVIDER_SITE_OTHER): Payer: BC Managed Care – PPO | Admitting: Obstetrics and Gynecology

## 2021-06-13 ENCOUNTER — Other Ambulatory Visit: Payer: Self-pay

## 2021-06-13 ENCOUNTER — Encounter: Payer: Self-pay | Admitting: Obstetrics and Gynecology

## 2021-06-13 VITALS — BP 117/63 | HR 93 | Wt 175.0 lb

## 2021-06-13 DIAGNOSIS — O99011 Anemia complicating pregnancy, first trimester: Secondary | ICD-10-CM

## 2021-06-13 DIAGNOSIS — Z3480 Encounter for supervision of other normal pregnancy, unspecified trimester: Secondary | ICD-10-CM

## 2021-06-13 DIAGNOSIS — Z3A29 29 weeks gestation of pregnancy: Secondary | ICD-10-CM

## 2021-06-13 DIAGNOSIS — O26843 Uterine size-date discrepancy, third trimester: Secondary | ICD-10-CM

## 2021-06-13 NOTE — Progress Notes (Signed)
   LOW-RISK PREGNANCY OFFICE VISIT Patient name: Melanie Strong MRN [id number]  Date of birth: 11-26-1985 Chief Complaint:   Routine Prenatal Visit  History of Present Illness:   Melanie Strong is a 35 y.o. G23P0010 female at [redacted]w[redacted]d with an Estimated Date of Delivery: 08/28/21 being seen today for ongoing management of a low-risk pregnancy.  Today she reports  ?bruises on side of both breasts . She denies any injury or pain in those areas. Contractions: Not present. Vag. Bleeding: None.  Movement: Present. denies leaking of fluid. Review of Systems:   Pertinent items are noted in HPI Denies abnormal vaginal discharge w/ itching/odor/irritation, headaches, visual changes, shortness of breath, chest pain, abdominal pain, severe nausea/vomiting, or problems with urination or bowel movements unless otherwise stated above. Pertinent History Reviewed:  Reviewed past medical,surgical, social, obstetrical and family history.  Reviewed problem list, medications and allergies. Physical Assessment:   Vitals:   06/13/21 1025  BP: 117/63  Pulse: 93  Weight: 175 lb (79.4 kg)  Body mass index is 30.04 kg/m.        Physical Examination:   General appearance: Well appearing, and in no distress  Mental status: Alert, oriented to person, place, and time  Skin: Warm & dry  Cardiovascular: Normal heart rate noted  Respiratory: Normal respiratory effort, no distress  Abdomen: Soft, gravid, nontender  Pelvic: Cervical exam deferred         Extremities: Edema: Trace  Fetal Status: Fetal Heart Rate (bpm): 147   Movement: Present    No results found for this or any previous visit (from the past 24 hour(s)).  Assessment & Plan:  1) Low-risk pregnancy G2P0010 at [redacted]w[redacted]d with an Estimated Date of Delivery: 08/28/21   2) Supervision of other normal pregnancy, antepartum - Explained that some discoloration is normal in women of color during pregnancy d/t hormonal changes  3) Uterine size-date discrepancy  in third trimester  - [place] MFM OB FOLLOW UP for growth  4) Anemia affecting pregnancy in first trimester - Taking iron  5) [redacted] weeks gestation of pregnancy    Meds: No orders of the defined types were placed in this encounter.  Labs/procedures today: none  Plan:  Continue routine obstetrical care   Reviewed: Preterm labor symptoms and general obstetric precautions including but not limited to vaginal bleeding, contractions, leaking of fluid and fetal movement were reviewed in detail with the patient.  All questions were answered. Has home bp cuff. Check bp weekly, let us know if >140/90.   Follow-up: Return in about 4 weeks (around 07/11/2021) for Return OB - My Chart video.  No orders of the defined types were placed in this encounter.  Raelyn Mora MSN, CNM 06/13/2021 10:45 AM

## 2021-06-18 ENCOUNTER — Encounter: Payer: Self-pay | Admitting: *Deleted

## 2021-06-18 ENCOUNTER — Ambulatory Visit: Payer: BC Managed Care – PPO | Attending: Obstetrics and Gynecology

## 2021-06-18 ENCOUNTER — Other Ambulatory Visit: Payer: Self-pay

## 2021-06-18 ENCOUNTER — Ambulatory Visit: Payer: BC Managed Care – PPO | Admitting: *Deleted

## 2021-06-18 VITALS — BP 127/63 | HR 91

## 2021-06-18 DIAGNOSIS — Z362 Encounter for other antenatal screening follow-up: Secondary | ICD-10-CM | POA: Diagnosis not present

## 2021-06-18 DIAGNOSIS — O26843 Uterine size-date discrepancy, third trimester: Secondary | ICD-10-CM | POA: Diagnosis not present

## 2021-06-18 DIAGNOSIS — Z3A29 29 weeks gestation of pregnancy: Secondary | ICD-10-CM | POA: Diagnosis not present

## 2021-06-26 ENCOUNTER — Telehealth: Payer: Self-pay | Admitting: *Deleted

## 2021-06-26 NOTE — Telephone Encounter (Signed)
TC regarding message from CMA about short term disability papers. No answer. VM for patient to call the office.

## 2021-06-28 ENCOUNTER — Other Ambulatory Visit: Payer: Self-pay | Admitting: *Deleted

## 2021-06-28 NOTE — Progress Notes (Signed)
Late Entry 06/27/21 Copies of office/chart/examination notes from 04/26/2021 forward scanned to e-mail for [employer] [facility] per request of patient. Work Ability/Return to Work form not completed as patient was not removed from work by Counselling psychologist. Patient aware.

## 2021-06-29 ENCOUNTER — Ambulatory Visit: Payer: Self-pay

## 2021-07-11 ENCOUNTER — Ambulatory Visit (INDEPENDENT_AMBULATORY_CARE_PROVIDER_SITE_OTHER): Payer: BC Managed Care – PPO | Admitting: Women's Health

## 2021-07-11 ENCOUNTER — Other Ambulatory Visit: Payer: Self-pay

## 2021-07-11 ENCOUNTER — Encounter: Payer: Self-pay | Admitting: Women's Health

## 2021-07-11 VITALS — BP 128/78 | HR 90 | Wt 183.2 lb

## 2021-07-11 DIAGNOSIS — A6000 Herpesviral infection of urogenital system, unspecified: Secondary | ICD-10-CM | POA: Insufficient documentation

## 2021-07-11 DIAGNOSIS — O99011 Anemia complicating pregnancy, first trimester: Secondary | ICD-10-CM

## 2021-07-11 DIAGNOSIS — Z3480 Encounter for supervision of other normal pregnancy, unspecified trimester: Secondary | ICD-10-CM

## 2021-07-11 DIAGNOSIS — D563 Thalassemia minor: Secondary | ICD-10-CM

## 2021-07-11 DIAGNOSIS — Z148 Genetic carrier of other disease: Secondary | ICD-10-CM

## 2021-07-11 DIAGNOSIS — Z3A33 33 weeks gestation of pregnancy: Secondary | ICD-10-CM

## 2021-07-11 HISTORY — DX: Herpesviral infection of urogenital system, unspecified: A60.00

## 2021-07-11 MED ORDER — VALACYCLOVIR HCL 500 MG PO TABS: 500.0000 mg | ORAL_TABLET | Freq: Two times a day (BID) | ORAL | 2 refills | Status: DC

## 2021-07-11 NOTE — Addendum Note (Signed)
Addended by: Dalphine Handing on: 07/11/2021 09:40 AM   Modules accepted: Orders

## 2021-07-11 NOTE — Patient Instructions (Addendum)
Maternity Assessment Unit (MAU)  The Maternity Assessment Unit (MAU) is located at the [facility]. The address is: [address], [address], [place], [place] [address]. Please see map below for additional directions.    The Maternity Assessment Unit is designed to help you during your pregnancy, and for up to 6 weeks after delivery, with any pregnancy- or postpartum-related emergencies, if you think you are in labor, or if your water has broken. For example, if you experience nausea and vomiting, vaginal bleeding, severe abdominal or pelvic pain, elevated blood pressure or other problems related to your pregnancy or postpartum time, please come to the Maternity Assessment Unit for assistance.       Preterm Labor The normal length of a pregnancy is 39-41 weeks. Preterm labor is when labor starts before 37 completed weeks of pregnancy. Babies who are born prematurely and survive may not be fully developed and may be at an increased risk for long-term problems such as cerebral palsy, developmental delays, and vision and hearing problems. Babies who are born too early may have problems soon after birth. Premature babies may have problems regulating blood sugar, body temperature, heart rate, and breathing rate. These babies often have trouble with feeding. The risk of having problems is highest for babies who are born before 34 weeks of pregnancy. What are the causes? The exact cause of this condition is not known. What increases the risk? You are more likely to have preterm labor if you have certain risk factors that relate to your medical history, problems with present and past pregnancies, and lifestyle factors. Medical history You have abnormalities of the uterus, including a short cervix. You have STIs (sexually transmitted infections) or other infections of the urinary tract and the vagina. You have chronic illnesses, such as blood clotting  problems, diabetes, or high blood pressure. You are overweight or underweight. Present and past pregnancies You have had preterm labor before. You are pregnant with twins or other multiples. You have been diagnosed with a condition in which the placenta covers your cervix (placenta previa). You waited less than 18 months between giving birth and becoming pregnant again. Your unborn baby has some abnormalities. You have vaginal bleeding during pregnancy. You became pregnant through in vitro fertilization (IVF). Lifestyle and environmental factors You use tobacco products or drink alcohol. You use drugs. You have stress and no social support. You experience domestic violence. You are exposed to certain chemicals or environmental pollutants. Other factors You are younger than age 17 or older than age 35. What are the signs or symptoms? Symptoms of this condition include: Cramps similar to those that can happen during a menstrual period. The cramps may happen with diarrhea. Pain in the abdomen or lower back. Regular contractions that may feel like tightening of the abdomen. A feeling of increased pressure in the pelvis. Increased watery or bloody mucus discharge from the vagina. Water breaking (ruptured amniotic sac). How is this diagnosed? This condition is diagnosed based on: Your medical history and a physical exam. A pelvic exam. An ultrasound. Monitoring your uterus for contractions. Other tests, including: A swab of the cervix to check for a chemical called fetal fibronectin. Urine tests. How is this treated? Treatment for this condition depends on the length of your pregnancy, your condition, and the health of your baby. Treatment may include: Taking medicines, such as: Hormone medicines. These may be given early in pregnancy to help support the pregnancy. Medicines to stop   contractions. Medicines to help mature the baby's lungs. These may be prescribed if the risk of  delivery is high. Medicines to help protect your baby from brain and nerve complications such as cerebral palsy. Bed rest. If the labor happens before 34 weeks of pregnancy, you may need to stay in the hospital. Delivery of the baby. Follow these instructions at home:  Do not use any products that contain nicotine or tobacco. These products include cigarettes, chewing tobacco, and vaping devices, such as e-cigarettes. If you need help quitting, ask your health care provider. Do not drink alcohol. Take over-the-counter and prescription medicines only as told by your health care provider. Rest as told by your health care provider. Return to your normal activities as told by your health care provider. Ask your health care provider what activities are safe for you. Keep all follow-up visits. This is important. How is this prevented? To increase your chance of having a full-term pregnancy: Do not use drugs or take medicines that have not been prescribed to you during your pregnancy. Talk with your health care provider before taking any herbal supplements, even if you have been taking them regularly. Make sure you gain a healthy amount of weight during your pregnancy. Watch for infection. If you think that you might have an infection, get it checked right away. Symptoms of infection may include: Fever. Abnormal vaginal discharge or discharge that smells bad. Pain or burning with urination. Needing to urinate urgently. Frequently urinating or passing small amounts of urine frequently. Blood in your urine or urine that smells bad or unusual. Where to find more information [place] Department of Health and Cytogeneticist on Women's Health: http://hoffman.com/ [employer]: www.acog.org Centers for Disease Control and Prevention, Preterm Birth: FootballExhibition.com.br Contact a health care provider if: You think you are going into preterm labor. You have signs  or symptoms of preterm labor. You have symptoms of infection. Get help right away if: You are having regular, painful contractions every 5 minutes or less. Your water breaks. Summary Preterm labor is labor that starts before you reach 37 weeks of pregnancy. Delivering your baby early increases your baby's risk of developing long-term problems. You are more likely to have preterm labor if you have certain risk factors that relate to your medical history, problems with present and past pregnancies, and lifestyle factors. Keep all follow-up visits. This is important. Contact a health care provider if you have signs or symptoms of preterm labor. This information is not intended to replace advice given to you by your health care provider. Make sure you discuss any questions you have with your health care provider. Document Revised: 08/22/2020 Document Reviewed: 08/22/2020 Elsevier Patient Education  2022 Elsevier Inc.       Pregnancy and Anemia Anemia is a condition in which there is not enough red blood cells or hemoglobin in the blood. Hemoglobin is a substance in red blood cells that carries oxygen. When you do not have enough red blood cells or hemoglobin (are anemic), your body cannot get enough oxygen and your organs may not work properly. Anemia is common during pregnancy because your body needs more blood volume and blood cells to provide nutrition to the unborn baby. What are the causes? The most common cause of anemia during pregnancy is not having enough iron in the body to make red blood cells (iron deficiency anemia). Other causes may include: Folic acid deficiency. Vitamin B12 deficiency. Certain prescription or over-the-counter medicines.  Certain medical conditions or infections that destroy red blood cells. A low platelet count and bleeding caused by antibodies that go through the placenta to the baby from the mother's blood. What are the signs or symptoms? Mild anemia may  not cause any symptoms. If anemia becomes severe, symptoms may include: Feeling tired or weak. Shortness of breath, especially during activity. Fainting. Pale skin. Headaches. A fast or irregular heartbeat. Dizziness. How is this diagnosed? This condition may be diagnosed based on your medical history and a physical exam. You may also have blood tests. How is this treated? Treatment for anemia during pregnancy depends on the cause of the anemia. Treatment may include: Making changes to your diet. Taking iron, vitamin B12, or folic acid supplements. Having a blood transfusion. This may be needed if the anemia is severe. Follow these instructions at home: Eating and drinking Follow recommendations from your health care provider about changing your diet. Eat a diet rich in iron. This would include foods such as: Liver. Beef. Eggs. Whole grains. Spinach. Dried fruit. Increase your vitamin C intake. This will help the stomach absorb more iron. Some foods that are high in vitamin C include: Oranges. Peppers. Tomatoes. Mangoes. Eat green leafy vegetables. These are a good source of folic acid. General instructions Take iron supplements and vitamins as told by your health care provider. Keep all follow-up visits. This is important. Contact a health care provider if: You have headaches that happen often or do not go away. You bruise easily. You have a fever. You have nausea and vomiting for more than 24 hours. You are unable to take supplements prescribed to treat your anemia. Get help right away if: You develop signs or symptoms of severe anemia. You have bleeding from your vagina. You develop a rash. You have bloody or tarry stools. You are very dizzy or you faint. Summary Anemia is a condition in which there is not enough red blood cells or hemoglobin in the blood. The most common cause of anemia during pregnancy is not having enough iron in the body to make red blood cells  (iron deficiency anemia). Mild anemia may not cause any symptoms. If it becomes severe, symptoms may include feeling tired and weak. Take iron supplements and vitamins as told by your health care provider. Keep all follow-up visits. This is important. This information is not intended to replace advice given to you by your health care provider. Make sure you discuss any questions you have with your health care provider. Document Revised: 12/21/2019 Document Reviewed: 12/21/2019 Elsevier Patient Education  2022 [employer].       Third Trimester of Pregnancy The third trimester of pregnancy is from week 28 through week 40. This is months 7 through 9. The third trimester is a time when the unborn baby (fetus) is growing rapidly. At the end of the ninth month, the fetus is about 20 inches long and weighs 6-10 pounds. Body changes during your third trimester During the third trimester, your body will continue to go through many changes. The changes vary and generally return to normal after your baby is born. Physical changes Your weight will continue to increase. You can expect to gain 25-35 pounds (11-16 kg) by the end of the pregnancy if you begin pregnancy at a normal weight. If you are underweight, you can expect to gain 28-40 lb (about 13-18 kg), and if you are overweight, you can expect to gain 15-25 lb (about 7-11 kg). You may begin to get  stretch marks on your hips, abdomen, and breasts. Your breasts will continue to grow and may hurt. A yellow fluid (colostrum) may leak from your breasts. This is the first milk you are producing for your baby. You may have changes in your hair. These can include thickening of your hair, rapid growth, and changes in texture. Some people also have hair loss during or after pregnancy, or hair that feels dry or thin. Your belly button may stick out. You may notice more swelling in your hands, face, or ankles. Health changes You may have heartburn. You may  have constipation. You may develop hemorrhoids. You may develop swollen, bulging veins (varicose veins) in your legs. You may have increased body aches in the pelvis, back, or thighs. This is due to weight gain and increased hormones that are relaxing your joints. You may have increased tingling or numbness in your hands, arms, and legs. The skin on your abdomen may also feel numb. You may feel short of breath because of your expanding uterus. Other changes You may urinate more often because the fetus is moving lower into your pelvis and pressing on your bladder. You may have more problems sleeping. This may be caused by the size of your abdomen, an increased need to urinate, and an increase in your body's metabolism. You may notice the fetus "dropping," or moving lower in your abdomen (lightening). You may have increased vaginal discharge. You may notice that you have pain around your pelvic bone as your uterus distends. Follow these instructions at home: Medicines Follow your health care provider's instructions regarding medicine use. Specific medicines may be either safe or unsafe to take during pregnancy. Do not take any medicines unless approved by your health care provider. Take a prenatal vitamin that contains at least 600 micrograms (mcg) of folic acid. Eating and drinking Eat a healthy diet that includes fresh fruits and vegetables, whole grains, good sources of protein such as meat, eggs, or tofu, and low-fat dairy products. Avoid raw meat and unpasteurized juice, milk, and cheese. These carry germs that can harm you and your baby. Eat 4 or 5 small meals rather than 3 large meals a day. You may need to take these actions to prevent or treat constipation: Drink enough fluid to keep your urine pale yellow. Eat foods that are high in fiber, such as beans, whole grains, and fresh fruits and vegetables. Limit foods that are high in fat and processed sugars, such as fried or sweet  foods. Activity Exercise only as directed by your health care provider. Most people can continue their usual exercise routine during pregnancy. Try to exercise for 30 minutes at least 5 days a week. Stop exercising if you experience contractions in the uterus. Stop exercising if you develop pain or cramping in the lower abdomen or lower back. Avoid heavy lifting. Do not exercise if it is very hot or humid or if you are at a high altitude. If you choose to, you may continue to have sex unless your health care provider tells you not to. Relieving pain and discomfort Take frequent breaks and rest with your legs raised (elevated) if you have leg cramps or low back pain. Take warm sitz baths to soothe any pain or discomfort caused by hemorrhoids. Use hemorrhoid cream if your health care provider approves. Wear a supportive bra to prevent discomfort from breast tenderness. If you develop varicose veins: Wear support hose as told by your health care provider. Elevate your feet for 15 minutes,  3-4 times a day. Limit salt in your diet. Safety Talk to your health care provider before traveling far distances. Do not use hot tubs, steam rooms, or saunas. Wear your seat belt at all times when driving or riding in a car. Talk with your health care provider if someone is verbally or physically abusive to you. Preparing for birth To prepare for the arrival of your baby: Take prenatal classes to understand, practice, and ask questions about labor and delivery. Visit the hospital and tour the maternity area. Purchase a rear-facing car seat and make sure you know how to install it in your car. Prepare the baby's room or sleeping area. Make sure to remove all pillows and stuffed animals from the baby's crib to prevent suffocation. General instructions Avoid cat litter boxes and soil used by cats. These carry germs that can cause birth defects in the baby. If you have a cat, ask someone to clean the litter box  for you. Do not douche or use tampons. Do not use scented sanitary pads. Do not use any products that contain nicotine or tobacco, such as cigarettes, e-cigarettes, and chewing tobacco. If you need help quitting, ask your health care provider. Do not use any herbal remedies, illegal drugs, or medicines that were not prescribed to you. Chemicals in these products can harm your baby. Do not drink alcohol. You will have more frequent prenatal exams during the third trimester. During a routine prenatal visit, your health care provider will do a physical exam, perform tests, and discuss your overall health. Keep all follow-up visits. This is important. Where to find more information American Pregnancy Association: americanpregnancy.org [employer]: https://www.todd-brady.net/ Office on [employer] Health: MightyReward.co.nz Contact a health care provider if you have: A fever. Mild pelvic cramps, pelvic pressure, or nagging pain in your abdominal area or lower back. Vomiting or diarrhea. Bad-smelling vaginal discharge or foul-smelling urine. Pain when you urinate. A headache that does not go away when you take medicine. Visual changes or see spots in front of your eyes. Get help right away if: Your water breaks. You have regular contractions less than 5 minutes apart. You have spotting or bleeding from your vagina. You have severe abdominal pain. You have difficulty breathing. You have chest pain. You have fainting spells. You have not felt your baby move for the time period told by your health care provider. You have new or increased pain, swelling, or redness in an arm or leg. Summary The third trimester of pregnancy is from week 28 through week 40 (months 7 through 9). You may have more problems sleeping. This can be caused by the size of your abdomen, an increased need to urinate, and an increase in your body's metabolism. You will  have more frequent prenatal exams during the third trimester. Keep all follow-up visits. This is important. This information is not intended to replace advice given to you by your health care provider. Make sure you discuss any questions you have with your health care provider. Document Revised: 01/26/2020 Document Reviewed: 12/02/2019 Elsevier Patient Education  2022 Elsevier Inc.       Group B Streptococcus Infection During Pregnancy Group B Streptococcus (GBS) is a type of bacteria that is often found in healthy people. It is commonly found in the rectum, vagina, and intestines. In people who are healthy and not pregnant, the bacteria rarely cause serious illness or complications. However, women who test positive for GBS during pregnancy can pass the bacteria to  the baby during childbirth. This can cause serious infection in the baby after birth. Women with GBS may also have infections during their pregnancy or soon after childbirth. The infections include urinary tract infections (UTIs) or infections of the uterus. GBS also increases a woman's risk of complications during pregnancy, such as early labor or delivery, miscarriage, or stillbirth. Routine testing for GBS is recommended for all pregnant women. What are the causes? This condition is caused by bacteria called Streptococcus agalactiae. What increases the risk? You may have a higher risk for GBS infection during pregnancy if you had one during a past pregnancy. What are the signs or symptoms? In most cases, GBS infection does not cause symptoms in pregnant women. If symptoms exist, they may include: Labor that starts before the 37th week of pregnancy. A UTI or bladder infection. This may cause a fever, frequent urination, or pain and burning during urination. Fever during labor. There can also be a rapid heartbeat in the mother or baby. Rare but serious symptoms of a GBS infection in women include: Blood infection (septicemia).  This may cause fever, chills, or confusion. Lung infection (pneumonia). This may cause fever, chills, cough, rapid breathing, chest pain, or difficulty breathing. Bone, joint, skin, or soft tissue infection. How is this diagnosed? You may be screened for GBS between week 35 and week 37 of pregnancy. If you have symptoms of preterm labor, you may be screened earlier. This condition is diagnosed based on lab test results from: A swab of fluid from the vagina and rectum. A urine sample. How is this treated? This condition is treated with antibiotic medicine. Antibiotic medicine may be given: To you when you go into labor, or as soon as your water breaks. The medicines will continue until after you give birth. If you are having a cesarean delivery, you do not need antibiotics unless your water has broken. To your baby, if he or she requires treatment. Your health care provider will check your baby to decide if he or she needs antibiotics to prevent a serious infection. Follow these instructions at home: Take over-the-counter and prescription medicines only as told by your health care provider. Take your antibiotic medicine as told by your health care provider. Do not stop taking the antibiotic even if you start to feel better. Keep all pre-birth (prenatal) visits and follow-up visits as told by your health care provider. This is important. Contact a health care provider if: You have pain or burning when you urinate. You have to urinate more often than usual. You have a fever or chills. You develop a bad-smelling vaginal discharge. Get help right away if: Your water breaks. You go into labor. You have severe pain in your abdomen. You have difficulty breathing. You have chest pain. These symptoms may represent a serious problem that is an emergency. Do not wait to see if the symptoms will go away. Get medical help right away. Call your local emergency services (911 in the [place] Do not drive  yourself to the hospital. Summary GBS is a type of bacteria that is common in healthy people. During pregnancy, colonization with GBS can cause serious complications for you or your baby. Your health care provider will screen you between 35 and 37 weeks of pregnancy to determine if you are colonized with GBS. If you are colonized with GBS during pregnancy, your health care provider will recommend antibiotics through an IV during labor. After delivery, your baby will be evaluated for complications related to potential  GBS infection and may require antibiotics to prevent a serious infection. This information is not intended to replace advice given to you by your health care provider. Make sure you discuss any questions you have with your health care provider. Document Revised: 06/20/2020 Document Reviewed: 03/15/2019 Elsevier Patient Education  2022 Elsevier Inc.       Pregnancy and Genital Herpes Genital herpes is an STI (sexually transmitted infection) that is caused by the herpes simplex virus (HSV). HSV can cause an outbreak of itching, blisters, and sores (ulcers) around the genitals and rectum. Even when the outbreak goes away, the virus stays in the body. If you are pregnant, you can pass HSV to your baby. If you become infected with HSV for the first time while you are pregnant, the virus can cause serious problems for your baby. If you had HSV before your pregnancy, the virus may not affect your baby as seriously. Babies that are infected with HSV are at risk for developing inflammation of the brain (encephalitis), damage to organs, and problems with development. How does this affect me? Your type of delivery may be affected. You may be able to have a vaginal delivery if you have no evidence of an outbreak when you go into labor. However, your baby may need to be delivered by C-section (cesarean delivery) if you have: An active, recurrent, or new herpes outbreak at the time of delivery.  This is because the virus can pass to your baby through an infected birth canal. This can cause severe problems for your baby. Any symptoms of infection in the areas around the genitals such as pain, burning, and itching, even if you do not have any ulcers in the birth canal. After delivery, you can breastfeed your baby. The virus will not be present in breast milk. While caring for your baby, you will need to take steps to avoid passing the virus on to your baby. How does this affect my baby? If the virus passes to your baby, it can cause serious problems. The virus can be passed to your baby: Before delivery. The virus can be passed to your unborn baby through the placenta. This is more likely to happen if you get herpes for the first time in the first 3 months of pregnancy (first trimester). This may cause your baby to have a congenital disability. During delivery. This is more likely to happen if you become infected for the first time late in your pregnancy. After delivery. Your baby can get a herpes infection if you touch active ulcers and then touch your baby without washing your hands. The virus is less likely to pass to your baby if you had herpes before you became pregnant. This is because antibodies against the virus develop over a period of time. These antibodies help to protect the baby. How is this treated? This condition can be treated with medicines during pregnancy that are safe for you and your baby. These medicines can help to reduce symptoms, shorten an outbreak, and prevent another outbreak of the infection. If the infection happened before you became pregnant, you may need to take medicine late in your pregnancy to help to prevent a breakout at the time of delivery. Follow these instructions at home: To avoid passing the virus to your baby: Wash your hands with soap and water often and before touching your baby. If you have an outbreak, keep the area clean and covered. If ulcers  are present on your breast, do not breastfeed from  the affected breast. Contact a health care provider if: You have a rash, blisters, or ulcers in the area around your genitals or rectum. You have burning, itching, or pain in the area around your genitals or rectum. You have trouble urinating. Summary Genital herpes is an STI (sexually transmitted infection) that is caused by the herpes simplex virus (HSV). If you are pregnant, you can pass the virus to your baby. Even when the outbreak goes away, the virus stays in your body. Genital herpes can be passed to your unborn or newborn baby and cause serious problems. This condition can be treated with medicines during pregnancy that are safe for you and your baby. Medicines can treat your symptoms, shorten the length of an outbreak, and prevent another outbreak of the infection. If you have signs or symptoms of a herpes outbreak when you go into labor, your health care provider may recommend a C-section (cesarean delivery) to lower the risk of passing the virus to your baby. This information is not intended to replace advice given to you by your health care provider. Make sure you discuss any questions you have with your health care provider. Document Revised: 12/11/2018 Document Reviewed: 11/19/2018 Elsevier Patient Education  2022 [employer]       Group B Streptococcus Infection During Pregnancy Group B Streptococcus (GBS) is a type of bacteria that is often found in healthy people. It is commonly found in the rectum, vagina, and intestines. In people who are healthy and not pregnant, the bacteria rarely cause serious illness or complications. However, women who test positive for GBS during pregnancy can pass the bacteria to the baby during childbirth. This can cause serious infection in the baby after birth. Women with GBS may also have infections during their pregnancy or soon after childbirth. The infections include urinary tract  infections (UTIs) or infections of the uterus. GBS also increases a woman's risk of complications during pregnancy, such as early labor or delivery, miscarriage, or stillbirth. Routine testing for GBS is recommended for all pregnant women. What are the causes? This condition is caused by bacteria called Streptococcus agalactiae. What increases the risk? You may have a higher risk for GBS infection during pregnancy if you had one during a past pregnancy. What are the signs or symptoms? In most cases, GBS infection does not cause symptoms in pregnant women. If symptoms exist, they may include: Labor that starts before the 37th week of pregnancy. A UTI or bladder infection. This may cause a fever, frequent urination, or pain and burning during urination. Fever during labor. There can also be a rapid heartbeat in the mother or baby. Rare but serious symptoms of a GBS infection in women include: Blood infection (septicemia). This may cause fever, chills, or confusion. Lung infection (pneumonia). This may cause fever, chills, cough, rapid breathing, chest pain, or difficulty breathing. Bone, joint, skin, or soft tissue infection. How is this diagnosed? You may be screened for GBS between week 35 and week 37 of pregnancy. If you have symptoms of preterm labor, you may be screened earlier. This condition is diagnosed based on lab test results from: A swab of fluid from the vagina and rectum. A urine sample. How is this treated? This condition is treated with antibiotic medicine. Antibiotic medicine may be given: To you when you go into labor, or as soon as your water breaks. The medicines will continue until after you give birth. If you are having a cesarean delivery, you do not need antibiotics unless  your water has broken. To your baby, if he or she requires treatment. Your health care provider will check your baby to decide if he or she needs antibiotics to prevent a serious infection. Follow these  instructions at home: Take over-the-counter and prescription medicines only as told by your health care provider. Take your antibiotic medicine as told by your health care provider. Do not stop taking the antibiotic even if you start to feel better. Keep all pre-birth (prenatal) visits and follow-up visits as told by your health care provider. This is important. Contact a health care provider if: You have pain or burning when you urinate. You have to urinate more often than usual. You have a fever or chills. You develop a bad-smelling vaginal discharge. Get help right away if: Your water breaks. You go into labor. You have severe pain in your abdomen. You have difficulty breathing. You have chest pain. These symptoms may represent a serious problem that is an emergency. Do not wait to see if the symptoms will go away. Get medical help right away. Call your local emergency services (911 in the [place] Do not drive yourself to the hospital. Summary GBS is a type of bacteria that is common in healthy people. During pregnancy, colonization with GBS can cause serious complications for you or your baby. Your health care provider will screen you between 35 and 37 weeks of pregnancy to determine if you are colonized with GBS. If you are colonized with GBS during pregnancy, your health care provider will recommend antibiotics through an IV during labor. After delivery, your baby will be evaluated for complications related to potential GBS infection and may require antibiotics to prevent a serious infection. This information is not intended to replace advice given to you by your health care provider. Make sure you discuss any questions you have with your health care provider. Document Revised: 06/20/2020 Document Reviewed: 03/15/2019 Elsevier Patient Education  2022 [employer].

## 2021-07-11 NOTE — Progress Notes (Addendum)
Subjective:  Melanie Strong is a 35 y.o. G2P0010 at [redacted]w[redacted]d being seen today for ongoing prenatal care.  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  Patient reports no complaints.  Contractions: Not present. Vag. Bleeding: None.  Movement: Present. Denies leaking of fluid.   The following portions of the patient's history were reviewed and updated as appropriate: allergies, current medications, past family history, past medical history, past social history, past surgical history and problem list. Problem list updated.  Objective:   Vitals:   07/11/21 0852  BP: 128/78  Pulse: 90  Weight: 183 lb 3.2 oz (83.1 kg)    Fetal Status: Fetal Heart Rate (bpm): 166   Movement: Present     General:  Alert, oriented and cooperative. Patient is in no acute distress.  Skin: Skin is warm and dry. No rash noted.   Cardiovascular: Normal heart rate noted  Respiratory: Normal respiratory effort, no problems with respiration noted  Abdomen: Soft, gravid, appropriate for gestational age. Pain/Pressure: Present     Pelvic: Vag. Bleeding: None     Cervical exam deferred        Extremities: Normal range of motion.  Edema: Trace  Mental Status: Normal mood and affect. Normal behavior. Normal judgment and thought content.   Urinalysis:      Assessment and Plan:  Pregnancy: G2P0010 at [redacted]w[redacted]d  1. Supervision of other normal pregnancy, antepartum -peds list given -CBE info given -RX valacyclovir, pt advised to start at 35 weeks -FHT in tachycardiac range x2 with Doppler, pt put on NST, per review with Dr. Debroah Loop, pt OK to go home after nst -EFM: reactive       -baseline: variable (145/170/155)       -variability:  moderate       -accels: present, 15x15       -decels: absent       -TOCO: few ctx, pt not feeling  PHQ9 SCORE ONLY 07/11/2021 05/30/2021 01/25/2021  PHQ-9 Total Score 4 1 7    GAD 7 : Generalized Anxiety Score 07/11/2021 07/20/2020 07/12/2020  Nervous, Anxious, on Edge 0 0 0  Control/stop worrying 0 1 2  Worry too much - different things 0 0 1  Trouble relaxing 0 0 0  Restless 0 0 0  Easily annoyed or irritable 0 2 0  Afraid - awful might happen 0 0 0  Total GAD 7 Score 0 3 3  Anxiety Difficulty Not difficult at all - -   2. [redacted] weeks gestation of pregnancy  3. Anemia affecting pregnancy in first trimester CBC Latest Ref Rng & Units 05/30/2021 05/09/2021 02/01/2021  WBC 3.4 - 10.8 x10E3/uL 8.5 9.8 5.7  Hemoglobin 11.1 - 15.9 g/dL 04/03/2021) 1.1(H) 4.1(D)  Hematocrit 34.0 - 46.6 % 29.2(L) 29.1(L) 28.3(L)  Platelets 150 - 450 x10E3/uL 205 214 232  -iron QOD  Preterm labor symptoms and general obstetric precautions including but not limited to vaginal bleeding, contractions, leaking of fluid and fetal movement were reviewed in detail with the patient. I discussed the assessment and treatment plan with the patient. The patient was provided an opportunity to ask questions and all were answered. The patient agreed with the plan and demonstrated an understanding of the instructions. The patient was advised to call back or seek  an in-person office evaluation/go to [facility]U at [facility] for any urgent or concerning symptoms. Please refer to After Visit Summary for other counseling recommendations.  Return in about 3 weeks (around 08/01/2021) for in-person LOB/APP OK/GBS/cultures/do not schedule before 08/01/2021.   Asmi Fugere, Gerrie Nordmann, NP

## 2021-07-11 NOTE — Progress Notes (Signed)
Pt presents for ROB c/o R side abdominal pain.

## 2021-08-01 ENCOUNTER — Ambulatory Visit (INDEPENDENT_AMBULATORY_CARE_PROVIDER_SITE_OTHER): Payer: BC Managed Care – PPO | Admitting: Women's Health

## 2021-08-01 ENCOUNTER — Other Ambulatory Visit: Payer: Self-pay

## 2021-08-01 ENCOUNTER — Other Ambulatory Visit (HOSPITAL_COMMUNITY)
Admission: RE | Admit: 2021-08-01 | Discharge: 2021-08-01 | Disposition: A | Discharge status: Home / Self Care | Payer: BC Managed Care – PPO | Source: Ambulatory Visit | Attending: Women's Health | Admitting: Women's Health

## 2021-08-01 VITALS — BP 123/73 | HR 94 | Wt 190.0 lb

## 2021-08-01 DIAGNOSIS — Z3480 Encounter for supervision of other normal pregnancy, unspecified trimester: Secondary | ICD-10-CM | POA: Insufficient documentation

## 2021-08-01 DIAGNOSIS — Z3A36 36 weeks gestation of pregnancy: Secondary | ICD-10-CM

## 2021-08-01 DIAGNOSIS — A6000 Herpesviral infection of urogenital system, unspecified: Secondary | ICD-10-CM

## 2021-08-01 DIAGNOSIS — O99011 Anemia complicating pregnancy, first trimester: Secondary | ICD-10-CM

## 2021-08-01 NOTE — Progress Notes (Signed)
ROB/GBS.  C/o swollen hands

## 2021-08-01 NOTE — Progress Notes (Signed)
Subjective:  Melanie Strong is a 35 y.o. G2P0010 at [redacted]w[redacted]d being seen today for ongoing prenatal care.  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  Patient reports no complaints.  Contractions: Not present. Vag. Bleeding: None.  Movement: Present. Denies leaking of fluid.   The following portions of the patient's history were reviewed and updated as appropriate: allergies, current medications, past family history, past medical history, past social history, past surgical history and problem list. Problem list updated.  Objective:   Vitals:   08/01/21 0930  BP: 123/73  Pulse: 94  Weight: 190 lb (86.2 kg)    Fetal Status: Fetal Heart Rate (bpm): 158 Fundal Height: 38 cm Movement: Present  Presentation: Undeterminable  General:  Alert, oriented and cooperative. Patient is in no acute distress.  Skin: Skin is warm and dry. No rash noted.   Cardiovascular: Normal heart rate noted  Respiratory: Normal respiratory effort, no problems with respiration noted  Abdomen: Soft, gravid, appropriate for gestational age. Pain/Pressure: Present     Pelvic: Vag. Bleeding: None     Cervical exam performed Dilation: Closed Effacement (%): Thick Station: Ballotable  Extremities: Normal range of motion.  Edema: Trace  Mental Status: Normal mood and affect. Normal behavior. Normal judgment and thought content.   Urinalysis:      Assessment and Plan:  Pregnancy: G2P0010 at [redacted]w[redacted]d  1. Supervision of other normal pregnancy, antepartum - Cervicovaginal ancillary only( [place]) - Strep Gp B NAA - Reviewed labor readiness with patient including the [employer], evening primrose oil, and raspberry leaf tea. Patient advised not  to start until after 37 weeks, pt would like information today to be able to get started ASAP after 37 weeks at home.  2. [redacted] weeks gestation of pregnancy PHQ9 SCORE ONLY 08/01/2021 07/11/2021 05/30/2021  PHQ-9 Total Score 3 4 1    GAD 7 : Generalized Anxiety Score 07/11/2021 07/20/2020 07/12/2020  Nervous, Anxious, on Edge 0 0 0  Control/stop worrying 0 1 2  Worry too much - different things 0 0 1  Trouble relaxing 0 0 0  Restless 0 0 0  Easily annoyed or irritable 0 2 0  Afraid - awful might happen 0 0 0  Total GAD 7 Score 0 3 3  Anxiety Difficulty Not difficult at all - -   3. Genital herpes simplex, unspecified site -on valtrex daily BID  4. Anemia affecting pregnancy in first trimester - CBC  Term labor symptoms and general obstetric precautions including but not limited to vaginal bleeding, contractions, leaking of fluid and fetal movement were reviewed in detail with the patient. I discussed the assessment and treatment plan with the patient. The patient was provided an opportunity to ask questions and all were answered. The patient agreed with the plan and demonstrated an understanding of the instructions. The patient was advised to call back or seek an in-person office evaluation/go to MAU at [facility] for any urgent or concerning symptoms. Please refer to After Visit Summary for other counseling recommendations.  Return in about 1 week (around 08/08/2021) for in-person LOB/APP OK.   Tuwanda Vokes, 14/03/2021, NP

## 2021-08-01 NOTE — Patient Instructions (Addendum)
Maternity Assessment Unit (MAU)  The Maternity Assessment Unit (MAU) is located at the [facility] and [place] at [facility]. The address is: [address] [place], [place], [place] [address]. Please see map below for additional directions.    The Maternity Assessment Unit is designed to help you during your pregnancy, and for up to 6 weeks after delivery, with any pregnancy- or postpartum-related emergencies, if you think you are in labor, or if your water has broken. For example, if you experience nausea and vomiting, vaginal bleeding, severe abdominal or pelvic pain, elevated blood pressure or other problems related to your pregnancy or postpartum time, please come to the Maternity Assessment Unit for assistance.       Third Trimester of Pregnancy The third trimester of pregnancy is from week 28 through week 35. This is months 7 through 9. The third trimester is a time when the unborn baby (fetus) is growing rapidly. At the end of the ninth month, the fetus is about 20 inches long and weighs 6-10 pounds. Body changes during your third trimester During the third trimester, your body will continue to go through many changes. The changes vary and generally return to normal after your baby is born. Physical changes Your weight will continue to increase. You can expect to gain 25-35 pounds (11-16 kg) by the end of the pregnancy if you begin pregnancy at a normal weight. If you are underweight, you can expect to gain 28-40 lb (about 13-18 kg), and if you are overweight, you can expect to gain 15-25 lb (about 7-11 kg). You may begin to get stretch marks on your hips, abdomen, and breasts. Your breasts will continue to grow and may hurt. A yellow fluid (colostrum) may leak from your breasts. This is the first milk you are producing for your baby. You may have changes in your hair. These can include thickening of your hair, rapid growth, and changes in texture. Some people also  have hair loss during or after pregnancy, or hair that feels dry or thin. Your belly button may stick out. You may notice more swelling in your hands, face, or ankles. Health changes You may have heartburn. You may have constipation. You may develop hemorrhoids. You may develop swollen, bulging veins (varicose veins) in your legs. You may have increased body aches in the pelvis, back, or thighs. This is due to weight gain and increased hormones that are relaxing your joints. You may have increased tingling or numbness in your hands, arms, and legs. The skin on your abdomen may also feel numb. You may feel short of breath because of your expanding uterus. Other changes You may urinate more often because the fetus is moving lower into your pelvis and pressing on your bladder. You may have more problems sleeping. This may be caused by the size of your abdomen, an increased need to urinate, and an increase in your body's metabolism. You may notice the fetus "dropping," or moving lower in your abdomen (lightening). You may have increased vaginal discharge. You may notice that you have pain around your pelvic bone as your uterus distends. Follow these instructions at home: Medicines Follow your health care provider's instructions regarding medicine use. Specific medicines may be either safe or unsafe to take during pregnancy. Do not take any medicines unless approved by your health care provider. Take a prenatal vitamin that contains at least 600 micrograms (mcg) of folic acid. Eating and drinking Eat a healthy diet that includes fresh fruits  and vegetables, whole grains, good sources of protein such as meat, eggs, or tofu, and low-fat dairy products. Avoid raw meat and unpasteurized juice, milk, and cheese. These carry germs that can harm you and your baby. Eat 4 or 5 small meals rather than 3 large meals a day. You may need to take these actions to prevent or treat constipation: Drink enough  fluid to keep your urine pale yellow. Eat foods that are high in fiber, such as beans, whole grains, and fresh fruits and vegetables. Limit foods that are high in fat and processed sugars, such as fried or sweet foods. Activity Exercise only as directed by your health care provider. Most people can continue their usual exercise routine during pregnancy. Try to exercise for 30 minutes at least 5 days a week. Stop exercising if you experience contractions in the uterus. Stop exercising if you develop pain or cramping in the lower abdomen or lower back. Avoid heavy lifting. Do not exercise if it is very hot or humid or if you are at a high altitude. If you choose to, you may continue to have sex unless your health care provider tells you not to. Relieving pain and discomfort Take frequent breaks and rest with your legs raised (elevated) if you have leg cramps or low back pain. Take warm sitz baths to soothe any pain or discomfort caused by hemorrhoids. Use hemorrhoid cream if your health care provider approves. Wear a supportive bra to prevent discomfort from breast tenderness. If you develop varicose veins: Wear support hose as told by your health care provider. Elevate your feet for 15 minutes, 3-4 times a day. Limit salt in your diet. Safety Talk to your health care provider before traveling far distances. Do not use hot tubs, steam rooms, or saunas. Wear your seat belt at all times when driving or riding in a car. Talk with your health care provider if someone is verbally or physically abusive to you. Preparing for birth To prepare for the arrival of your baby: Take prenatal classes to understand, practice, and ask questions about labor and delivery. Visit the hospital and tour the maternity area. Purchase a rear-facing car seat and make sure you know how to install it in your car. Prepare the baby's room or sleeping area. Make sure to remove all pillows and stuffed animals from the  baby's crib to prevent suffocation. General instructions Avoid cat litter boxes and soil used by cats. These carry germs that can cause birth defects in the baby. If you have a cat, ask someone to clean the litter box for you. Do not douche or use tampons. Do not use scented sanitary pads. Do not use any products that contain nicotine or tobacco, such as cigarettes, e-cigarettes, and chewing tobacco. If you need help quitting, ask your health care provider. Do not use any herbal remedies, illegal drugs, or medicines that were not prescribed to you. Chemicals in these products can harm your baby. Do not drink alcohol. You will have more frequent prenatal exams during the third trimester. During a routine prenatal visit, your health care provider will do a physical exam, perform tests, and discuss your overall health. Keep all follow-up visits. This is important. Where to find more information American Pregnancy Association: americanpregnancy.[place] and Gynecologists: PoolDevices.com.pt Office on [employer]: KeywordPortfolios.com.br Contact a health care provider if you have: A fever. Mild pelvic cramps, pelvic pressure, or nagging pain in your abdominal area or lower back. Vomiting or diarrhea. Bad-smelling  vaginal discharge or foul-smelling urine. Pain when you urinate. A headache that does not go away when you take medicine. Visual changes or see spots in front of your eyes. Get help right away if: Your water breaks. You have regular contractions less than 5 minutes apart. You have spotting or bleeding from your vagina. You have severe abdominal pain. You have difficulty breathing. You have chest pain. You have fainting spells. You have not felt your baby move for the time period told by your health care provider. You have new or increased pain, swelling, or redness in an arm or leg. Summary The third trimester of pregnancy is  from week 28 through week 40 (months 7 through 9). You may have more problems sleeping. This can be caused by the size of your abdomen, an increased need to urinate, and an increase in your body's metabolism. You will have more frequent prenatal exams during the third trimester. Keep all follow-up visits. This is important. This information is not intended to replace advice given to you by your health care provider. Make sure you discuss any questions you have with your health care provider. Document Revised: 01/26/2020 Document Reviewed: 12/02/2019 Elsevier Patient Education  2022 Elsevier Inc.       Group B Streptococcus Test During Pregnancy Why am I having this test? Routine testing, also called screening, for group B streptococcus (GBS) is recommended for all pregnant women between the 36th and 37th week of pregnancy. GBS is a type of bacteria that can be passed from mother to baby during childbirth. Screening will help guide whether or not you will need treatment during labor and delivery to prevent complications such as: An infection in your uterus during labor. An infection in your uterus after delivery. A serious infection in your baby after delivery, such as pneumonia, meningitis, or sepsis. GBS screening is not often done before 36 weeks of pregnancy unless you go into labor prematurely. What happens if I have group B streptococcus? If testing shows that you have GBS, your health care provider will recommend treatment with IV antibiotics during labor and delivery. This treatment significantly decreases the risk of complications for you and your baby. If you have a planned C-section and you have GBS, you may not need to be treated with antibiotics because GBS is usually passed to babies after labor starts and your water breaks. If you are in labor or your water breaks before your C-section, it is possible for GBS to get into your uterus and be passed to your baby, so you might need  treatment. Is there a chance I may not need to be tested? You may not need to be tested for GBS if: You have a urine test that shows GBS before 36 to 37 weeks. You had a baby with GBS infection after a previous delivery. In these cases, you will automatically be treated for GBS during labor and delivery. What is being tested? This test is done to check if you have group B streptococcus in your vagina or rectum. What kind of sample is taken? To collect samples for this test, your health care provider will swab your vagina and rectum with a cotton swab. The sample is then sent to the lab to see if GBS is present. What happens during the test?  You will remove your clothing from the waist down. You will lie down on an exam table in the same position as you would for a pelvic exam. Your health care provider will  swab your vagina and rectum to collect samples for a culture test. You will be able to go home after the test and do all your usual activities. How are the results reported? The test results are reported as positive or negative. What do the results mean? A positive test means you are at risk for passing GBS to your baby during labor and delivery. Your health care provider will recommend that you are treated with an IV antibiotic during labor and delivery. A negative test means you are at very low risk of passing GBS to your baby. There is still a low risk of passing GBS to your baby because sometimes test results may report that you do not have a condition when you do (false-negative result) or there is a chance that you may become infected with GBS after the test is done. You most likely will not need to be treated with an antibiotic during labor and delivery. Talk with your health care provider about what your results mean. Questions to ask your health care provider Ask your health care provider, or the department that is doing the test: When will my results be ready? How will I get my  results? What are my treatment options? Summary Routine testing (screening) for group B streptococcus (GBS) is recommended for all pregnant women between the 36th and 37th week of pregnancy. GBS is a type of bacteria that can be passed from mother to baby during childbirth. If testing shows that you have GBS, your health care provider will recommend that you are treated with IV antibiotics during labor and delivery. This treatment almost always prevents infection in newborns. This information is not intended to replace advice given to you by your health care provider. Make sure you discuss any questions you have with your health care provider. Document Revised: 06/20/2020 Document Reviewed: 09/16/2018 Elsevier Patient Education  2022 Elsevier Inc.       Signs and Symptoms of Labor Labor is the body's natural process of moving the baby and the placenta out of the uterus. The process of labor usually starts when the baby is full-term, between 72 and 41 weeks of pregnancy. Signs and symptoms that you are close to going into labor As your body prepares for labor and the birth of your baby, you may notice the following symptoms in the weeks and days before true labor starts: Passing a small amount of thick, bloody mucus from your vagina. This is called normal bloody show or losing your mucus plug. This may happen more than a week before labor begins, or right before labor begins, as the opening of the cervix starts to widen (dilate). For some women, the entire mucus plug passes at once. For others, pieces of the mucus plug may gradually pass over several days. Your baby moving (dropping) lower in your pelvis to get into position for birth (lightening). When this happens, you may feel more pressure on your bladder and pelvic bone and less pressure on your ribs. This may make it easier to breathe. It may also cause you to need to urinate more often and have problems with bowel movements. Having  "practice contractions," also called Braxton Hicks contractions or false labor. These occur at irregular (unevenly spaced) intervals that are more than 10 minutes apart. False labor contractions are common after exercise or sexual activity. They will stop if you change position, rest, or drink fluids. These contractions are usually mild and do not get stronger over time. They may feel like:  A backache or back pain. Mild cramps, similar to menstrual cramps. Tightening or pressure in your abdomen. Other early symptoms include: Nausea or loss of appetite. Diarrhea. Having a sudden burst of energy, or feeling very tired. Mood changes. Having trouble sleeping. Signs and symptoms that labor has begun Signs that you are in labor may include: Having contractions that come at regular (evenly spaced) intervals and increase in intensity. This may feel like more intense tightening or pressure in your abdomen that moves to your back. Contractions may also feel like rhythmic pain in your upper thighs or back that comes and goes at regular intervals. If you are delivering for the first time, this change in intensity of contractions often occurs at a more gradual pace. If you have given birth before, you may notice a more rapid progression of contraction changes. Feeling pressure in the vaginal area. Your water breaking (rupture of membranes). This is when the sac of fluid that surrounds your baby breaks. Fluid leaking from your vagina may be clear or blood-tinged. Labor usually starts within 24 hours of your water breaking, but it may take longer to begin. Some people may feel a sudden gush of fluid; others may notice repeatedly damp underwear. Follow these instructions at home:  When labor starts, or if your water breaks, call your health care provider or nurse care line. Based on your situation, they will determine when you should go in for an exam. During early labor, you may be able to rest and manage  symptoms at home. Some strategies to try at home include: Breathing and relaxation techniques. Taking a warm bath or shower. Listening to music. Using a heating pad on the lower back for pain. If directed, apply heat to the area as often as told by your health care provider. Use the heat source that your health care provider recommends, such as a moist heat pack or a heating pad. Place a towel between your skin and the heat source. Leave the heat on for 20-30 minutes. Remove the heat if your skin turns bright red. This is especially important if you are unable to feel pain, heat, or cold. You have a greater risk of getting burned. Contact a health care provider if: Your labor has started. Your water breaks. You have nausea, vomiting, or diarrhea. Get help right away if: You have painful, regular contractions that are 5 minutes apart or less. Labor starts before you are [redacted] weeks along in your pregnancy. You have a fever. You have bright red blood coming from your vagina. You do not feel your baby moving. You have a severe headache with or without vision problems. You have chest pain or shortness of breath. These symptoms may represent a serious problem that is an emergency. Do not wait to see if the symptoms will go away. Get medical help right away. Call your local emergency services (911 in the [place] Do not drive yourself to the hospital. Summary Labor is your body's natural process of moving your baby and the placenta out of your uterus. The process of labor usually starts when your baby is full-term, between 22 and 40 weeks of pregnancy. When labor starts, or if your water breaks, call your health care provider or nurse care line. Based on your situation, they will determine when you should go in for an exam. This information is not intended to replace advice given to you by your health care provider. Make sure you discuss any questions you have with your health  care provider. Document  Revised: 01/02/2021 Document Reviewed: 01/02/2021 Elsevier Patient Education  2022 [employer].       Things to Try After 37 weeks to Encourage Labor/Get Ready for Labor:    Try the [employer] at https://glass.com/.com daily to improve baby's position and encourage the onset of labor.  Walk a little and rest a little every day.  Change positions often.  Cervical Ripening: May try one or both Red Raspberry Leaf capsules or tea:  two 300mg  or 400mg  tablets with each meal, 2-3 times a day, or 1-3 cups of tea daily  Potential Side Effects Of Raspberry Leaf:  Most women do not experience any side effects from drinking raspberry leaf tea. However, nausea and loose stools are possible   Evening Primrose Oil capsules: take 1 capsule by mouth and place one capsule in the vagina every night.    Some of the potential side effects:  Upset stomach  Loose stools or diarrhea  Headaches  Nausea  Sex can also help the cervix ripen and encourage labor onset.

## 2021-08-02 LAB — CERVICOVAGINAL ANCILLARY ONLY
Chlamydia: NEGATIVE
Comment: NEGATIVE
Comment: NORMAL
Neisseria Gonorrhea: NEGATIVE

## 2021-08-02 LAB — CBC
Hematocrit: 31.8 % — ABNORMAL LOW (ref 34.0–46.6)
Hemoglobin: 10.6 g/dL — ABNORMAL LOW (ref 11.1–15.9)
MCH: 27.9 pg (ref 26.6–33.0)
MCHC: 33.3 g/dL (ref 31.5–35.7)
MCV: 84 fL (ref 79–97)
Platelets: 187 10*3/uL (ref 150–450)
RBC: 3.8 x10E6/uL (ref 3.77–5.28)
RDW: 15.2 % (ref 11.7–15.4)
WBC: 8.1 10*3/uL (ref 3.4–10.8)

## 2021-08-03 LAB — STREP GP B NAA: Strep Gp B NAA: POSITIVE — AB

## 2021-08-08 ENCOUNTER — Other Ambulatory Visit: Payer: Self-pay

## 2021-08-08 ENCOUNTER — Encounter: Payer: Self-pay | Admitting: Family Medicine

## 2021-08-08 ENCOUNTER — Ambulatory Visit (INDEPENDENT_AMBULATORY_CARE_PROVIDER_SITE_OTHER): Payer: BC Managed Care – PPO | Admitting: Family Medicine

## 2021-08-08 VITALS — BP 128/69 | HR 90 | Wt 191.0 lb

## 2021-08-08 DIAGNOSIS — Z3480 Encounter for supervision of other normal pregnancy, unspecified trimester: Secondary | ICD-10-CM

## 2021-08-08 DIAGNOSIS — O99011 Anemia complicating pregnancy, first trimester: Secondary | ICD-10-CM

## 2021-08-08 DIAGNOSIS — B951 Streptococcus, group B, as the cause of diseases classified elsewhere: Secondary | ICD-10-CM | POA: Insufficient documentation

## 2021-08-08 DIAGNOSIS — Z3A37 37 weeks gestation of pregnancy: Secondary | ICD-10-CM

## 2021-08-08 DIAGNOSIS — A6000 Herpesviral infection of urogenital system, unspecified: Secondary | ICD-10-CM

## 2021-08-08 NOTE — Progress Notes (Signed)
   PRENATAL VISIT NOTE  Subjective:  Melanie Strong is a 35 y.o. G2P0010 at [redacted]w[redacted]d being seen today for ongoing prenatal care.  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  Patient reports increased pain and pressure. She states it is more constant now and located on the sides of her lower abdomen. Contractions: Irregular. Vag. Bleeding: None.  Movement: Present. Denies leaking of fluid.   The following portions of the patient's history were reviewed and updated as appropriate: allergies, current medications, past family history, past medical history, past social history, past surgical history and problem list.   Objective:   Vitals:   08/08/21 0834  BP: 128/69  Pulse: 90  Weight: 191 lb (86.6 kg)    Fetal Status: Fetal Heart Rate (bpm): 155 Fundal Height: 37 cm Movement: Present  General:  Alert, oriented and cooperative. Patient is in no acute distress.  Skin: Skin is warm and dry. No rash noted.   Cardiovascular: Normal heart rate noted.  Respiratory: Normal respiratory effort, no problems with respiration noted.  Abdomen: Soft, gravid, appropriate for gestational age.  Pelvic: Cervical exam performed in the presence of a chaperone. Dilation: Closed Effacement (%): Thick Station: Ballotable.  Extremities: Normal range of motion. No LE edema.   Mental Status: Normal mood and affect. Normal behavior. Normal judgment and thought content.   Assessment and Plan:  Pregnancy: G2P0010 at [redacted]w[redacted]d  1. Supervision of other normal pregnancy, antepartum 2. [redacted] weeks gestation of pregnancy Progressing well. FH and FHT within normal limits. Reports increased pelvic pain and pressure. Cervix checked  and remains closed. Discussed that the pain/pressure she is feeling, especially in the bilateral lower abdomen, is likely related to ligamentous pain and stretching due to progressing pregnancy. Reviewed signs/symptoms of labor. Patient is to follow up in 1 week for next OB visit as scheduled. Would like to discuss induction next visit. Hoping to be scheduled around 40 weeks.  3. Anemia affecting pregnancy in first trimester Hemoglobin last visit improved to 10.6 from 9.7. Will continue PO iron supplementation.    4. Genital herpes simplex, unspecified site On Valtrex prophylaxis. No symptoms.  5. Positive GBS test Plan for PCN in labor.   Term labor symptoms and general obstetric precautions including but not limited to vaginal bleeding, contractions, leaking of fluid and fetal movement were reviewed in detail with the patient.  Please refer to After Visit Summary for other counseling recommendations.   Return in about 1 week (around 08/15/2021) for follow up LR OB visit.  Future Appointments  Date Time Provider Department Center  08/15/2021  8:55 AM Nugent, Odie Sera, NP [facility] None    Worthy Rancher, MD

## 2021-08-08 NOTE — Progress Notes (Signed)
Pt would like cervix check today. Pt states she is in more pain this week.

## 2021-08-15 ENCOUNTER — Ambulatory Visit (INDEPENDENT_AMBULATORY_CARE_PROVIDER_SITE_OTHER): Payer: BC Managed Care – PPO | Admitting: Women's Health

## 2021-08-15 ENCOUNTER — Telehealth: Payer: Self-pay | Admitting: Women's Health

## 2021-08-15 ENCOUNTER — Other Ambulatory Visit: Payer: Self-pay

## 2021-08-15 VITALS — BP 125/80 | HR 79 | Wt 192.8 lb

## 2021-08-15 DIAGNOSIS — B951 Streptococcus, group B, as the cause of diseases classified elsewhere: Secondary | ICD-10-CM

## 2021-08-15 DIAGNOSIS — Z3480 Encounter for supervision of other normal pregnancy, unspecified trimester: Secondary | ICD-10-CM

## 2021-08-15 DIAGNOSIS — Z3A38 38 weeks gestation of pregnancy: Secondary | ICD-10-CM

## 2021-08-15 DIAGNOSIS — A6000 Herpesviral infection of urogenital system, unspecified: Secondary | ICD-10-CM

## 2021-08-15 DIAGNOSIS — O99011 Anemia complicating pregnancy, first trimester: Secondary | ICD-10-CM

## 2021-08-15 NOTE — Progress Notes (Signed)
Patient presents for Rob. Patient has no concerns today.

## 2021-08-15 NOTE — Progress Notes (Signed)
Subjective:  Melanie Strong is a 35 y.o. G2P0010 at [redacted]w[redacted]d being seen today for ongoing prenatal care.  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  Patient reports no complaints.  Contractions: Not present. Vag. Bleeding: None.  Movement: Present. Denies leaking of fluid.   The following portions of the patient's history were reviewed and updated as appropriate: allergies, current medications, past family history, past medical history, past social history, past surgical history and problem list. Problem list updated.  Objective:   Vitals:   08/15/21 0904  BP: 125/80  Pulse: 79  Weight: 192 lb 12.8 oz (87.5 kg)    Fetal Status: Fetal Heart Rate (bpm): 145   Movement: Present     General:  Alert, oriented and cooperative. Patient is in no acute distress.  Skin: Skin is warm and dry. No rash noted.   Cardiovascular: Normal heart rate noted  Respiratory: Normal respiratory effort, no problems with respiration noted  Abdomen: Soft, gravid, appropriate for gestational age. Pain/Pressure: Present     Pelvic: Vag. Bleeding: None     Cervical exam deferred        Extremities: Normal range of motion.     Mental Status: Normal mood and affect. Normal behavior. Normal judgment and thought content.   Urinalysis:      Assessment and Plan:  Pregnancy: G2P0010 at [redacted]w[redacted]d  1. Supervision of other normal pregnancy, antepartum -no concerns  2. Anemia affecting pregnancy in first trimester CBC Latest Ref Rng & Units 08/01/2021 05/30/2021 05/09/2021  WBC 3.4 - 10.8 x10E3/uL 8.1 8.5 9.8  Hemoglobin 11.1 - 15.9 g/dL 10.6(L) 9.7(L) 9.4(L)  Hematocrit 34.0 - 46.6 % 31.8(L) 29.2(L) 29.1(L)  Platelets 150 -  450 x10E3/uL 187 205 214   3. Genital herpes simplex, unspecified site -pt on suppressive therapy  4. Positive GBS test -treat in labor  5. [redacted] weeks gestation of pregnancy  Term labor symptoms and general obstetric precautions including but not limited to vaginal bleeding, contractions, leaking of fluid and fetal movement were reviewed in detail with the patient. I discussed the assessment and treatment plan with the patient. The patient was provided an opportunity to ask questions and all were answered. The patient agreed with the plan and demonstrated an understanding of the instructions. The patient was advised to call back or seek an in-person office evaluation/go to MAU at [facility] for any urgent or concerning symptoms. Please refer to After Visit Summary for other counseling recommendations.  Return in about 1 week (around 08/22/2021) for in-person LOB/APP OK.   Labria Wos, Odie Sera, NP

## 2021-08-15 NOTE — Patient Instructions (Signed)
Maternity Assessment Unit (MAU)  The Maternity Assessment Unit (MAU) is located at the [facility] at [facility]. The address is: [address], [place], [place], [place] [address]. Please see map below for additional directions.    The Maternity Assessment Unit is designed to help you during your pregnancy, and for up to 6 weeks after delivery, with any pregnancy- or postpartum-related emergencies, if you think you are in labor, or if your water has broken. For example, if you experience nausea and vomiting, vaginal bleeding, severe abdominal or pelvic pain, elevated blood pressure or other problems related to your pregnancy or postpartum time, please come to the Maternity Assessment Unit for assistance.       Signs and Symptoms of Labor Labor is the body's natural process of moving the baby and the placenta out of the uterus. The process of labor usually starts when the baby is full-term, between 46 and 41 weeks of pregnancy. Signs and symptoms that you are close to going into labor As your body prepares for labor and the birth of your baby, you may notice the following symptoms in the weeks and days before true labor starts: Passing a small amount of thick, bloody mucus from your vagina. This is called normal bloody show or losing your mucus plug. This may happen more than a week before labor begins, or right before labor begins, as the opening of the cervix starts to widen (dilate). For some women, the entire mucus plug passes at once. For others, pieces of the mucus plug may gradually pass over several days. Your baby moving (dropping) lower in your pelvis to get into position for birth (lightening). When this happens, you may feel more pressure on your bladder and pelvic bone and less pressure on your ribs. This may make it easier to breathe. It may also cause you to need to urinate more often and have problems with bowel movements. Having "practice  contractions," also called Braxton Hicks contractions or false labor. These occur at irregular (unevenly spaced) intervals that are more than 10 minutes apart. False labor contractions are common after exercise or sexual activity. They will stop if you change position, rest, or drink fluids. These contractions are usually mild and do not get stronger over time. They may feel like: A backache or back pain. Mild cramps, similar to menstrual cramps. Tightening or pressure in your abdomen. Other early symptoms include: Nausea or loss of appetite. Diarrhea. Having a sudden burst of energy, or feeling very tired. Mood changes. Having trouble sleeping. Signs and symptoms that labor has begun Signs that you are in labor may include: Having contractions that come at regular (evenly spaced) intervals and increase in intensity. This may feel like more intense tightening or pressure in your abdomen that moves to your back. Contractions may also feel like rhythmic pain in your upper thighs or back that comes and goes at regular intervals. If you are delivering for the first time, this change in intensity of contractions often occurs at a more gradual pace. If you have given birth before, you may notice a more rapid progression of contraction changes. Feeling pressure in the vaginal area. Your water breaking (rupture of membranes). This is when the sac of fluid that surrounds your baby breaks. Fluid leaking from your vagina may be clear or blood-tinged. Labor usually starts within 24 hours of your water breaking, but it may take longer to begin. Some people may feel a sudden gush of fluid;  others may notice repeatedly damp underwear. Follow these instructions at home:  When labor starts, or if your water breaks, call your health care provider or nurse care line. Based on your situation, they will determine when you should go in for an exam. During early labor, you may be able to rest and manage symptoms at  home. Some strategies to try at home include: Breathing and relaxation techniques. Taking a warm bath or shower. Listening to music. Using a heating pad on the lower back for pain. If directed, apply heat to the area as often as told by your health care provider. Use the heat source that your health care provider recommends, such as a moist heat pack or a heating pad. Place a towel between your skin and the heat source. Leave the heat on for 20-30 minutes. Remove the heat if your skin turns bright red. This is especially important if you are unable to feel pain, heat, or cold. You have a greater risk of getting burned. Contact a health care provider if: Your labor has started. Your water breaks. You have nausea, vomiting, or diarrhea. Get help right away if: You have painful, regular contractions that are 5 minutes apart or less. Labor starts before you are [redacted] weeks along in your pregnancy. You have a fever. You have bright red blood coming from your vagina. You do not feel your baby moving. You have a severe headache with or without vision problems. You have chest pain or shortness of breath. These symptoms may represent a serious problem that is an emergency. Do not wait to see if the symptoms will go away. Get medical help right away. Call your local emergency services (911 in the [place] Do not drive yourself to the hospital. Summary Labor is your body's natural process of moving your baby and the placenta out of your uterus. The process of labor usually starts when your baby is full-term, between 31 and 40 weeks of pregnancy. When labor starts, or if your water breaks, call your health care provider or nurse care line. Based on your situation, they will determine when you should go in for an exam. This information is not intended to replace advice given to you by your health care provider. Make sure you discuss any questions you have with your health care provider. Document Revised:  01/02/2021 Document Reviewed: 01/02/2021 Elsevier Patient Education  2022 Elsevier Inc.       Pregnancy and Genital Herpes Genital herpes is an STI (sexually transmitted infection) that is caused by the herpes simplex virus (HSV). HSV can cause an outbreak of itching, blisters, and sores (ulcers) around the genitals and rectum. Even when the outbreak goes away, the virus stays in the body. If you are pregnant, you can pass HSV to your baby. If you become infected with HSV for the first time while you are pregnant, the virus can cause serious problems for your baby. If you had HSV before your pregnancy, the virus may not affect your baby as seriously. Babies that are infected with HSV are at risk for developing inflammation of the brain (encephalitis), damage to organs, and problems with development. How does this affect me? Your type of delivery may be affected. You may be able to have a vaginal delivery if you have no evidence of an outbreak when you go into labor. However, your baby may need to be delivered by C-section (cesarean delivery) if you have: An active, recurrent, or new herpes outbreak at the time of  delivery. This is because the virus can pass to your baby through an infected birth canal. This can cause severe problems for your baby. Any symptoms of infection in the areas around the genitals such as pain, burning, and itching, even if you do not have any ulcers in the birth canal. After delivery, you can breastfeed your baby. The virus will not be present in breast milk. While caring for your baby, you will need to take steps to avoid passing the virus on to your baby. How does this affect my baby? If the virus passes to your baby, it can cause serious problems. The virus can be passed to your baby: Before delivery. The virus can be passed to your unborn baby through the placenta. This is more likely to happen if you get herpes for the first time in the first 3 months of pregnancy  (first trimester). This may cause your baby to have a congenital disability. During delivery. This is more likely to happen if you become infected for the first time late in your pregnancy. After delivery. Your baby can get a herpes infection if you touch active ulcers and then touch your baby without washing your hands. The virus is less likely to pass to your baby if you had herpes before you became pregnant. This is because antibodies against the virus develop over a period of time. These antibodies help to protect the baby. How is this treated? This condition can be treated with medicines during pregnancy that are safe for you and your baby. These medicines can help to reduce symptoms, shorten an outbreak, and prevent another outbreak of the infection. If the infection happened before you became pregnant, you may need to take medicine late in your pregnancy to help to prevent a breakout at the time of delivery. Follow these instructions at home: To avoid passing the virus to your baby: Wash your hands with soap and water often and before touching your baby. If you have an outbreak, keep the area clean and covered. If ulcers are present on your breast, do not breastfeed from the affected breast. Contact a health care provider if: You have a rash, blisters, or ulcers in the area around your genitals or rectum. You have burning, itching, or pain in the area around your genitals or rectum. You have trouble urinating. Summary Genital herpes is an STI (sexually transmitted infection) that is caused by the herpes simplex virus (HSV). If you are pregnant, you can pass the virus to your baby. Even when the outbreak goes away, the virus stays in your body. Genital herpes can be passed to your unborn or newborn baby and cause serious problems. This condition can be treated with medicines during pregnancy that are safe for you and your baby. Medicines can treat your symptoms, shorten the length of an  outbreak, and prevent another outbreak of the infection. If you have signs or symptoms of a herpes outbreak when you go into labor, your health care provider may recommend a C-section (cesarean delivery) to lower the risk of passing the virus to your baby. This information is not intended to replace advice given to you by your health care provider. Make sure you discuss any questions you have with your health care provider. Document Revised: 12/11/2018 Document Reviewed: 11/19/2018 Elsevier Patient Education  2022 [employer].

## 2021-08-20 ENCOUNTER — Encounter (HOSPITAL_COMMUNITY): Payer: Self-pay | Admitting: Obstetrics & Gynecology

## 2021-08-20 ENCOUNTER — Other Ambulatory Visit: Payer: Self-pay

## 2021-08-20 ENCOUNTER — Inpatient Hospital Stay (HOSPITAL_COMMUNITY)
Admission: AD | Admit: 2021-08-20 | Discharge: 2021-08-25 | DRG: 787 | Disposition: A | Discharge status: Home / Self Care | Payer: BC Managed Care – PPO | Attending: Obstetrics and Gynecology | Admitting: Obstetrics and Gynecology

## 2021-08-20 DIAGNOSIS — F17210 Nicotine dependence, cigarettes, uncomplicated: Secondary | ICD-10-CM | POA: Diagnosis present

## 2021-08-20 DIAGNOSIS — A6000 Herpesviral infection of urogenital system, unspecified: Secondary | ICD-10-CM | POA: Diagnosis present

## 2021-08-20 DIAGNOSIS — O1414 Severe pre-eclampsia complicating childbirth: Secondary | ICD-10-CM | POA: Diagnosis present

## 2021-08-20 DIAGNOSIS — O368130 Decreased fetal movements, third trimester, not applicable or unspecified: Secondary | ICD-10-CM | POA: Diagnosis present

## 2021-08-20 DIAGNOSIS — K9189 Other postprocedural complications and disorders of digestive system: Secondary | ICD-10-CM | POA: Diagnosis not present

## 2021-08-20 DIAGNOSIS — O99824 Streptococcus B carrier state complicating childbirth: Secondary | ICD-10-CM | POA: Diagnosis present

## 2021-08-20 DIAGNOSIS — Z7982 Long term (current) use of aspirin: Secondary | ICD-10-CM | POA: Diagnosis not present

## 2021-08-20 DIAGNOSIS — O169 Unspecified maternal hypertension, unspecified trimester: Secondary | ICD-10-CM

## 2021-08-20 DIAGNOSIS — O9962 Diseases of the digestive system complicating childbirth: Secondary | ICD-10-CM | POA: Diagnosis present

## 2021-08-20 DIAGNOSIS — O99892 Other specified diseases and conditions complicating childbirth: Secondary | ICD-10-CM | POA: Diagnosis not present

## 2021-08-20 DIAGNOSIS — O99334 Smoking (tobacco) complicating childbirth: Secondary | ICD-10-CM | POA: Diagnosis present

## 2021-08-20 DIAGNOSIS — Z148 Genetic carrier of other disease: Secondary | ICD-10-CM | POA: Diagnosis not present

## 2021-08-20 DIAGNOSIS — Z20822 Contact with and (suspected) exposure to covid-19: Secondary | ICD-10-CM | POA: Diagnosis present

## 2021-08-20 DIAGNOSIS — O99820 Streptococcus B carrier state complicating pregnancy: Secondary | ICD-10-CM | POA: Diagnosis not present

## 2021-08-20 DIAGNOSIS — D563 Thalassemia minor: Secondary | ICD-10-CM | POA: Diagnosis present

## 2021-08-20 DIAGNOSIS — O163 Unspecified maternal hypertension, third trimester: Secondary | ICD-10-CM

## 2021-08-20 DIAGNOSIS — K567 Ileus, unspecified: Secondary | ICD-10-CM | POA: Diagnosis not present

## 2021-08-20 DIAGNOSIS — O26893 Other specified pregnancy related conditions, third trimester: Secondary | ICD-10-CM | POA: Diagnosis present

## 2021-08-20 DIAGNOSIS — O1410 Severe pre-eclampsia, unspecified trimester: Secondary | ICD-10-CM | POA: Diagnosis not present

## 2021-08-20 DIAGNOSIS — R140 Abdominal distension (gaseous): Secondary | ICD-10-CM

## 2021-08-20 DIAGNOSIS — Z3A38 38 weeks gestation of pregnancy: Secondary | ICD-10-CM

## 2021-08-20 DIAGNOSIS — Z3A39 39 weeks gestation of pregnancy: Secondary | ICD-10-CM | POA: Diagnosis not present

## 2021-08-20 DIAGNOSIS — N179 Acute kidney failure, unspecified: Secondary | ICD-10-CM | POA: Diagnosis not present

## 2021-08-20 DIAGNOSIS — B951 Streptococcus, group B, as the cause of diseases classified elsewhere: Secondary | ICD-10-CM | POA: Diagnosis present

## 2021-08-20 DIAGNOSIS — O9832 Other infections with a predominantly sexual mode of transmission complicating childbirth: Secondary | ICD-10-CM | POA: Diagnosis present

## 2021-08-20 LAB — COMPREHENSIVE METABOLIC PANEL
ALT: 17 U/L (ref 0–44)
AST: 19 U/L (ref 15–41)
Albumin: 3.1 g/dL — ABNORMAL LOW (ref 3.5–5.0)
Alkaline Phosphatase: 139 U/L — ABNORMAL HIGH (ref 38–126)
Anion gap: 9 (ref 5–15)
BUN: 6 mg/dL (ref 6–20)
CO2: 21 mmol/L — ABNORMAL LOW (ref 22–32)
Calcium: 9.6 mg/dL (ref 8.9–10.3)
Chloride: 106 mmol/L (ref 98–111)
Creatinine, Ser: 0.68 mg/dL (ref 0.44–1.00)
GFR, Estimated: >60 mL/min (ref >60)
Glucose, Bld: 73 mg/dL (ref 70–99)
Potassium: 3.9 mmol/L (ref 3.5–5.1)
Sodium: 136 mmol/L (ref 135–145)
Total Bilirubin: 0.4 mg/dL (ref 0.3–1.2)
Total Protein: 6.9 g/dL (ref 6.5–8.1)

## 2021-08-20 LAB — PROTEIN / CREATININE RATIO, URINE
Creatinine, Urine: 50.82 mg/dL
Protein Creatinine Ratio: 0.2 mg/mg{Cre} — ABNORMAL HIGH (ref 0.00–0.15)
Total Protein, Urine: 10 mg/dL

## 2021-08-20 LAB — CBC
HCT: 37.9 % (ref 36.0–46.0)
Hemoglobin: 12 g/dL (ref 12.0–15.0)
MCH: 28.4 pg (ref 26.0–34.0)
MCHC: 31.7 g/dL (ref 30.0–36.0)
MCV: 89.8 fL (ref 80.0–100.0)
Platelets: 191 10*3/uL (ref 150–400)
RBC: 4.22 MIL/uL (ref 3.87–5.11)
RDW: 16.2 % — ABNORMAL HIGH (ref 11.5–15.5)
WBC: 8.4 10*3/uL (ref 4.0–10.5)
nRBC: 0 % (ref 0.0–0.2)

## 2021-08-20 LAB — TYPE AND SCREEN
ABO/RH(D): O POS
Antibody Screen: NEGATIVE

## 2021-08-20 MED ORDER — OXYTOCIN-SODIUM CHLORIDE 30-0.9 UT/500ML-% IV SOLN
1.0000 m[IU]/min | INTRAVENOUS | Status: DC
  Administered 2021-08-22 – 2021-08-23 (×2): 2 m[IU]/min via INTRAVENOUS
  Filled 2021-08-20: qty 500

## 2021-08-20 MED ORDER — LIDOCAINE HCL (PF) 1 % IJ SOLN: 30.0000 mL | INTRAMUSCULAR | Status: DC | PRN

## 2021-08-20 MED ORDER — OXYTOCIN BOLUS FROM INFUSION: 333.0000 mL | Freq: Once | INTRAVENOUS | Status: DC

## 2021-08-20 MED ORDER — SODIUM CHLORIDE 0.9 % IV SOLN
5.0000 10*6.[IU] | Freq: Once | INTRAVENOUS | Status: AC
  Administered 2021-08-21: 09:00:00 5 10*6.[IU] via INTRAVENOUS
  Filled 2021-08-20: qty 5

## 2021-08-20 MED ORDER — MISOPROSTOL 25 MCG QUARTER TABLET: 25.0000 ug | ORAL_TABLET | ORAL | Status: DC | PRN

## 2021-08-20 MED ORDER — OXYTOCIN-SODIUM CHLORIDE 30-0.9 UT/500ML-% IV SOLN: 2.5000 [IU]/h | INTRAVENOUS | Status: DC

## 2021-08-20 MED ORDER — ONDANSETRON HCL 4 MG/2ML IJ SOLN
4.0000 mg | Freq: Four times a day (QID) | INTRAMUSCULAR | Status: DC | PRN
  Administered 2021-08-21 – 2021-08-22 (×2): 4 mg via INTRAVENOUS
  Filled 2021-08-20 (×2): qty 2

## 2021-08-20 MED ORDER — ACETAMINOPHEN 325 MG PO TABS
650.0000 mg | ORAL_TABLET | ORAL | Status: DC | PRN
  Administered 2021-08-21: 18:00:00 650 mg via ORAL
  Filled 2021-08-20: qty 2

## 2021-08-20 MED ORDER — SOD CITRATE-CITRIC ACID 500-334 MG/5ML PO SOLN
30.0000 mL | ORAL | Status: DC | PRN
  Administered 2021-08-21 – 2021-08-23 (×2): 30 mL via ORAL
  Filled 2021-08-20 (×2): qty 30

## 2021-08-20 MED ORDER — OXYCODONE-ACETAMINOPHEN 5-325 MG PO TABS: 2.0000 | ORAL_TABLET | ORAL | Status: DC | PRN

## 2021-08-20 MED ORDER — OXYCODONE-ACETAMINOPHEN 5-325 MG PO TABS: 1.0000 | ORAL_TABLET | ORAL | Status: DC | PRN

## 2021-08-20 MED ORDER — LACTATED RINGERS IV SOLN
500.0000 mL | INTRAVENOUS | Status: DC | PRN
  Administered 2021-08-21: 05:00:00 1000 mL via INTRAVENOUS
  Administered 2021-08-22 – 2021-08-23 (×8): 500 mL via INTRAVENOUS

## 2021-08-20 MED ORDER — TERBUTALINE SULFATE 1 MG/ML IJ SOLN
0.2500 mg | Freq: Once | INTRAMUSCULAR | Status: DC | PRN
  Filled 2021-08-20: qty 1

## 2021-08-20 MED ORDER — PENICILLIN G POT IN DEXTROSE 60000 UNIT/ML IV SOLN
3.0000 10*6.[IU] | INTRAVENOUS | Status: DC
  Administered 2021-08-21 – 2021-08-23 (×9): 3 10*6.[IU] via INTRAVENOUS
  Filled 2021-08-20 (×9): qty 50

## 2021-08-20 MED ORDER — LACTATED RINGERS IV SOLN
INTRAVENOUS | Status: DC
  Administered 2021-08-20 – 2021-08-22 (×4): 950 mL via INTRAVENOUS

## 2021-08-20 MED ORDER — MISOPROSTOL 25 MCG QUARTER TABLET
25.0000 ug | ORAL_TABLET | ORAL | Status: DC | PRN
  Administered 2021-08-20 – 2021-08-21 (×3): 25 ug via ORAL
  Filled 2021-08-20 (×4): qty 1

## 2021-08-20 NOTE — H&P (Signed)
OBSTETRIC ADMISSION HISTORY AND PHYSICAL  Melanie Strong is a 35 y.o. female G2P0010 with IUP at 34w6dpresenting for no FM since last night. Initial intake BP also elevated. No LOF, VB, blurry vision, headaches, peripheral edema, or RUQ pain. She plans on bottlefeeding. She is undecided on birth control.  Dating: By LMP --->  Estimated Date of Delivery: 08/28/21  Sono:    '@[redacted]w[redacted]d' , normal anatomy, ceph presentation, 1616g, 67%ile, EFW 3'9  Prenatal History/Complications: - anemia - GBS carrier - alpha thal carrier- FOB neg - beta thal carrier- FOB neg - SMA carrier- FOB neg - hx HSV, on suppression, last outbreak in February  Past Medical History: Past Medical History:  Diagnosis Date   Anemia    Glucose found in urine on examination    Infection    UTI    Past Surgical History: Past Surgical History:  Procedure Laterality Date   NO PAST SURGERIES      Obstetrical History: OB History     Gravida  2   Para      Term      Preterm      AB  1   Living         SAB  1   IAB      Ectopic      Multiple      Live Births              Social History: Social History   Socioeconomic History   Marital status: Married    Spouse name: Not on file   Number of children: Not on file   Years of education: Not on file   Highest education level: Not on file  Occupational History   Not on file  Tobacco Use   Smoking status: Every Day    Packs/day: 0.50    Years: 10.00    Pack years: 5.00    Types: Cigarettes   Smokeless tobacco: Never  Vaping Use   Vaping Use: Never used  Substance and Sexual Activity   Alcohol use: No    Comment: rare   Drug use: No   Sexual activity: Yes    Birth control/protection: None  Other Topics Concern   Not on file  Social History Narrative   Not on file   Social Determinants of Health   Financial Resource Strain: Not on file  Food Insecurity: Not on file  Transportation Needs: Not on file  Physical Activity:  Not on file  Stress: Not on file  Social Connections: Not on file    Family History: Family History  Problem Relation Age of Onset   Hypertension Mother    Diabetes Mother        Boarderline   Lupus Mother    Cancer Paternal Aunt        brain   Cancer Father        rectal   Sickle cell anemia Sister    Healthy Brother    Healthy Brother    Other Neg Hx     Allergies: No Known Allergies  Medications Prior to Admission  Medication Sig Dispense Refill Last Dose   acetaminophen (TYLENOL) 500 MG tablet Take 500 mg by mouth every 6 (six) hours as needed.   08/20/2021   aspirin EC 81 MG tablet Take 1 tablet (81 mg total) by mouth daily. Swallow whole. 30 tablet 6 08/20/2021   Blood Pressure Monitoring (BLOOD PRESSURE KIT) DEVI 1 kit by Does not apply route as needed.  Large cuff 1 each 0 08/20/2021   docusate sodium (COLACE) 100 MG capsule Take 1 capsule (100 mg total) by mouth 2 (two) times daily. 60 capsule 6 08/20/2021   Iron Polysacch Cmplx-B12-FA 150-0.025-1 MG CAPS Take 1 capsule by mouth every other day. 30 capsule 5 08/20/2021   metoCLOPramide (REGLAN) 10 MG tablet Take 1 tablet (10 mg total) by mouth 3 (three) times daily with meals as needed for nausea. 30 tablet 3 08/20/2021   pantoprazole (PROTONIX) 40 MG tablet Take 1 tablet (40 mg total) by mouth daily. 30 tablet 5 08/20/2021   Prenatal Vit-Fe Fumarate-FA (PRENATAL MULTIVITAMIN) TABS tablet Take 1 tablet by mouth daily at 12 noon.   08/20/2021   promethazine (PHENERGAN) 12.5 MG tablet Take 1-2 tablets (12.5-25 mg total) by mouth at bedtime as needed for nausea or vomiting. 30 tablet 3 08/20/2021   valACYclovir (VALTREX) 500 MG tablet Take 1 tablet (500 mg total) by mouth 2 (two) times daily. 90 tablet 2 08/20/2021     Review of Systems:  All systems reviewed and negative except as stated in HPI  PE: Blood pressure 134/82, pulse 91, temperature 98.7 F (37.1 C), resp. rate 16, last menstrual period 11/21/2020, SpO2  100 %, unknown if currently breastfeeding. Patient Vitals for the past 24 hrs:  BP Temp Pulse Resp SpO2  08/20/21 1816 134/82 -- 91 -- --  08/20/21 1801 (!) 150/99 -- 96 -- --  08/20/21 1747 (!) 139/91 -- 94 -- --  08/20/21 1746 -- 98.7 F (37.1 C) -- 16 100 %   General appearance: alert, cooperative, and no distress Lungs: regular rate and effort Heart: regular rate  Abdomen: soft, non-tender Extremities: Homans sign is negative, no sign of DVT GU: normal externally and internally, no lesions seen Presentation: cephalic by BSUS EFM: 956 bpm, mod variability, + accels, 1 late decel Toco: irregular, mild SVE: closed/thick  Prenatal labs: ABO, Rh: --/--/PENDING (12/19 1813) Antibody: PENDING (12/19 1813) Rubella: 1.01 (06/02 1059) RPR: Non Reactive (09/28 0826)  HBsAg: Negative (06/02 1059)  HIV: Non Reactive (09/28 0826)  GBS: Positive/-- (11/30 0945)  2 hr GTT nml  Prenatal Transfer Tool  Maternal Diabetes: No Genetic Screening: Abnormal:  Results: Other: Maternal Ultrasounds/Referrals: Normal Fetal Ultrasounds or other Referrals:  None Maternal Substance Abuse:  No Significant Maternal Medications:  None Significant Maternal Lab Results: Group B Strep positive  Results for orders placed or performed during the hospital encounter of 08/20/21 (from the past 24 hour(s))  Type and screen   Collection Time: 08/20/21  6:13 PM  Result Value Ref Range   ABO/RH(D) PENDING    Antibody Screen PENDING    Sample Expiration      08/23/2021,2359 Performed at [facility], 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 Thompson Grayer notified  Julianne Handler, [place]  08/20/2021, 6:31 PM

## 2021-08-20 NOTE — Progress Notes (Signed)
Melanie Strong MRN: [id number]  Subjective: -Care assumed of 35 y.o. G2P0010 at [redacted]w[redacted]d who presents for IOL s/t DFM and Elevated BP.  Patient presented to MAU for evaluation and recommendation for induction made.  Pregnancy and medical history significant for HSV, Beta Thalassemia Carrier, Alpha Thalassemia Silent Carrier, GBS Positive, Carrier of SMA, and Anemia.  In room to meet acquaintance of patient. She reports some anxiety with induction process and delivery.  Objective: BP 134/84    Pulse 87    Temp 99.6 F (37.6 C) (Axillary)    Resp 18    Ht 5\' 6"  (1.676 m)    Wt 87.4 kg    LMP 11/21/2020    SpO2 100%    BMI 31.09 kg/m  No intake/output data recorded. No intake/output data recorded.  Vitals:   08/20/21 1854 08/20/21 1920 08/20/21 2041 08/20/21 2135  BP: (!) 144/102 133/82 134/84 136/77  Pulse: (!) 135 94 87 86  Resp: 18     Temp: 99.6 F (37.6 C)     TempSrc: Axillary     SpO2:      Weight:      Height:         Results for orders placed or performed during the hospital encounter of 08/20/21 (from the past 24 hour(s))  CBC     Status: Abnormal   Collection Time: 08/20/21  6:13 PM  Result Value Ref Range   WBC 8.4 4.0 - 10.5 K/uL   RBC 4.22 3.87 - 5.11 MIL/uL   Hemoglobin 12.0 12.0 - 15.0 g/dL   HCT 37.9 36.0 - 46.0 %   MCV 89.8 80.0 - 100.0 fL   MCH 28.4 26.0 - 34.0 pg   MCHC 31.7 30.0 - 36.0 g/dL   RDW 16.2 (H) 11.5 - 15.5 %   Platelets 191 150 - 400 K/uL   nRBC 0.0 0.0 - 0.2 %  Comprehensive metabolic panel     Status: Abnormal   Collection Time: 08/20/21  6:13 PM  Result Value Ref Range   Sodium 136 135 - 145 mmol/L   Potassium 3.9 3.5 - 5.1 mmol/L   Chloride 106 98 - 111 mmol/L   CO2 21 (L) 22 - 32 mmol/L   Glucose, Bld 73 70 - 99 mg/dL   BUN 6 6 - 20 mg/dL   Creatinine, Ser 0.68 0.44 - 1.00 mg/dL   Calcium 9.6 8.9 - 10.3 mg/dL   Total Protein 6.9 6.5 - 8.1 g/dL   Albumin 3.1 (L) 3.5 - 5.0 g/dL   AST 19 15 - 41 U/L   ALT 17 0 - 44 U/L   Alkaline  Phosphatase 139 (H) 38 - 126 U/L   Total Bilirubin 0.4 0.3 - 1.2 mg/dL   GFR, Estimated >60 >60 mL/min   Anion gap 9 5 - 15  Type and screen     Status: None   Collection Time: 08/20/21  6:13 PM  Result Value Ref Range   ABO/RH(D) O POS    Antibody Screen NEG    Sample Expiration      08/23/2021,2359 Performed at [facility], [address], [place], [place] [address]   Protein / creatinine ratio, urine     Status: Abnormal   Collection Time: 08/20/21  6:16 PM  Result Value Ref Range   Creatinine, Urine 50.82 mg/dL   Total Protein, Urine 10 mg/dL   Protein Creatinine Ratio 0.20 (H) 0.00 - 0.15 mg/mg[Cre]     Fetal Monitoring: FHT: 150  bpm, Mod Var, -Decels, +10x10 Accels UC: Irritability Noted    Vaginal Exam: SVE:   Dilation: Closed Exam by:: Donette Larry, CNM Membranes:Intact Internal Monitors: None  Augmentation/Induction: Pitocin:None Cytotec: Initial Dose Given  Assessment:  IUP at 38.6 weeks Cat I FT  DFM Elevated BP  Plan: -Discussed anxiety related to induction and delivery process. -Discussed r/b of induction including fetal distress, serial induction, pain, and increased risk of c/s delivery -Discussed induction methods including cervical ripening agents, foley bulbs, and pitocin -Discussed pain management and medication options throughout process.   Addressed fears related to fentanyl usage.   -Patient okay to eat one hour after cytotec dosing, but will be NPO after midnight.  -No other questions or concerns.  Reports some decrease in anxiety. -Will continue to monitor.   Valma Cava, CNM Advanced Practice Provider, [facility] 08/20/2021, 8:59 PM

## 2021-08-20 NOTE — MAU Note (Signed)
Pt last felt the baby move last night.   Pt also reports swollen hands.   Denies vaginal bleeding. Denies LOF.   Reports underwear is sometimes wet, but does not think her water broke.

## 2021-08-21 LAB — RESP PANEL BY RT-PCR (FLU A&B, COVID) ARPGX2
Influenza A by PCR: NEGATIVE
Influenza B by PCR: NEGATIVE
SARS Coronavirus 2 by RT PCR: NEGATIVE

## 2021-08-21 LAB — RPR: RPR Ser Ql: NONREACTIVE

## 2021-08-21 MED ORDER — ZOLPIDEM TARTRATE 5 MG PO TABS
5.0000 mg | ORAL_TABLET | Freq: Every evening | ORAL | Status: DC | PRN
  Administered 2021-08-21: 03:00:00 5 mg via ORAL
  Filled 2021-08-21: qty 1

## 2021-08-21 MED ORDER — FENTANYL CITRATE (PF) 100 MCG/2ML IJ SOLN
INTRAMUSCULAR | Status: AC
  Administered 2021-08-21: 07:00:00 50 ug via INTRAVENOUS
  Filled 2021-08-21: qty 2

## 2021-08-21 MED ORDER — FENTANYL CITRATE (PF) 100 MCG/2ML IJ SOLN
100.0000 ug | INTRAMUSCULAR | Status: DC | PRN
  Administered 2021-08-21 – 2021-08-22 (×13): 100 ug via INTRAVENOUS
  Filled 2021-08-21 (×12): qty 2

## 2021-08-21 MED ORDER — FENTANYL CITRATE (PF) 100 MCG/2ML IJ SOLN
INTRAMUSCULAR | Status: AC
  Filled 2021-08-21: qty 2

## 2021-08-21 MED ORDER — SODIUM CHLORIDE 0.9 % IV SOLN
12.5000 mg | Freq: Once | INTRAVENOUS | Status: AC
  Administered 2021-08-21: 16:00:00 12.5 mg via INTRAVENOUS
  Filled 2021-08-21: qty 12.5

## 2021-08-21 MED ORDER — FENTANYL CITRATE (PF) 100 MCG/2ML IJ SOLN: 50.0000 ug | Freq: Once | INTRAMUSCULAR | Status: AC

## 2021-08-21 MED ORDER — MISOPROSTOL 50MCG HALF TABLET
50.0000 ug | ORAL_TABLET | ORAL | Status: DC | PRN
  Administered 2021-08-21 (×2): 50 ug via BUCCAL
  Filled 2021-08-21 (×2): qty 1

## 2021-08-21 MED ORDER — NALBUPHINE HCL 10 MG/ML IJ SOLN
10.0000 mg | Freq: Once | INTRAMUSCULAR | Status: AC
  Administered 2021-08-21: 15:00:00 10 mg via INTRAVENOUS
  Filled 2021-08-21: qty 1

## 2021-08-21 NOTE — Progress Notes (Signed)
Melanie Strong [id number]  Subjective: Strip and Chart Reviewed.  Patient s/p 2nd dose Cytotec.  Requesting medication for sleep.   Objective:  Vitals:   08/20/21 2041 08/20/21 2135 08/20/21 2235 08/21/21 0001  BP: 134/84 136/77 128/72 131/79  Pulse: 87 86 92 96  Resp:      Temp:      TempSrc:      SpO2:      Weight:      Height:        FHR: 145 bpm, Mod Var, -Decels, -Accels UC: Q2-62min  Assessment: IUP at [redacted]w[redacted]d Cat I FT IOL  Plan: -Ambien ordered -Position change as appropriate -Continue other mgmt as noted.  Sabas Sous, CNM 08/21/2021 3:27 AM

## 2021-08-21 NOTE — Progress Notes (Signed)
Melanie Strong is a 35 y.o. G2P0010 at [redacted]w[redacted]d   Subjective: Pain score improved with removal of foley bulb  Objective: BP 127/90    Pulse 82    Temp 98.2 F (36.8 C) (Oral)    Resp 16    Ht 5\' 6"  (1.676 m)    Wt 87.4 kg    LMP 11/21/2020    SpO2 100%    BMI 31.09 kg/m  I/O last 3 completed shifts: In: 3536.8 [P.O.:540; I.V.:2976; IV Piggyback:20.8] Out: -  No intake/output data recorded.  FHT:  FHR: 130 bpm, variability: moderate,  accelerations:  Present,  decelerations:  Absent UC:   irregular, every 2-5 minutes SVE:   Dilation: 1 Effacement (%): 30 Station: -3 Exam by:: sam, CNM  Labs: Lab Results  Component Value Date   WBC 8.4 08/20/2021   HGB 12.0 08/20/2021   HCT 37.9 08/20/2021   MCV 89.8 08/20/2021   PLT 191 08/20/2021    Assessment / Plan: --35 y.o. G2P0010 at [redacted]w[redacted]d  --Cat I prior to IV Fentanyl --Foley bulb removed per patient tolerance --Continue Cytotec q 4 hours --Consider early epidural and replacement foley with speculum if poor response to Cytotec alone and if patient consents --Anticipate vaginal delivery  [redacted]w[redacted]d, CNM 08/21/2021,1730

## 2021-08-21 NOTE — Progress Notes (Signed)
Melanie Strong is a 35 y.o. G2P0010 at [redacted]w[redacted]d   Subjective: Tearful, endorsing worsening contractions, requesting pain medication, s/p IV Fentanyl and still very uncomfortable  Objective: BP 128/69    Pulse 70    Temp 98.5 F (36.9 C) (Oral)    Resp 16    Ht 5\' 6"  (1.676 m)    Wt 87.4 kg    LMP 11/21/2020    SpO2 100%    BMI 31.09 kg/m  No intake/output data recorded. Total I/O In: 2194.5 [P.O.:60; I.V.:2134.5] Out: -   FHT:  FHR: 135 bpm, variability: moderate,  accelerations:  Present,  decelerations:  Absent UC:   irregular, every 1-4 minutes SVE:   Dilation: Fingertip Effacement (%): 30 Station: -3 Exam by:: [email or web address], CNM  Labs: Lab Results  Component Value Date   WBC 8.4 08/20/2021   HGB 12.0 08/20/2021   HCT 37.9 08/20/2021   MCV 89.8 08/20/2021   PLT 191 08/20/2021    Assessment / Plan: --Foley bulb deflated (60 mL) per patient request --1/50/-3, cervix remains very posterior --Patient understandably declines repeat foley placement --Will continue Cytotec q 4 hours --Assess for Pitocin titration with future checks --Anticipate vaginal delivery  07-13-1970, CNM 08/21/2021, 3:11 PM

## 2021-08-21 NOTE — Progress Notes (Signed)
Melanie Strong MRN: [id number]  Subjective: -Patient resting in bed.  Reports some mild contractions, but coping well. SO at bedside, supportive.   Objective: BP 127/83    Pulse 81    Temp 99.6 F (37.6 C) (Axillary)    Resp 18    Ht 5\' 6"  (1.676 m)    Wt 87.4 kg    LMP 11/21/2020    SpO2 100%    BMI 31.09 kg/m  No intake/output data recorded. No intake/output data recorded.  Fetal Monitoring: FHT: 145 bpm, Mod Var, -Decels, -Accels UC: Q1-72min    Vaginal Exam: SVE:   Dilation: Fingertip Effacement (%): 90 Station: -3 Exam by:: [email or web address], CNM Membranes:Intact Internal Monitors: None  Augmentation/Induction: Pitocin:None Cytotec: S/P 3 Doses Foley Bulb inserted with speculum and ring forceps.  Instilled with ~29mL fluid.   Assessment:  IUP at 39.1 weeks Cat I FT  IOL    Plan: -Discussed foley bulb insertion including r/b, placement, associated discomfort, initiation of pitocin, and removal. -Patient agreeable and without questions. -FB inserted without issues.  -Patient requests and given pain medication. -Continue other mgmt as ordered. -L&D Team to be updated on patient status.    72m, CNM Advanced Practice Provider, Center for [facility] 08/21/2021, 6:59 AM

## 2021-08-21 NOTE — Progress Notes (Addendum)
S: Patient having left side pain even after position changes. IV pain meds helps, but not lasting long. Significant other at bedside. Expecting baby girl Kei'Lanie.   O: Vitals:   08/21/21 1500 08/21/21 1600 08/21/21 1700 08/21/21 1952  BP: 135/82 135/84 127/90 123/79  Pulse: 67 75 82 79  Resp: 16 16 16 18   Temp: 98.2 F (36.8 C)   98.2 F (36.8 C)  TempSrc: Oral   Oral  SpO2:      Weight:      Height:        FHT:  FHR: 150 bpm, variability: moderate,  accelerations:  Present,  decelerations:  Present prolonged  UC:   irregular, every 1-4 minutes SVE:   Dilation: 1 Effacement (%): 30 Station: -3 Exam by:: [email or web address], RN  A / P: 35 y.o. G2P0 at [redacted]w[redacted]d Induction of labor due to cHTN (no meds),  cervix unchanged with Cytotec for cervical ripening after failed foley placement, removed per patient request due to pain.   Consider early epidural to reattempt foley balloon placement and patient agrees to the possible plan.  IV Fentanyl for pain GBS positive- PCN Fetal Wellbeing:  Category II Pain Control plan:  Epidural and IV pain meds Anticipated MOD:  NSVD  [redacted]w[redacted]d, SNM 08/21/2021, 9:30 PM   Attestation:  I confirm that I have verified the information documented in the  SNM s note and that I have also personally reperformed the physical exam and all medical decision making activities.   Discussed option of proceeding iwthout Foley, Getting early Epidural then placing foley (OK per Dr 08/23/2021), or inserting foley now.  She will think about it  Macon Large, CNM

## 2021-08-22 ENCOUNTER — Encounter: Payer: BC Managed Care – PPO | Admitting: Women's Health

## 2021-08-22 ENCOUNTER — Inpatient Hospital Stay (HOSPITAL_COMMUNITY): Payer: BC Managed Care – PPO | Admitting: Anesthesiology

## 2021-08-22 LAB — CBC WITH DIFFERENTIAL/PLATELET
Abs Immature Granulocytes: 0.07 10*3/uL (ref 0.00–0.07)
Basophils Absolute: 0 10*3/uL (ref 0.0–0.1)
Basophils Relative: 0 %
Eosinophils Absolute: 0.1 10*3/uL (ref 0.0–0.5)
Eosinophils Relative: 1 %
HCT: 35.8 % — ABNORMAL LOW (ref 36.0–46.0)
Hemoglobin: 11.8 g/dL — ABNORMAL LOW (ref 12.0–15.0)
Immature Granulocytes: 1 %
Lymphocytes Relative: 14 %
Lymphs Abs: 1.1 10*3/uL (ref 0.7–4.0)
MCH: 29 pg (ref 26.0–34.0)
MCHC: 33 g/dL (ref 30.0–36.0)
MCV: 88 fL (ref 80.0–100.0)
Monocytes Absolute: 1 10*3/uL (ref 0.1–1.0)
Monocytes Relative: 13 %
Neutro Abs: 5.4 10*3/uL (ref 1.7–7.7)
Neutrophils Relative %: 71 %
Platelets: 185 10*3/uL (ref 150–400)
RBC: 4.07 MIL/uL (ref 3.87–5.11)
RDW: 16 % — ABNORMAL HIGH (ref 11.5–15.5)
WBC: 7.7 10*3/uL (ref 4.0–10.5)
nRBC: 0 % (ref 0.0–0.2)

## 2021-08-22 MED ORDER — EPHEDRINE 5 MG/ML INJ: 10.0000 mg | INTRAVENOUS | Status: DC | PRN

## 2021-08-22 MED ORDER — PHENYLEPHRINE 40 MCG/ML (10ML) SYRINGE FOR IV PUSH (FOR BLOOD PRESSURE SUPPORT): 80.0000 ug | PREFILLED_SYRINGE | INTRAVENOUS | Status: DC | PRN

## 2021-08-22 MED ORDER — LACTATED RINGERS IV SOLN
500.0000 mL | Freq: Once | INTRAVENOUS | Status: AC
  Administered 2021-08-22: 07:00:00 500 mL via INTRAVENOUS

## 2021-08-22 MED ORDER — TERBUTALINE SULFATE 1 MG/ML IJ SOLN
0.2500 mg | Freq: Once | INTRAMUSCULAR | Status: AC | PRN
  Administered 2021-08-23: 01:00:00 0.25 mg via SUBCUTANEOUS

## 2021-08-22 MED ORDER — DIPHENHYDRAMINE HCL 50 MG/ML IJ SOLN: 12.5000 mg | INTRAMUSCULAR | Status: DC | PRN

## 2021-08-22 MED ORDER — OXYTOCIN-SODIUM CHLORIDE 30-0.9 UT/500ML-% IV SOLN
1.0000 m[IU]/min | INTRAVENOUS | Status: DC
Start: 2021-08-22 — End: 2021-08-22

## 2021-08-22 MED ORDER — LIDOCAINE-EPINEPHRINE (PF) 2 %-1:200000 IJ SOLN
INTRAMUSCULAR | Status: DC | PRN
  Administered 2021-08-22 – 2021-08-23 (×2): 5 mL via EPIDURAL
  Administered 2021-08-23: 6 mL via EPIDURAL
  Administered 2021-08-23: 10 mL via EPIDURAL

## 2021-08-22 MED ORDER — FENTANYL-BUPIVACAINE-NACL 0.5-0.125-0.9 MG/250ML-% EP SOLN
12.0000 mL/h | EPIDURAL | Status: DC | PRN
  Administered 2021-08-22 – 2021-08-23 (×2): 12 mL/h via EPIDURAL
  Filled 2021-08-22: qty 250

## 2021-08-22 MED ORDER — EPHEDRINE 5 MG/ML INJ
10.0000 mg | INTRAVENOUS | Status: DC | PRN
  Administered 2021-08-22: 08:00:00 10 mg via INTRAVENOUS

## 2021-08-22 MED ORDER — PHENYLEPHRINE 40 MCG/ML (10ML) SYRINGE FOR IV PUSH (FOR BLOOD PRESSURE SUPPORT)
80.0000 ug | PREFILLED_SYRINGE | INTRAVENOUS | Status: AC | PRN
  Administered 2021-08-22 (×3): 80 ug via INTRAVENOUS

## 2021-08-22 NOTE — Progress Notes (Signed)
Labor Progress Note Melanie Strong is a 35 y.o. G2P0010 at [redacted]w[redacted]d presented for IOL for decreased FM and elevated BP.  S: Feeling some pressure, comfortable with epidural. Sitting in throne eating italian ice  O:  BP (!) 116/96    Pulse 81    Temp 98.4 F (36.9 C) (Oral)    Resp 19    Ht 5\' 6"  (1.676 m)    Wt 87.4 kg    LMP 11/21/2020    SpO2 (!) 81%    BMI 31.09 kg/m   FHT: FHR 150, moderate variability (>6bpm), + accels, no decels UC q 1-3 minutes via IUPC  CVE: Dilation: 4.5 Effacement (%): 90 Cervical Position: Posterior Station: -1, -2 Presentation: Vertex Exam by:: Dr. [email or web address]   A&P: 35 y.o. G2P0010 [redacted]w[redacted]d  #Labor: Progressing well. Cervix much softer and thinned out from last check. Goal is to maintain adequate MVU with uptitration of pitocin, currently at 14 milliunits/minute. Fetal status reassuring with moderate variability. Will continue current plan of care and re-check in a few hours #Pain: Epidural #FWB: Cat 1 #GBS positive > PCN per protocol  [redacted]w[redacted]d, DO 9:11 PM

## 2021-08-22 NOTE — Anesthesia Preprocedure Evaluation (Signed)
Anesthesia Evaluation  Patient identified by MRN, date of birth, ID band Patient awake    Reviewed: Allergy & Precautions, NPO status , Patient's Chart, lab work & pertinent test results  Airway Mallampati: II  TM Distance: >3 FB Neck ROM: Full    Dental no notable dental hx.    Pulmonary neg pulmonary ROS, Current Smoker,    Pulmonary exam normal breath sounds clear to auscultation       Cardiovascular hypertension (gHTN), Normal cardiovascular exam Rhythm:Regular Rate:Normal     Neuro/Psych negative neurological ROS  negative psych ROS   GI/Hepatic negative GI ROS, Neg liver ROS,   Endo/Other  negative endocrine ROS  Renal/GU negative Renal ROS  negative genitourinary   Musculoskeletal negative musculoskeletal ROS (+)   Abdominal   Peds  Hematology negative hematology ROS (+)   Anesthesia Other Findings   Reproductive/Obstetrics (+) Pregnancy                             Anesthesia Physical Anesthesia Plan  ASA: 3  Anesthesia Plan: Epidural   Post-op Pain Management:    Induction:   PONV Risk Score and Plan: Treatment may vary due to age or medical condition  Airway Management Planned: Natural Airway  Additional Equipment:   Intra-op Plan:   Post-operative Plan:   Informed Consent: I have reviewed the patients History and Physical, chart, labs and discussed the procedure including the risks, benefits and alternatives for the proposed anesthesia with the patient or authorized representative who has indicated his/her understanding and acceptance.       Plan Discussed with: Anesthesiologist  Anesthesia Plan Comments: (Patient identified. Risks, benefits, options discussed with patient including but not limited to bleeding, infection, nerve damage, paralysis, failed block, incomplete pain control, headache, blood pressure changes, nausea, vomiting, reactions to medication,  itching, and post partum back pain. Confirmed with bedside nurse the patient's most recent platelet count. Confirmed with the patient that they are not taking any anticoagulation, have any bleeding history or any family history of bleeding disorders. Patient expressed understanding and wishes to proceed. All questions were answered. )        Anesthesia Quick Evaluation

## 2021-08-22 NOTE — Plan of Care (Signed)

## 2021-08-22 NOTE — Progress Notes (Signed)
Patient ID: [name], female   DOB: 09-27-1985, 35 y.o.   MRN: [id number] Doing well Not feeling contractions, only pain in left mid abdomen  Vitals:   08/21/21 2329 08/22/21 0002 08/22/21 0019 08/22/21 0101  BP: 133/81 (!) 147/76 128/73 136/86  Pulse: 77 97 70 82  Resp: 19     Temp: 98.2 F (36.8 C)     TempSrc: Oral     SpO2:      Weight:      Height:       FHR reactive UCs q57min  Dilation: 1.5 Effacement (%): 60 Cervical Position: Posterior Station: -3 Presentation: Vertex Exam by:: Wynelle Bourgeois, CNM  Foley/Cook Catheter inserted, uterine balloon inflated  WIll start Pitocin per Low dose protocol

## 2021-08-22 NOTE — Progress Notes (Signed)
Reviewed strip at request of RN Judeth Cornfield.   Patient is currently on Pit of 10 with regular contraction pattern, MVU 170.  RN reports possible subtle prolonged HR decelerations  Reviewed section of FHT below in OBIX. Fetal status is reassuring with moderate variability and heart rate > 100. Resolved spontaneously without intervention within 4 minutes.   Continue current plan of care with IOL

## 2021-08-22 NOTE — Telephone Encounter (Signed)
Called and spoke with patient re: elective induction at 39 weeks. Patient advised that this would be dependent on her cervix, which would be checked at next visit. Patient advised she can call the office to see if there are sooner appointments to be seen for a cervix check. Discussed issues with elective induction without favorable cervix. Also discussed that elective inductions without a medical reason may be pushed for inductions with a medical reason. Patient advised to start cervical ripening exercises Patient verbalizes understanding and agrees with plan.  Marylen Ponto, NP  8:43 AM 08/22/2021

## 2021-08-22 NOTE — Anesthesia Procedure Notes (Signed)
Epidural Patient location during procedure: OB Start time: 08/22/2021 6:55 AM End time: 08/22/2021 7:05 AM  Staffing Anesthesiologist: Elmer Picker, MD Performed: anesthesiologist   Preanesthetic Checklist Completed: patient identified, IV checked, risks and benefits discussed, monitors and equipment checked, pre-op evaluation and timeout performed  Epidural Patient position: sitting Prep: DuraPrep and site prepped and draped Patient monitoring: continuous pulse ox, blood pressure, heart rate and cardiac monitor Approach: midline Location: L3-L4 Injection technique: LOR air  Needle:  Needle type: Tuohy  Needle gauge: 17 G Needle length: 9 cm Needle insertion depth: 6 cm Catheter type: closed end flexible Catheter size: 19 Gauge Catheter at skin depth: 11 cm Test dose: negative  Assessment Sensory level: T8 Events: blood not aspirated, injection not painful, no injection resistance, no paresthesia and negative IV test  Additional Notes Patient identified. Risks/Benefits/Options discussed with patient including but not limited to bleeding, infection, nerve damage, paralysis, failed block, incomplete pain control, headache, blood pressure changes, nausea, vomiting, reactions to medication both or allergic, itching and postpartum back pain. Confirmed with bedside nurse the patient's most recent platelet count. Confirmed with patient that they are not currently taking any anticoagulation, have any bleeding history or any family history of bleeding disorders. Patient expressed understanding and wished to proceed. All questions were answered. Sterile technique was used throughout the entire procedure. Please see nursing notes for vital signs. Test dose was given through epidural catheter and negative prior to continuing to dose epidural or start infusion. Warning signs of high block given to the patient including shortness of breath, tingling/numbness in hands, complete motor block,  or any concerning symptoms with instructions to call for help. Patient was given instructions on fall risk and not to get out of bed. All questions and concerns addressed with instructions to call with any issues or inadequate analgesia.  Reason for block:procedure for pain

## 2021-08-22 NOTE — Progress Notes (Signed)
Patient ID: Melanie Strong, female   DOB: [birth year], 35 y.o.   MRN: [id number]  Comfortable w epidural  BP 101/61, P 82 FHR 150s, +accels, avg LTV, occ variables/decels Ctx difficult to trace with Pit @ 2 mu/min and recently upped to 70mu/min Cx 4+/60/vtx -2; IUPC inserted without difficulty  IUP@39 .1wks gHTN IOL process  -Titrate Pit to achieve adequate MVUs, and plan for cx exam 2hrs after this -Anticipate vag del  Melanie Strong CNM 08/22/2021 2:45 PM

## 2021-08-22 NOTE — Progress Notes (Signed)
Patient ID: Melanie Strong, female   DOB: 12/06/1985, 35 y.o.   MRN: [id number] Just got epidural Vitals:   08/22/21 0726 08/22/21 0731 08/22/21 0736 08/22/21 0741  BP: 120/81 121/66 133/65 128/67  Pulse: 85 65 88 94  Resp:      Temp:      TempSrc:      SpO2:      Weight:      Height:       Just had another prolonged variable decel Baseline variability good and + accels  Will watch

## 2021-08-22 NOTE — Progress Notes (Signed)
Patient ID: Melanie Strong, female   DOB: 1986/05/25, 35 y.o.   MRN: [id number]  Comfortable w epidural; s/p cervical foley with Pit @ 68mu/min  BP 122/69, P 81 FHR 150s, +accels, occ mi variables Ctx q 2-5 mins Cx 4/50/vtx -3  IUP@39 .1wks gHTN IOL process  Keep ctx reg with Pitocin Plan cx check in approx 4hrs or sooner prn  Arabella Merles [facility] 08/22/2021

## 2021-08-23 ENCOUNTER — Encounter (HOSPITAL_COMMUNITY): Payer: Self-pay | Admitting: Obstetrics & Gynecology

## 2021-08-23 ENCOUNTER — Encounter (HOSPITAL_COMMUNITY)
Admission: AD | Disposition: A | Discharge status: Home / Self Care | Payer: Self-pay | Source: Home / Self Care | Attending: Family Medicine

## 2021-08-23 DIAGNOSIS — O1414 Severe pre-eclampsia complicating childbirth: Secondary | ICD-10-CM

## 2021-08-23 DIAGNOSIS — O368130 Decreased fetal movements, third trimester, not applicable or unspecified: Secondary | ICD-10-CM

## 2021-08-23 DIAGNOSIS — Z3A39 39 weeks gestation of pregnancy: Secondary | ICD-10-CM

## 2021-08-23 DIAGNOSIS — O99820 Streptococcus B carrier state complicating pregnancy: Secondary | ICD-10-CM

## 2021-08-23 LAB — COMPREHENSIVE METABOLIC PANEL
ALT: 12 U/L (ref 0–44)
AST: 18 U/L (ref 15–41)
Albumin: 2.1 g/dL — ABNORMAL LOW (ref 3.5–5.0)
Alkaline Phosphatase: 131 U/L — ABNORMAL HIGH (ref 38–126)
Anion gap: 10 (ref 5–15)
BUN: 7 mg/dL (ref 6–20)
CO2: 20 mmol/L — ABNORMAL LOW (ref 22–32)
Calcium: 8.2 mg/dL — ABNORMAL LOW (ref 8.9–10.3)
Chloride: 104 mmol/L (ref 98–111)
Creatinine, Ser: 2.64 mg/dL — ABNORMAL HIGH (ref 0.44–1.00)
GFR, Estimated: 24 mL/min — ABNORMAL LOW (ref >60)
Glucose, Bld: 85 mg/dL (ref 70–99)
Potassium: 3.6 mmol/L (ref 3.5–5.1)
Sodium: 134 mmol/L — ABNORMAL LOW (ref 135–145)
Total Bilirubin: 1 mg/dL (ref 0.3–1.2)
Total Protein: 5.2 g/dL — ABNORMAL LOW (ref 6.5–8.1)

## 2021-08-23 LAB — CBC
HCT: 31.7 % — ABNORMAL LOW (ref 36.0–46.0)
Hemoglobin: 10 g/dL — ABNORMAL LOW (ref 12.0–15.0)
MCH: 27.9 pg (ref 26.0–34.0)
MCHC: 31.5 g/dL (ref 30.0–36.0)
MCV: 88.5 fL (ref 80.0–100.0)
Platelets: 153 10*3/uL (ref 150–400)
RBC: 3.58 MIL/uL — ABNORMAL LOW (ref 3.87–5.11)
RDW: 15.9 % — ABNORMAL HIGH (ref 11.5–15.5)
WBC: 8.3 10*3/uL (ref 4.0–10.5)
nRBC: 0 % (ref 0.0–0.2)

## 2021-08-23 LAB — PROTEIN / CREATININE RATIO, URINE
Creatinine, Urine: 40.77 mg/dL
Protein Creatinine Ratio: 2.01 mg/mg{Cre} — ABNORMAL HIGH (ref 0.00–0.15)
Total Protein, Urine: 82 mg/dL

## 2021-08-23 LAB — MAGNESIUM: Magnesium: 3.7 mg/dL — ABNORMAL HIGH (ref 1.7–2.4)

## 2021-08-23 SURGERY — Surgical Case
Anesthesia: Epidural

## 2021-08-23 MED ORDER — FENTANYL CITRATE (PF) 100 MCG/2ML IJ SOLN
25.0000 ug | INTRAMUSCULAR | Status: DC | PRN
  Administered 2021-08-23 (×2): 50 ug via INTRAVENOUS

## 2021-08-23 MED ORDER — SCOPOLAMINE 1 MG/3DAYS TD PT72
MEDICATED_PATCH | TRANSDERMAL | Status: DC | PRN
  Administered 2021-08-23: 1 via TRANSDERMAL

## 2021-08-23 MED ORDER — MAGNESIUM SULFATE 40 GM/1000ML IV SOLN
INTRAVENOUS | Status: AC
  Filled 2021-08-23: qty 1000

## 2021-08-23 MED ORDER — SODIUM CHLORIDE 0.9 % IR SOLN
Status: DC | PRN
  Administered 2021-08-23: 1

## 2021-08-23 MED ORDER — TETANUS-DIPHTH-ACELL PERTUSSIS 5-2.5-18.5 LF-MCG/0.5 IM SUSY: 0.5000 mL | PREFILLED_SYRINGE | Freq: Once | INTRAMUSCULAR | Status: DC

## 2021-08-23 MED ORDER — SENNOSIDES-DOCUSATE SODIUM 8.6-50 MG PO TABS
2.0000 | ORAL_TABLET | Freq: Every day | ORAL | Status: DC
  Administered 2021-08-24: 10:00:00 2 via ORAL
  Filled 2021-08-23: qty 2

## 2021-08-23 MED ORDER — LACTATED RINGERS AMNIOINFUSION: INTRAVENOUS | Status: DC

## 2021-08-23 MED ORDER — TRANEXAMIC ACID-NACL 1000-0.7 MG/100ML-% IV SOLN
INTRAVENOUS | Status: DC | PRN
  Administered 2021-08-23: 1000 mg via INTRAVENOUS

## 2021-08-23 MED ORDER — SCOPOLAMINE 1 MG/3DAYS TD PT72: 1.0000 | MEDICATED_PATCH | Freq: Once | TRANSDERMAL | Status: DC

## 2021-08-23 MED ORDER — MAGNESIUM SULFATE 40 GM/1000ML IV SOLN: 1.0000 g/h | INTRAVENOUS | Status: AC

## 2021-08-23 MED ORDER — WITCH HAZEL-GLYCERIN EX PADS: 1.0000 "application " | MEDICATED_PAD | CUTANEOUS | Status: DC | PRN

## 2021-08-23 MED ORDER — MORPHINE SULFATE (PF) 0.5 MG/ML IJ SOLN
INTRAMUSCULAR | Status: AC
  Filled 2021-08-23: qty 10

## 2021-08-23 MED ORDER — FENTANYL CITRATE (PF) 100 MCG/2ML IJ SOLN
INTRAMUSCULAR | Status: AC
  Filled 2021-08-23: qty 2

## 2021-08-23 MED ORDER — OXYCODONE HCL 5 MG PO TABS
5.0000 mg | ORAL_TABLET | ORAL | Status: DC | PRN
  Administered 2021-08-24 (×3): 5 mg via ORAL
  Filled 2021-08-23 (×3): qty 1

## 2021-08-23 MED ORDER — LABETALOL HCL 5 MG/ML IV SOLN: 80.0000 mg | INTRAVENOUS | Status: DC | PRN

## 2021-08-23 MED ORDER — ONDANSETRON HCL 4 MG/2ML IJ SOLN
INTRAMUSCULAR | Status: AC
  Filled 2021-08-23: qty 2

## 2021-08-23 MED ORDER — KETOROLAC TROMETHAMINE 30 MG/ML IJ SOLN: 30.0000 mg | Freq: Four times a day (QID) | INTRAMUSCULAR | Status: DC | PRN

## 2021-08-23 MED ORDER — LABETALOL HCL 5 MG/ML IV SOLN: 20.0000 mg | INTRAVENOUS | Status: DC | PRN

## 2021-08-23 MED ORDER — DEXTROSE 5 % IV SOLN
1000.0000 mg | Freq: Once | INTRAVENOUS | Status: AC
  Administered 2021-08-23: 13:00:00 500 mg via INTRAVENOUS
  Filled 2021-08-23: qty 10

## 2021-08-23 MED ORDER — TRANEXAMIC ACID-NACL 1000-0.7 MG/100ML-% IV SOLN
INTRAVENOUS | Status: AC
  Filled 2021-08-23: qty 100

## 2021-08-23 MED ORDER — SODIUM CHLORIDE 0.9 % IV SOLN
INTRAVENOUS | Status: AC
  Filled 2021-08-23: qty 5

## 2021-08-23 MED ORDER — DIPHENHYDRAMINE HCL 25 MG PO CAPS
25.0000 mg | ORAL_CAPSULE | ORAL | Status: DC | PRN
  Administered 2021-08-24: 04:00:00 25 mg via ORAL

## 2021-08-23 MED ORDER — PRENATAL MULTIVITAMIN CH
1.0000 | ORAL_TABLET | Freq: Every day | ORAL | Status: DC
  Administered 2021-08-24: 12:00:00 1 via ORAL
  Filled 2021-08-23: qty 1

## 2021-08-23 MED ORDER — ENOXAPARIN SODIUM 40 MG/0.4ML IJ SOSY
40.0000 mg | PREFILLED_SYRINGE | INTRAMUSCULAR | Status: DC
  Administered 2021-08-24 – 2021-08-25 (×2): 40 mg via SUBCUTANEOUS
  Filled 2021-08-23 (×2): qty 0.4

## 2021-08-23 MED ORDER — MISOPROSTOL 200 MCG PO TABS
ORAL_TABLET | ORAL | Status: AC
  Filled 2021-08-23: qty 4

## 2021-08-23 MED ORDER — ONDANSETRON HCL 4 MG/2ML IJ SOLN
4.0000 mg | Freq: Three times a day (TID) | INTRAMUSCULAR | Status: DC | PRN
  Administered 2021-08-24: 16:00:00 4 mg via INTRAVENOUS
  Filled 2021-08-23: qty 2

## 2021-08-23 MED ORDER — LACTATED RINGERS IV SOLN: INTRAVENOUS | Status: DC

## 2021-08-23 MED ORDER — ACETAMINOPHEN 10 MG/ML IV SOLN
INTRAVENOUS | Status: DC | PRN
  Administered 2021-08-23: 1000 mg via INTRAVENOUS

## 2021-08-23 MED ORDER — CEFAZOLIN SODIUM-DEXTROSE 2-4 GM/100ML-% IV SOLN: 2.0000 g | Freq: Once | INTRAVENOUS | Status: DC

## 2021-08-23 MED ORDER — HYDRALAZINE HCL 20 MG/ML IJ SOLN: 10.0000 mg | INTRAMUSCULAR | Status: DC | PRN

## 2021-08-23 MED ORDER — LABETALOL HCL 5 MG/ML IV SOLN: 40.0000 mg | INTRAVENOUS | Status: DC | PRN

## 2021-08-23 MED ORDER — FENTANYL CITRATE (PF) 100 MCG/2ML IJ SOLN
INTRAMUSCULAR | Status: DC | PRN
  Administered 2021-08-23: 13:00:00 100 ug via INTRAVENOUS

## 2021-08-23 MED ORDER — MORPHINE SULFATE (PF) 0.5 MG/ML IJ SOLN
INTRAMUSCULAR | Status: DC | PRN
  Administered 2021-08-23: 3 mg via EPIDURAL

## 2021-08-23 MED ORDER — OXYTOCIN-SODIUM CHLORIDE 30-0.9 UT/500ML-% IV SOLN: 2.5000 [IU]/h | INTRAVENOUS | Status: AC

## 2021-08-23 MED ORDER — CHLOROPROCAINE HCL (PF) 3 % IJ SOLN
INTRAMUSCULAR | Status: DC | PRN
  Administered 2021-08-23: 20 mL

## 2021-08-23 MED ORDER — DEXAMETHASONE SODIUM PHOSPHATE 4 MG/ML IJ SOLN
INTRAMUSCULAR | Status: DC | PRN
  Administered 2021-08-23: 10 mg via INTRAVENOUS

## 2021-08-23 MED ORDER — ACETAMINOPHEN 10 MG/ML IV SOLN: 1000.0000 mg | Freq: Once | INTRAVENOUS | Status: DC | PRN

## 2021-08-23 MED ORDER — GABAPENTIN 100 MG PO CAPS
100.0000 mg | ORAL_CAPSULE | Freq: Every day | ORAL | Status: DC
  Administered 2021-08-23: 22:00:00 100 mg via ORAL
  Filled 2021-08-23: qty 1

## 2021-08-23 MED ORDER — ACETAMINOPHEN 500 MG PO TABS
1000.0000 mg | ORAL_TABLET | Freq: Four times a day (QID) | ORAL | Status: DC
  Administered 2021-08-24 (×3): 1000 mg via ORAL
  Filled 2021-08-23 (×4): qty 2

## 2021-08-23 MED ORDER — MAGNESIUM HYDROXIDE 400 MG/5ML PO SUSP: 30.0000 mL | ORAL | Status: DC | PRN

## 2021-08-23 MED ORDER — SIMETHICONE 80 MG PO CHEW
80.0000 mg | CHEWABLE_TABLET | Freq: Three times a day (TID) | ORAL | Status: DC
  Administered 2021-08-24: 08:00:00 80 mg via ORAL
  Filled 2021-08-23 (×2): qty 1

## 2021-08-23 MED ORDER — DEXMEDETOMIDINE (PRECEDEX) IN NS 20 MCG/5ML (4 MCG/ML) IV SYRINGE
PREFILLED_SYRINGE | INTRAVENOUS | Status: AC
  Filled 2021-08-23: qty 5

## 2021-08-23 MED ORDER — CEFAZOLIN SODIUM-DEXTROSE 2-4 GM/100ML-% IV SOLN
INTRAVENOUS | Status: AC
  Filled 2021-08-23: qty 100

## 2021-08-23 MED ORDER — NALOXONE HCL 4 MG/10ML IJ SOLN
1.0000 ug/kg/h | INTRAVENOUS | Status: DC | PRN
  Filled 2021-08-23: qty 5

## 2021-08-23 MED ORDER — ACETAMINOPHEN 500 MG PO TABS: 1000.0000 mg | ORAL_TABLET | Freq: Four times a day (QID) | ORAL | Status: DC

## 2021-08-23 MED ORDER — MISOPROSTOL 25 MCG QUARTER TABLET
ORAL_TABLET | ORAL | Status: DC | PRN
  Administered 2021-08-23: 800 ug via RECTAL

## 2021-08-23 MED ORDER — HYDROMORPHONE HCL 1 MG/ML IJ SOLN: 0.2000 mg | INTRAMUSCULAR | Status: DC | PRN

## 2021-08-23 MED ORDER — MEDROXYPROGESTERONE ACETATE 150 MG/ML IM SUSP: 150.0000 mg | INTRAMUSCULAR | Status: DC | PRN

## 2021-08-23 MED ORDER — CHLOROPROCAINE HCL (PF) 3 % IJ SOLN
INTRAMUSCULAR | Status: AC
  Filled 2021-08-23: qty 20

## 2021-08-23 MED ORDER — FENTANYL CITRATE (PF) 100 MCG/2ML IJ SOLN
INTRAMUSCULAR | Status: DC | PRN
  Administered 2021-08-23: 100 ug via EPIDURAL

## 2021-08-23 MED ORDER — FENTANYL-BUPIVACAINE-NACL 0.5-0.125-0.9 MG/250ML-% EP SOLN
EPIDURAL | Status: AC
  Filled 2021-08-23: qty 250

## 2021-08-23 MED ORDER — SODIUM BICARBONATE 8.4 % IV SOLN
INTRAVENOUS | Status: AC
  Filled 2021-08-23: qty 50

## 2021-08-23 MED ORDER — COCONUT OIL OIL: 1.0000 "application " | TOPICAL_OIL | Status: DC | PRN

## 2021-08-23 MED ORDER — SIMETHICONE 80 MG PO CHEW
80.0000 mg | CHEWABLE_TABLET | ORAL | Status: DC | PRN
  Administered 2021-08-23 – 2021-08-24 (×2): 80 mg via ORAL
  Filled 2021-08-23: qty 1

## 2021-08-23 MED ORDER — CEFAZOLIN SODIUM-DEXTROSE 2-3 GM-%(50ML) IV SOLR
INTRAVENOUS | Status: DC | PRN
Start: 2021-08-23 — End: 2021-08-23
  Administered 2021-08-23: 2 g via INTRAVENOUS

## 2021-08-23 MED ORDER — SODIUM CHLORIDE 0.9% FLUSH: 3.0000 mL | INTRAVENOUS | Status: DC | PRN

## 2021-08-23 MED ORDER — DIPHENHYDRAMINE HCL 50 MG/ML IJ SOLN: 12.5000 mg | INTRAMUSCULAR | Status: DC | PRN

## 2021-08-23 MED ORDER — LIDOCAINE-EPINEPHRINE (PF) 2 %-1:200000 IJ SOLN
INTRAMUSCULAR | Status: AC
  Filled 2021-08-23: qty 20

## 2021-08-23 MED ORDER — MEASLES, MUMPS & RUBELLA VAC IJ SOLR: 0.5000 mL | Freq: Once | INTRAMUSCULAR | Status: DC

## 2021-08-23 MED ORDER — ONDANSETRON HCL 4 MG/2ML IJ SOLN
INTRAMUSCULAR | Status: DC | PRN
  Administered 2021-08-23: 4 mg via INTRAVENOUS

## 2021-08-23 MED ORDER — SCOPOLAMINE 1 MG/3DAYS TD PT72
MEDICATED_PATCH | TRANSDERMAL | Status: AC
  Filled 2021-08-23: qty 1

## 2021-08-23 MED ORDER — OXYTOCIN-SODIUM CHLORIDE 30-0.9 UT/500ML-% IV SOLN
INTRAVENOUS | Status: DC | PRN
  Administered 2021-08-23 (×2): 150 mL via INTRAVENOUS

## 2021-08-23 MED ORDER — DEXMEDETOMIDINE (PRECEDEX) IN NS 20 MCG/5ML (4 MCG/ML) IV SYRINGE
PREFILLED_SYRINGE | INTRAVENOUS | Status: DC | PRN
  Administered 2021-08-23: 20 ug via INTRAVENOUS

## 2021-08-23 MED ORDER — IBUPROFEN 600 MG PO TABS
600.0000 mg | ORAL_TABLET | Freq: Four times a day (QID) | ORAL | Status: DC
  Administered 2021-08-23 – 2021-08-24 (×4): 600 mg via ORAL
  Filled 2021-08-23 (×4): qty 1

## 2021-08-23 MED ORDER — STERILE WATER FOR IRRIGATION IR SOLN
Status: DC | PRN
  Administered 2021-08-23: 1000 mL

## 2021-08-23 MED ORDER — NALOXONE HCL 0.4 MG/ML IJ SOLN: 0.4000 mg | INTRAMUSCULAR | Status: DC | PRN

## 2021-08-23 MED ORDER — MENTHOL 3 MG MT LOZG: 1.0000 | LOZENGE | OROMUCOSAL | Status: DC | PRN

## 2021-08-23 MED ORDER — DIBUCAINE (PERIANAL) 1 % EX OINT: 1.0000 "application " | TOPICAL_OINTMENT | CUTANEOUS | Status: DC | PRN

## 2021-08-23 MED ORDER — MAGNESIUM SULFATE BOLUS VIA INFUSION
4.0000 g | Freq: Once | INTRAVENOUS | Status: AC
  Administered 2021-08-23: 15:00:00 4 g via INTRAVENOUS
  Filled 2021-08-23: qty 1000

## 2021-08-23 MED ORDER — DIPHENHYDRAMINE HCL 25 MG PO CAPS
25.0000 mg | ORAL_CAPSULE | Freq: Four times a day (QID) | ORAL | Status: DC | PRN
  Filled 2021-08-23: qty 1

## 2021-08-23 SURGICAL SUPPLY — 29 items
APL SKNCLS STERI-STRIP NONHPOA (GAUZE/BANDAGES/DRESSINGS) ×1
BENZOIN TINCTURE PRP APPL 2/3 (GAUZE/BANDAGES/DRESSINGS) ×3 IMPLANT
CHLORAPREP W/TINT 26ML (MISCELLANEOUS) ×3 IMPLANT
CLOSURE STERI STRIP 1/2 X4 (GAUZE/BANDAGES/DRESSINGS) ×1 IMPLANT
CLOSURE WOUND 1/2 X4 (GAUZE/BANDAGES/DRESSINGS) ×1
CLOTH BEACON ORANGE TIMEOUT ST (SAFETY) ×3 IMPLANT
DRSG OPSITE POSTOP 4X10 (GAUZE/BANDAGES/DRESSINGS) ×3 IMPLANT
ELECT REM PT RETURN 9FT ADLT (ELECTROSURGICAL) ×3
ELECTRODE REM PT RTRN 9FT ADLT (ELECTROSURGICAL) ×1 IMPLANT
GLOVE BIOGEL PI IND STRL 7.0 (GLOVE) ×3 IMPLANT
GLOVE BIOGEL PI INDICATOR 7.0 (GLOVE) ×6
GLOVE ECLIPSE 6.5 STRL STRAW (GLOVE) ×3 IMPLANT
GOWN STRL REUS W/ TWL LRG LVL3 (GOWN DISPOSABLE) ×2 IMPLANT
GOWN STRL REUS W/TWL LRG LVL3 (GOWN DISPOSABLE) ×6
HEMOSTAT ARISTA ABSORB 3G PWDR (HEMOSTASIS) ×2 IMPLANT
NS IRRIG 1000ML POUR BTL (IV SOLUTION) ×3 IMPLANT
PAD OB MATERNITY 4.3X12.25 (PERSONAL CARE ITEMS) ×3 IMPLANT
PAD PREP 24X48 CUFFED NSTRL (MISCELLANEOUS) ×3 IMPLANT
RETRACTOR WND ALEXIS 25 LRG (MISCELLANEOUS) IMPLANT
RTRCTR WOUND ALEXIS 25CM LRG (MISCELLANEOUS)
STRIP CLOSURE SKIN 1/2X4 (GAUZE/BANDAGES/DRESSINGS) ×2 IMPLANT
SUT PLAIN 2 0 XLH (SUTURE) ×3 IMPLANT
SUT VIC AB 0 CT1 36 (SUTURE) ×6 IMPLANT
SUT VIC AB 2-0 CT1 27 (SUTURE) ×3
SUT VIC AB 2-0 CT1 TAPERPNT 27 (SUTURE) ×1 IMPLANT
SUT VIC AB 4-0 KS 27 (SUTURE) ×3 IMPLANT
TOWEL OR 17X24 6PK STRL BLUE (TOWEL DISPOSABLE) ×9 IMPLANT
TRAY FOLEY CATH SILVER 16FR (SET/KITS/TRAYS/PACK) ×3 IMPLANT
WATER STERILE IRR 1000ML POUR (IV SOLUTION) ×3 IMPLANT

## 2021-08-23 NOTE — Anesthesia Postprocedure Evaluation (Signed)
Anesthesia Post Note  Patient: Melanie Strong  Procedure(s) Performed: CESAREAN SECTION     Patient location during evaluation: PACU Anesthesia Type: Epidural Level of consciousness: oriented and awake and alert Pain management: pain level controlled Vital Signs Assessment: post-procedure vital signs reviewed and stable Respiratory status: spontaneous breathing, respiratory function stable and patient connected to nasal cannula oxygen Cardiovascular status: blood pressure returned to baseline and stable Postop Assessment: no headache, no backache, no apparent nausea or vomiting and epidural receding Anesthetic complications: no   No notable events documented.  Last Vitals:  Vitals:   08/23/21 1510 08/23/21 1520  BP: (!) 144/69 126/90  Pulse: 81 83  Resp: 17 20  Temp:    SpO2: 90% 94%    Last Pain:  Vitals:   08/23/21 1450  TempSrc: Oral  PainSc:    Pain Goal: Patients Stated Pain Goal: 4 (08/21/21 0829)  LLE Motor Response: Purposeful movement (08/23/21 1500)   RLE Motor Response: Purposeful movement (08/23/21 1500)       Epidural/Spinal Function Cutaneous sensation: Able to Wiggle Toes (08/23/21 1515), Patient able to flex knees: Yes (08/23/21 1515), Patient able to lift hips off bed: Yes (08/23/21 1515), Back pain beyond tenderness at insertion site: No (08/23/21 1515), Progressively worsening motor and/or sensory loss: No (08/23/21 1515), Bowel and/or bladder incontinence post epidural: No (08/23/21 1515)  Kindall Swaby L Elener Custodio

## 2021-08-23 NOTE — Transfer of Care (Signed)
Immediate Anesthesia Transfer of Care Note  Patient: Melanie Strong  Procedure(s) Performed: CESAREAN SECTION  Patient Location: PACU  Anesthesia Type:Spinal  Level of Consciousness: awake, alert  and oriented  Airway & Oxygen Therapy: Patient Spontanous Breathing  Post-op Assessment: Report given to RN and Post -op Vital signs reviewed and stable  Post vital signs: Reviewed and stable  Last Vitals:  Vitals Value Taken Time  BP 102/48 08/23/21 1343  Temp    Pulse 85 08/23/21 1345  Resp 25 08/23/21 1345  SpO2 93 % 08/23/21 1345  Vitals shown include unvalidated device data.  Last Pain:  Vitals:   08/23/21 1000  TempSrc:   PainSc: 0-No pain      Patients Stated Pain Goal: 4 (08/21/21 0829)  Complications: No notable events documented.

## 2021-08-23 NOTE — Progress Notes (Signed)
Patient ID: [name], female   DOB: [birth year], 35 y.o.   MRN: [id number]   Labor Progress Note Melanie Strong is a 35 y.o. G2P0010 at [redacted]w[redacted]d presented for IOL for gHTN and DFM.  S:  Pt resting in bed with FOB at bedside for support and engaged in conversation about Cesarean.  O:  BP (!) 141/69    Pulse 84    Temp 98.2 F (36.8 C) (Oral)    Resp 19    Ht 5\' 6"  (1.676 m)    Wt 192 lb 9.6 oz (87.4 kg)    LMP 11/21/2020    SpO2 99%    BMI 31.09 kg/m  EFM: baseline 130 bpm/ minimal variability/ 10x10 accels/ variable late decels with occasional prolonged  Toco/IUPC: q4-71min SVE: Dilation: 5 Effacement (%): 90 Cervical Position: Posterior Station: -1 Presentation: Vertex Exam by:: [email or web address], CNM Pitocin: decreased to 52mu/min after another prolonged late deceleration  A/P: 35 y.o. G2P0010 [redacted]w[redacted]d  1. Labor: Arrested at 4.5cm 2. FWB: Baby tolerating position changes but with little variability 3. Pain: Well-controlled with epidural, does feel some back labor 4. gHTN: BP continues to be stable but creatinine increased at 2.64, GFR is decreased to <24 and P:Cr up to 2.01. Pt now meets criteria for severe preeclampsia and she has had no cervical change since FB out. MD notified of pt status and new diagnosis of severe PEC, to bedside to consent for Cesarean delivery.  [redacted]w[redacted]d, CNM, MSN, IBCLC Certified Nurse Midwife, [facility]

## 2021-08-23 NOTE — Op Note (Addendum)
[name] PROCEDURE DATE: 08/23/2021  PREOPERATIVE DIAGNOSES: Intrauterine pregnancy at [redacted]w[redacted]d weeks gestation; failure to progress: arrest of dilation, non-reassuring fetal status, and severe preeclampsia  POSTOPERATIVE DIAGNOSES: The same  PROCEDURE: Primary Low Transverse Cesarean Section  SURGEON:   Lyndel Safe, MD Allayne Stack, DO   ANESTHESIOLOGY TEAM: Anesthesiologist: Achille Rich, MD; Cecile Hearing, MD; Elmer Picker, MD CRNA: Cleda Clarks, CRNA  INDICATIONS: Melanie Strong is a 35 y.o. [id number] at [redacted]w[redacted]d here for urgent cesarean section secondary to the indications listed under preoperative diagnoses; please see preoperative note for further details.  The risks of cesarean section were discussed with the patient including but were not limited to: bleeding which may require transfusion or reoperation; infection which may require antibiotics; injury to bowel, bladder, ureters or other surrounding organs; injury to the fetus; need for additional procedures including hysterectomy in the event of a life-threatening hemorrhage; placental abnormalities wth subsequent pregnancies, incisional problems, thromboembolic phenomenon and other postoperative/anesthesia complications.   The patient concurred with the proposed plan, giving informed written consent for the procedure.    FINDINGS:  Viable female infant in cephalic OT presentation with fetal shoulder wedged under chin--head was on maternal right and appeared to be obstructing bladder outflow track.   APGAR (1 MIN): 1   APGAR (5 MINS): 8   Clear amniotic fluid.  Intact placenta, three vessel cord.  Normal uterus, fallopian tubes and ovaries bilaterally.  ANESTHESIA: Epidural  INTRAVENOUS FLUIDS: 1,100 ml   ESTIMATED BLOOD LOSS: 1000 ml URINE OUTPUT:  400 ml SPECIMENS: Placenta sent to pathology COMPLICATIONS: None immediate  PROCEDURE IN DETAIL:  The patient preoperatively received intravenous  antibiotics and had sequential compression devices applied to her lower extremities.  She was then taken to the operating room where the epidural anesthesia was found to be adequate. She was then placed in a dorsal supine position with a leftward tilt, and prepped and draped in a sterile manner.  A foley catheter was placed into her bladder and attached to constant gravity.  After an adequate timeout was performed, a Pfannenstiel skin incision was made with scalpel and carried through to the underlying layer of fascia. The fascia was incised in the midline, and this incision was extended bilaterally using the Mayo scissors.  Kocher clamps were applied to the superior aspect of the fascial incision and the underlying rectus muscles were dissected off bluntly and sharply.  A similar process was carried out on the inferior aspect of the fascial incision. The rectus muscles were separated in the midline and the peritoneum was entered bluntly. The Alexis self-retaining retractor was introduced into the abdominal cavity.  Attention was turned to the lower uterine segment where a low transverse hysterotomy was made with a scalpel and extended bilaterally bluntly. Initially encountered difficulty delivering infant due to fetal positioning. However, the infant was then successfully delivered, the cord was clamped immediately and cut after one minute, and the infant was handed over to the awaiting neonatology team. Cord pH 7.2.   Uterine massage was then administered, and the placenta delivered intact with a three-vessel cord. The uterus was then cleared of clots and debris. Due to poor uterine tone, TXA and rectal Cytotec were given in addition to pitocin. The hysterotomy was closed with 0 Vicryl in a running locked fashion, and an imbricating layer was also placed with 0 Vicryl. Uterine tone improved. Figure-of-eight 0 Vicryl serosal stitches were placed to help with hemostasis.  The pelvis was cleared of  all clot and  debris. Hemostasis was confirmed on all surfaces.  The retractor was removed.  The peritoneum was closed with a 0 Vicryl running stitch and the rectus muscles were reapproximated using 0 Vicryl interrupted stitches. The fascia was then closed using 0 VicrylPDS in a running fashion.  The subcutaneous layer was irrigated, re-approximated with 2-0 plain gut interrupted stitches, and the skin was closed with a 4-0 Vicryl subcuticular stitch. The patient tolerated the procedure well. Sponge, instrument and needle counts were correct x 3.  She was taken to the recovery room in stable condition.   Allayne Stack, DO [facility]   Attestation of Attending Supervision of OB Fellow: Evaluation and management procedures were performed by the Family Medicine OB Fellow under my supervision.  I have reviewed the Fellow's note and chart. I was gloved and gowned for the entirety of the procedure and involved during the case. I have made any necessary editorial changes.   I personally performed delivery of the infant through the hysterotomy due to fetal malposition. The fetal head was essentially the right gutter and OT. The infants right shoulder was the presenting part. In the infant was delivered within minutes of the hysterotomy despite the initial difficulty. I updated the family in the operating room about the fetal position. I was involved the entire case.   [name], MD, MPH, ABFM Attending Physician [facility]

## 2021-08-23 NOTE — Progress Notes (Addendum)
Labor Progress Note Melanie Strong is a 35 y.o. G2P0010 at [redacted]w[redacted]d presented for IOL for decreased FM and elevated BP  S: Feeling low back pain and pressure. Positioned on right side where she is most comfortable  O:  BP 134/63    Pulse 92    Temp 98.7 F (37.1 C) (Oral)    Resp 19    Ht 5\' 6"  (1.676 m)    Wt 87.4 kg    LMP 11/21/2020    SpO2 98%    BMI 31.09 kg/m   FHT: FHR 145 bpm, moderate variability, + accels, no decels  [name]   A&P: 35 y.o. G2P0010 [redacted]w[redacted]d  #Labor: SVE largely unchanged as pitocin was stopped terbutaline x1 administered at 0038 to decreased resting uterine tone, now restarted Pit 2x2 at 32milliunits/minute to achieve adequate MVU contractions. FHR is reassuring and reactive. Will reassess in a few hours or sooner as needed #Pain: Epidural #FWB: Cat 1 #GBS positive> PCN per protocol   3m, DO 2:34 AM

## 2021-08-23 NOTE — Progress Notes (Addendum)
G2P0010 at [redacted]w[redacted]d here from IOL for GHTN with very prolonged labor (started on 12/19). New onset swelling with now grossly elevated creatinine of 2.64. Given GHTN plus creatinine elevation she now meets criteria for severe preeclampsia and has arrest of dilation despite multiple attempts to maximize pitocin therapy. All attempts to optimize pitocin resulted in fetal intolerance.   Indications for C-section: Arrest of dilation and fetal intolerance.   Dilation: 5 Effacement (%): 90 Cervical Position: Posterior Station: -1 Presentation: Vertex Exam by:: Edd Arbour, CNM  The risks of cesarean section were discussed with the patient including but were not limited to: bleeding which may require transfusion or reoperation; infection which may require antibiotics; injury to bowel, bladder, ureters or other surrounding organs; injury to the fetus; need for additional procedures including hysterectomy in the event of a life-threatening hemorrhage; placental abnormalities wth subsequent pregnancies, incisional problems, thromboembolic phenomenon and other postoperative/anesthesia complications.  Patient also desires permanent sterilization.  Other reversible forms of contraception were discussed with patient; she declines all other modalities. The patient concurred with the proposed plan, giving informed written consent for the procedures.  Patient will remain NPO for procedure. Anesthesia and OR aware.  Preoperative prophylactic antibiotics and SCDs ordered on call to the OR.  To OR when ready.   [name], MD, MPH, ABFM, [facility] Attending Physician Center for [facility]

## 2021-08-23 NOTE — Progress Notes (Addendum)
Labor Progress Note Melanie Strong is a 35 y.o. G2P0010 at [redacted]w[redacted]d presented for IOL for decreased FM and elevated BP  S: Feeling more comfortable with epidural. Sitting upright in throne  O:  BP (!) 108/58    Pulse 78    Temp 98.2 F (36.8 C) (Oral)    Resp 19    Ht 5\' 6"  (1.676 m)    Wt 87.4 kg    LMP 11/21/2020    SpO2 98%    BMI 31.09 kg/m   FHT: FHR 150 bpm, moderate variability, + accels, no decels  [name]   A&P: 35 y.o. G2P0010 [redacted]w[redacted]d  #Labor: Progressing well. Continuing to uptitrate pitocin 2x2, [name], DO 7:20 AM

## 2021-08-23 NOTE — Discharge Summary (Addendum)
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 yo G2P1011 at 337w2das admitted to the hospital 08/20/2021 for induction of labor. Patient had a labor course significant for failure to dilate at 4cm with worsening pre-eclampsia with severe features and NRFHT. The patient went for cesarean section due to Arrest of Dilation and Non-Reassuring FHR. Delivery details are as follows: Membrane Rupture Time/Date: 9:56 AM ,08/22/2021   Delivery Method:C-Section, Low Transverse  Details of operation can be found in separate operative Note.  Patient had postoperative ileus and had NG drainage and tolerated feeding with passage of flatus on POD 2. She is ambulating, tolerating a regular diet, passing flatus, and urinating well.  Patient is discharged home in stable condition on 08/25/2021    Newborn Data: Birth date:08/23/2021  Birth time:12:48 PM  Gender:Female  Living status:Living  Apgars:1 ,8  Weight:3940 g                                Magnesium Sulfate received: Yes: Seizure  prophylaxis BMZ received: No Rhophylac:No MMR:No T-DaP:Given prenatally Flu: No Transfusion:No  Physical exam  Vitals:   08/25/21 0413 08/25/21 0752 08/25/21 1257 08/25/21 1604  BP: 130/79 131/84 (!) 133/95 127/71  Pulse: 72 71 69 84  Resp: _0 Temp: 98.3 F (36.8 C) 98.5 F (36.9 C) 98.3 F (36.8 C) 98.1 F (36.7 C)  TempSrc: Oral Oral  Oral  SpO2: 99% 98% 98% 98%  Weight:      Height:       General: alert, cooperative, and no distress Lochia: appropriate Uterine Fundus: firm Incision: Dressing is clean, dry, and intact DVT Evaluation: No evidence of DVT seen on physical exam. Labs: Lab Results  Component Value Date   WBC 12.3 (H) 08/25/2021   HGB 10.2 (L) 08/25/2021   HCT 30.7 (L) 08/25/2021   MCV 85.5 08/25/2021   PLT 199 08/25/2021   CMP Latest Ref Rng & Units 08/25/2021  Glucose 70 - 99 mg/dL 85  BUN 6 - 20 mg/dL 10  Creatinine 0.44 - 1.00 mg/dL 1.09(H)  Sodium 135 - 145 mmol/L 134(L)  Potassium 3.5 - 5.1 mmol/L 3.9  Chloride 98 - 111 mmol/L 100  CO2 22 - 32 mmol/L 24  Calcium 8.9 - 10.3 mg/dL 8.7(L)  Total Protein 6.5 - 8.1 g/dL 5.6(L)  Total Bilirubin 0.3 - 1.2 mg/dL 0.6  Alkaline Phos 38 - 126 U/L 110  AST 15 - 41 U/L 16  ALT 0 - 44 U/L 12   Edinburgh Score: Edinburgh Postnatal Depression Scale Screening Tool 08/23/2021  I have been able to laugh and see the funny side of things. 0  I have looked forward with enjoyment to things. 0  I have blamed myself unnecessarily when things went wrong. 0  I have been anxious or worried for no good reason. 0  I have felt scared or panicky for no good reason. 2  Things have been getting on top of me. 1  I have been so unhappy that I have had difficulty sleeping. 0  I have felt sad or miserable. 2  I have been so unhappy that I have been crying. 1  The thought of harming myself has occurred to me. 0  Edinburgh Postnatal Depression Scale Total 6     After visit meds:  Allergies as of 08/25/2021    No Known Allergies      Medication List     STOP taking these medications    acetaminophen 500 MG tablet Commonly known as: TYLENOL   aspirin EC 81 MG tablet   Blood Pressure Kit Devi   Iron Polysacch Cmplx-B12-FA 150-0.025-1 MG Caps   pantoprazole 40 MG tablet Commonly known as: Protonix   valACYclovir 500 MG tablet Commonly known as: Valtrex       TAKE these medications    furosemide 20 MG tablet Commonly known as: LASIX Take 1 tablet (20 mg total) by mouth 2 (two) times daily.   ibuprofen 600 MG tablet Commonly known as: ADVIL Take 1 tablet (600 mg total) by mouth every 6 (six) hours.   NIFEdipine 30 MG 24 hr tablet Commonly known as: ADALAT CC Take 1 tablet (30 mg total) by mouth daily.   ondansetron 4 MG disintegrating tablet Commonly known as: ZOFRAN-ODT Take 1 tablet (4 mg total) by mouth every 6 (six) hours.   oxyCODONE 5 MG immediate release tablet Commonly known as: Oxy IR/ROXICODONE Take 1-2 tablets (5-10 mg total) by mouth every 4 (four) hours as needed for moderate pain.   prenatal multivitamin Tabs tablet Take 1 tablet by mouth daily at 12 noon.         Discharge home in stable condition Infant Feeding: Bottle Infant Disposition:home with mother Discharge instruction: per After Visit Summary and Postpartum booklet. Activity: Advance as tolerated. Pelvic rest for 6 weeks.  Diet: routine diet Future Appointments:No future appointments. Follow up Visit:  Cecilia Follow up in 1 week(s).   Specialty: Obstetrics and Gynecology Why: For wound re-check Contact information: [address], [place] [phone]                Message sent by Dr Higinio Plan to [facility]:   Please schedule this patient for a In person postpartum visit in 6 weeks with the following provider: Any provider. Additional Postpartum F/U:Incision check 1 week and  BP check 1 week  High risk pregnancy complicated by: HTN Delivery mode:  C-Section, Low Transverse  Anticipated Birth Control:  Unsure   08/25/2021 Emeterio Reeve, MD

## 2021-08-23 NOTE — Progress Notes (Signed)
Patient ID: Melanie Strong, female   DOB: 23-Sep-1985, 35 y.o.   MRN: [id number]   Labor Progress Note Melanie Strong is a 35 y.o. G2P0010 at [redacted]w[redacted]d presented for IOL for gHTN and DFM.  S:  Pt resting in bed with FOB at bedside for support. Tolerating labor well but starting to get concerned about how long things are taking and baby's wellbeing.  O:  BP (!) 141/69    Pulse 84    Temp 98.2 F (36.8 C) (Oral)    Resp 19    Ht 5\' 6"  (1.676 m)    Wt 192 lb 9.6 oz (87.4 kg)    LMP 11/21/2020    SpO2 99%    BMI 31.09 kg/m  EFM: baseline 130 bpm/ minimal variability/ 10x10 accels/ variable late decels with occasional prolonged Toco/IUPC: q4-63min SVE: Dilation: 5 Effacement (%): 90 Cervical Position: Posterior Station: -1 Presentation: Vertex Exam by:: [email or web address], CNM Pitocin: 14 mu/min  A/P: 35 y.o. G2P0010 [redacted]w[redacted]d  1. Labor: Latent to active, cervix has been unchanged since FB out. Pitocin restarted this morning at 0130 after two Pitocin breaks for NRFHR.  2. FWB: Baby now tolerating position changes if mom far on either side.  3. Pain: Well-controlled with epidural, does feel some back labor 4. gHTN: BP stable, no headache or epigastric pain. RN reported possible increased uterine tone, when assessed it appears more like generalized edema. Mom now has mild generalized edema in her face, hands, abdomen and legs. Also of note, UO has dropped. She had appropriate output yesterday but since 5am she has only made >235ml of dark yellow urine. On 149ml/hr of LR and taking in oral liquids. CMP and P:Cr ordered.  Will continue position changes as fetal tolerance allows. Reassured patient that we will be assertive with position changes and work to get baby out. Cautiously anticipate SVD.  32m, CNM, MSN, IBCLC Certified Nurse Midwife, [facility]

## 2021-08-24 ENCOUNTER — Inpatient Hospital Stay (HOSPITAL_COMMUNITY): Payer: BC Managed Care – PPO

## 2021-08-24 ENCOUNTER — Encounter (HOSPITAL_COMMUNITY): Payer: Self-pay | Admitting: Obstetrics and Gynecology

## 2021-08-24 DIAGNOSIS — K567 Ileus, unspecified: Secondary | ICD-10-CM | POA: Insufficient documentation

## 2021-08-24 DIAGNOSIS — N179 Acute kidney failure, unspecified: Secondary | ICD-10-CM | POA: Diagnosis not present

## 2021-08-24 LAB — COMPREHENSIVE METABOLIC PANEL
ALT: 11 U/L (ref 0–44)
AST: 22 U/L (ref 15–41)
Albumin: 2.3 g/dL — ABNORMAL LOW (ref 3.5–5.0)
Alkaline Phosphatase: 133 U/L — ABNORMAL HIGH (ref 38–126)
Anion gap: 8 (ref 5–15)
BUN: 6 mg/dL (ref 6–20)
CO2: 24 mmol/L (ref 22–32)
Calcium: 9.4 mg/dL (ref 8.9–10.3)
Chloride: 103 mmol/L (ref 98–111)
Creatinine, Ser: 1.14 mg/dL — ABNORMAL HIGH (ref 0.44–1.00)
GFR, Estimated: >60 mL/min (ref >60)
Glucose, Bld: 84 mg/dL (ref 70–99)
Potassium: 4.4 mmol/L (ref 3.5–5.1)
Sodium: 135 mmol/L (ref 135–145)
Total Bilirubin: 1.1 mg/dL (ref 0.3–1.2)
Total Protein: 5.9 g/dL — ABNORMAL LOW (ref 6.5–8.1)

## 2021-08-24 LAB — CBC
HCT: 32.7 % — ABNORMAL LOW (ref 36.0–46.0)
Hemoglobin: 10.6 g/dL — ABNORMAL LOW (ref 12.0–15.0)
MCH: 28.4 pg (ref 26.0–34.0)
MCHC: 32.4 g/dL (ref 30.0–36.0)
MCV: 87.7 fL (ref 80.0–100.0)
Platelets: 183 10*3/uL (ref 150–400)
RBC: 3.73 MIL/uL — ABNORMAL LOW (ref 3.87–5.11)
RDW: 15.8 % — ABNORMAL HIGH (ref 11.5–15.5)
WBC: 14.5 10*3/uL — ABNORMAL HIGH (ref 4.0–10.5)
nRBC: 0 % (ref 0.0–0.2)

## 2021-08-24 MED ORDER — PANTOPRAZOLE SODIUM 40 MG IV SOLR
40.0000 mg | INTRAVENOUS | Status: DC
  Administered 2021-08-24: 16:00:00 40 mg via INTRAVENOUS
  Filled 2021-08-24: qty 40

## 2021-08-24 MED ORDER — PHENOL 1.4 % MT LIQD
1.0000 | OROMUCOSAL | Status: DC | PRN
  Administered 2021-08-24: 20:00:00 1 via OROMUCOSAL
  Filled 2021-08-24: qty 177

## 2021-08-24 MED ORDER — DEXTROSE IN LACTATED RINGERS 5 % IV SOLN: INTRAVENOUS | Status: DC

## 2021-08-24 MED ORDER — METOCLOPRAMIDE HCL 5 MG/ML IJ SOLN: 5.0000 mg | Freq: Four times a day (QID) | INTRAMUSCULAR | Status: DC

## 2021-08-24 MED ORDER — FUROSEMIDE 10 MG/ML IJ SOLN
10.0000 mg | Freq: Two times a day (BID) | INTRAMUSCULAR | Status: DC
  Administered 2021-08-24 – 2021-08-25 (×2): 10 mg via INTRAVENOUS
  Filled 2021-08-24 (×2): qty 2

## 2021-08-24 MED ORDER — KETOROLAC TROMETHAMINE 15 MG/ML IJ SOLN
15.0000 mg | Freq: Four times a day (QID) | INTRAMUSCULAR | Status: DC | PRN
  Administered 2021-08-24 – 2021-08-25 (×2): 15 mg via INTRAVENOUS
  Filled 2021-08-24 (×3): qty 1

## 2021-08-24 MED ORDER — FUROSEMIDE 40 MG PO TABS
20.0000 mg | ORAL_TABLET | Freq: Two times a day (BID) | ORAL | Status: DC
  Administered 2021-08-24: 09:00:00 20 mg via ORAL
  Filled 2021-08-24: qty 1

## 2021-08-24 MED ORDER — DIAZEPAM 5 MG PO TABS: 5.0000 mg | ORAL_TABLET | Freq: Once | ORAL | Status: DC

## 2021-08-24 MED ORDER — SIMETHICONE 80 MG PO CHEW
80.0000 mg | CHEWABLE_TABLET | Freq: Three times a day (TID) | ORAL | Status: DC
  Administered 2021-08-24: 13:00:00 80 mg via ORAL
  Filled 2021-08-24: qty 1

## 2021-08-24 MED ORDER — DIAZEPAM 5 MG/ML IJ SOLN
5.0000 mg | Freq: Once | INTRAMUSCULAR | Status: AC
  Administered 2021-08-24: 19:00:00 5 mg via INTRAVENOUS
  Filled 2021-08-24: qty 2

## 2021-08-24 MED ORDER — CHEWING GUM (ORBIT) SUGAR FREE
1.0000 | CHEWING_GUM | Freq: Four times a day (QID) | ORAL | Status: DC
  Administered 2021-08-24 – 2021-08-25 (×3): 1 via ORAL
  Filled 2021-08-24: qty 1

## 2021-08-24 MED ORDER — METOCLOPRAMIDE HCL 5 MG/ML IJ SOLN: 5.0000 mg | Freq: Four times a day (QID) | INTRAMUSCULAR | Status: DC | PRN

## 2021-08-24 MED ORDER — ONDANSETRON HCL 4 MG/2ML IJ SOLN
4.0000 mg | Freq: Four times a day (QID) | INTRAMUSCULAR | Status: DC
  Administered 2021-08-24 – 2021-08-25 (×2): 4 mg via INTRAVENOUS
  Filled 2021-08-24 (×2): qty 2

## 2021-08-24 MED ORDER — IBUPROFEN 600 MG PO TABS: 600.0000 mg | ORAL_TABLET | Freq: Four times a day (QID) | ORAL | Status: DC | PRN

## 2021-08-24 NOTE — Progress Notes (Signed)
Pt appears to have increased abdominal distension since earlier this morning. Bowel signs hypoactive and pt states she has been burping, but not passing gas. Pt denies N/V, but abdomen is hard and tender when palpated. Notified Dr. Vergie Living of concerns. Received verbal order for abdominal x-ray and for pt to be kept NPO. Will continue to monitor.

## 2021-08-24 NOTE — Progress Notes (Signed)
Subjective: Postpartum Day 1: Cesarean Delivery Patient reports doing well with some incisional soreness. She denies feeling dizzy or lightheaded.    Objective: Vital signs in last 24 hours: Temp:  [97.3 F (36.3 C)-98.1 F (36.7 C)] 97.8 F (36.6 C) (12/23 0423) Pulse Rate:  [71-91] 83 (12/23 0423) Resp:  [13-26] 17 (12/23 0423) BP: (102-144)/(48-91) 133/85 (12/23 0423) SpO2:  [90 %-99 %] 98 % (12/23 0423)  Physical Exam:  General: alert, cooperative, and no distress Lochia: appropriate Uterine Fundus: firm Incision: dressing is minimally stained DVT Evaluation: No evidence of DVT seen on physical exam.  Recent Labs    08/23/21 1415 08/24/21 0426  HGB 10.0* 10.6*  HCT 31.7* 32.7*    Assessment/Plan: Status post Cesarean section.  Continue magnesium sulfate for seizure prophylaxis until 24 hours postpartum BP currently stable without medications Creatinine also improving now 1.14. will repeat labs in the morning Continue close monitoring Doing well postoperatively.  Continue current care.  Coran Dipaola 08/24/2021, 7:48 AM

## 2021-08-24 NOTE — Progress Notes (Signed)
Wasted 1 mL of valium in stericycle with Rickard Rhymes, RN.

## 2021-08-24 NOTE — Progress Notes (Signed)
Subjective: Postpartum Day 1: Cesarean Delivery Patient reports nausea, vomiting, and no problems voiding. Her emesis has resolved with the NGT  She denies pain at this time, but the NGT is bothersome to her.   Objective: Vital signs in last 24 hours: Temp:  [97.6 F (36.4 C)-98.3 F (36.8 C)] 98.2 F (36.8 C) (12/23 2026) Pulse Rate:  [70-89] 70 (12/23 2026) Resp:  [17-18] 17 (12/23 2026) BP: (128-142)/(69-89) 135/86 (12/23 2026) SpO2:  [96 %-100 %] 100 % (12/23 2026)   Intake/Output Summary (Last 24 hours) at 08/24/2021 2153 Last data filed at 08/24/2021 2000 Gross per 24 hour  Intake 3250.56 ml  Output 4925 ml  Net -1674.44 ml  -- NGT put out over 1L since placement    Physical Exam:  General: alert, cooperative, and no distress Incision: healing well, c/d/I Abd: distended, no BS, nontender DVT Evaluation: No evidence of DVT seen on physical exam. No cords or calf tenderness.  Recent Labs    08/23/21 1415 08/24/21 0426  HGB 10.0* 10.6*  HCT 31.7* 32.7*    Assessment/Plan: Status post Cesarean section.   Postoperative course complicated by: - Severe PreE - s/p Mag, Bps normotensive to mild range with no medications - AKI - Creatinine was 2.6 yesterday but improved to 1.14. For recheck in the AM - Ileus - Initially supportive measures done with IVF, NPO but then she had significant vomiting in s/o distention and confirmed ileus on KUB. NGT placed and with initially significant drainage. Will continue bowel rest. Discuss gum. Discussed ambulation. Discussed while pain control is our goal, opioids will also potentially worsen the ileus.   Continue current care.  Milas Hock 08/24/2021, 9:52 PM

## 2021-08-25 LAB — CBC
HCT: 30.7 % — ABNORMAL LOW (ref 36.0–46.0)
Hemoglobin: 10.2 g/dL — ABNORMAL LOW (ref 12.0–15.0)
MCH: 28.4 pg (ref 26.0–34.0)
MCHC: 33.2 g/dL (ref 30.0–36.0)
MCV: 85.5 fL (ref 80.0–100.0)
Platelets: 199 10*3/uL (ref 150–400)
RBC: 3.59 MIL/uL — ABNORMAL LOW (ref 3.87–5.11)
RDW: 15.4 % (ref 11.5–15.5)
WBC: 12.3 10*3/uL — ABNORMAL HIGH (ref 4.0–10.5)
nRBC: 0 % (ref 0.0–0.2)

## 2021-08-25 LAB — COMPREHENSIVE METABOLIC PANEL
ALT: 12 U/L (ref 0–44)
AST: 16 U/L (ref 15–41)
Albumin: 2.1 g/dL — ABNORMAL LOW (ref 3.5–5.0)
Alkaline Phosphatase: 110 U/L (ref 38–126)
Anion gap: 10 (ref 5–15)
BUN: 10 mg/dL (ref 6–20)
CO2: 24 mmol/L (ref 22–32)
Calcium: 8.7 mg/dL — ABNORMAL LOW (ref 8.9–10.3)
Chloride: 100 mmol/L (ref 98–111)
Creatinine, Ser: 1.09 mg/dL — ABNORMAL HIGH (ref 0.44–1.00)
GFR, Estimated: >60 mL/min (ref >60)
Glucose, Bld: 85 mg/dL (ref 70–99)
Potassium: 3.9 mmol/L (ref 3.5–5.1)
Sodium: 134 mmol/L — ABNORMAL LOW (ref 135–145)
Total Bilirubin: 0.6 mg/dL (ref 0.3–1.2)
Total Protein: 5.6 g/dL — ABNORMAL LOW (ref 6.5–8.1)

## 2021-08-25 MED ORDER — ACETAMINOPHEN 500 MG PO TABS
1000.0000 mg | ORAL_TABLET | Freq: Four times a day (QID) | ORAL | Status: DC
  Administered 2021-08-25: 16:00:00 1000 mg via ORAL
  Filled 2021-08-25: qty 2

## 2021-08-25 MED ORDER — OXYCODONE HCL 5 MG PO TABS: 5.0000 mg | ORAL_TABLET | ORAL | 0 refills | Status: DC | PRN

## 2021-08-25 MED ORDER — FUROSEMIDE 20 MG PO TABS
20.0000 mg | ORAL_TABLET | Freq: Two times a day (BID) | ORAL | 0 refills | Status: DC
Start: 2021-08-25 — End: 2022-06-19

## 2021-08-25 MED ORDER — IBUPROFEN 600 MG PO TABS: 600.0000 mg | ORAL_TABLET | Freq: Four times a day (QID) | ORAL | 0 refills | Status: DC

## 2021-08-25 MED ORDER — IBUPROFEN 600 MG PO TABS
600.0000 mg | ORAL_TABLET | Freq: Four times a day (QID) | ORAL | Status: DC
  Administered 2021-08-25: 13:00:00 600 mg via ORAL
  Filled 2021-08-25: qty 1

## 2021-08-25 MED ORDER — SIMETHICONE 80 MG PO CHEW
80.0000 mg | CHEWABLE_TABLET | Freq: Three times a day (TID) | ORAL | Status: DC
  Administered 2021-08-25: 16:00:00 80 mg via ORAL
  Filled 2021-08-25: qty 1

## 2021-08-25 MED ORDER — ACETAMINOPHEN 10 MG/ML IV SOLN
1000.0000 mg | Freq: Four times a day (QID) | INTRAVENOUS | Status: DC
  Administered 2021-08-25: 09:00:00 1000 mg via INTRAVENOUS
  Filled 2021-08-25 (×2): qty 100

## 2021-08-25 MED ORDER — ONDANSETRON 4 MG PO TBDP
4.0000 mg | ORAL_TABLET | Freq: Four times a day (QID) | ORAL | Status: DC
  Administered 2021-08-25: 13:00:00 4 mg via ORAL
  Filled 2021-08-25: qty 1

## 2021-08-25 MED ORDER — FUROSEMIDE 20 MG PO TABS
10.0000 mg | ORAL_TABLET | Freq: Two times a day (BID) | ORAL | Status: DC
  Filled 2021-08-25 (×2): qty 0.5

## 2021-08-25 MED ORDER — NIFEDIPINE ER OSMOTIC RELEASE 30 MG PO TB24
30.0000 mg | ORAL_TABLET | Freq: Every day | ORAL | Status: DC
  Administered 2021-08-25: 16:00:00 30 mg via ORAL
  Filled 2021-08-25: qty 1

## 2021-08-25 MED ORDER — NIFEDIPINE ER 30 MG PO TB24: 30.0000 mg | ORAL_TABLET | Freq: Every day | ORAL | 1 refills | Status: DC

## 2021-08-25 MED ORDER — ONDANSETRON 4 MG PO TBDP: 4.0000 mg | ORAL_TABLET | Freq: Four times a day (QID) | ORAL | 0 refills | Status: DC

## 2021-08-25 NOTE — Plan of Care (Signed)
°  Problem: Education: Goal: Knowledge of Childbirth will improve Outcome: Adequate for Discharge Goal: Ability to make informed decisions regarding treatment and plan of care will improve Outcome: Adequate for Discharge Goal: Ability to state and carry out methods to decrease the pain will improve Outcome: Adequate for Discharge Goal: Individualized Educational Video(s) Outcome: Adequate for Discharge   Problem: Coping: Goal: Ability to verbalize concerns and feelings about labor and delivery will improve Outcome: Adequate for Discharge   Problem: Life Cycle: Goal: Ability to make normal progression through stages of labor will improve Outcome: Adequate for Discharge Goal: Ability to effectively push during vaginal delivery will improve Outcome: Adequate for Discharge   Problem: Role Relationship: Goal: Will demonstrate positive interactions with the child Outcome: Adequate for Discharge   Problem: Safety: Goal: Risk of complications during labor and delivery will decrease Outcome: Adequate for Discharge   Problem: Pain Management: Goal: Relief or control of pain from uterine contractions will improve Outcome: Adequate for Discharge   Problem: Education: Goal: Knowledge of General Education information will improve Description: Including pain rating scale, medication(s)/side effects and non-pharmacologic comfort measures Outcome: Adequate for Discharge   Problem: Health Behavior/Discharge Planning: Goal: Ability to manage health-related needs will improve Outcome: Adequate for Discharge   Problem: Clinical Measurements: Goal: Ability to maintain clinical measurements within normal limits will improve 08/25/2021 1645 by Samuella Cota, RN Outcome: Adequate for Discharge 08/25/2021 1146 by Samuella Cota, RN Outcome: Progressing Goal: Will remain free from infection 08/25/2021 1645 by Samuella Cota, RN Outcome: Adequate for Discharge 08/25/2021 1146 by Samuella Cota,  RN Outcome: Progressing Goal: Diagnostic test results will improve 08/25/2021 1645 by Samuella Cota, RN Outcome: Adequate for Discharge 08/25/2021 1146 by Samuella Cota, RN Outcome: Progressing Goal: Respiratory complications will improve 08/25/2021 1645 by Samuella Cota, RN Outcome: Adequate for Discharge 08/25/2021 1146 by Samuella Cota, RN Outcome: Progressing Goal: Cardiovascular complication will be avoided 08/25/2021 1645 by Samuella Cota, RN Outcome: Adequate for Discharge 08/25/2021 1146 by Samuella Cota, RN Outcome: Progressing   Problem: Activity: Goal: Risk for activity intolerance will decrease 08/25/2021 1645 by Samuella Cota, RN Outcome: Adequate for Discharge 08/25/2021 1146 by Samuella Cota, RN Outcome: Progressing   Problem: Nutrition: Goal: Adequate nutrition will be maintained 08/25/2021 1645 by Samuella Cota, RN Outcome: Adequate for Discharge 08/25/2021 1146 by Samuella Cota, RN Outcome: Progressing   Problem: Coping: Goal: Level of anxiety will decrease 08/25/2021 1645 by Samuella Cota, RN Outcome: Adequate for Discharge 08/25/2021 1146 by Samuella Cota, RN Outcome: Progressing   Problem: Elimination: Goal: Will not experience complications related to bowel motility 08/25/2021 1645 by Samuella Cota, RN Outcome: Adequate for Discharge 08/25/2021 1146 by Samuella Cota, RN Outcome: Progressing   Problem: Pain Managment: Goal: General experience of comfort will improve Outcome: Adequate for Discharge   Problem: Safety: Goal: Ability to remain free from injury will improve Outcome: Adequate for Discharge   Problem: Skin Integrity: Goal: Risk for impaired skin integrity will decrease Outcome: Adequate for Discharge

## 2021-08-25 NOTE — Progress Notes (Signed)
Patient is ambulating independently and passing flatus. Patient states desire to go home. Dr. Debroah Loop made aware. Plan to advance diet and monitor patient response.

## 2021-08-25 NOTE — Progress Notes (Signed)
Pt. made this RN aware that she no longer wanted NG tube in place. Dr. Para March was notified and made aware. This RN and Dr. Para March educated pt. and pt.stated understanding of teach. NG was removed by other RN.

## 2021-08-25 NOTE — Plan of Care (Signed)
°  Problem: Clinical Measurements: Goal: Ability to maintain clinical measurements within normal limits will improve Outcome: Progressing Goal: Will remain free from infection Outcome: Progressing Goal: Diagnostic test results will improve Outcome: Progressing Goal: Respiratory complications will improve Outcome: Progressing Goal: Cardiovascular complication will be avoided Outcome: Progressing   Problem: Activity: Goal: Risk for activity intolerance will decrease Outcome: Progressing   Problem: Nutrition: Goal: Adequate nutrition will be maintained Outcome: Progressing   Problem: Coping: Goal: Level of anxiety will decrease Outcome: Progressing   Problem: Elimination: Goal: Will not experience complications related to bowel motility Outcome: Progressing  Patient passing flatus and walking. Diet advanced. RN to monitor

## 2021-08-25 NOTE — Progress Notes (Signed)
Subjective: Postpartum Day 2: Cesarean Delivery Patient reports pain this morning but no nausea. She denies any emesis. She wanted the NG out today despite counseling. Denies flatus. No BM.    Objective: Vital signs in last 24 hours: Temp:  [97.6 F (36.4 C)-98.3 F (36.8 C)] 98.3 F (36.8 C) (12/24 0413) Pulse Rate:  [70-81] 72 (12/24 0413) Resp:  [17-18] 18 (12/24 0413) BP: (130-142)/(76-89) 130/79 (12/24 0413) SpO2:  [96 %-100 %] 99 % (12/24 0413)   Intake/Output Summary (Last 24 hours) at 08/25/2021 0728 Last data filed at 08/25/2021 8588 Gross per 24 hour  Intake 3146.7 ml  Output 2870 ml  Net 276.7 ml   -- Minimal new outpt from NG from when I last visited her overnight.    Physical Exam:  General: alert, cooperative, and no distress CV: RRR Pulm: CTAB Incision: healing well, c/d/I Abd: distended, no BS, mildly tender on exam DVT Evaluation: No evidence of DVT seen on physical exam. No cords or calf tenderness.  Recent Labs    08/24/21 0426 08/25/21 0654  HGB 10.6* 10.2*  HCT 32.7* 30.7*     Assessment/Plan: Status post Cesarean section.   Postoperative course complicated by: - Severe PreE - s/p Mag, Bps normotensive to mild range with no medications - AKI - Creatinine was 2.6 yesterday but improved to 1.14. For recheck this AM but pending.  - Ileus - Initially supportive measures done with IVF, NPO but then she had significant vomiting in s/o distention and confirmed ileus on KUB. NGT placed and with initially significant drainage but she wanted it removed today. Will continue bowel rest. Discuss gum. Discussed ambulation. Discussed while pain control is our goal, opioids will also potentially worsen the ileus.   She wanted disposition to home today but we did discuss extensively that this would not be safe due to her lack of bowel function. She would not be able to keep anything down and  would then be at high risk for returning to the hospital.    Continue current care.  Milas Hock 08/25/2021, 7:28 AM

## 2021-08-28 LAB — SURGICAL PATHOLOGY

## 2021-08-30 ENCOUNTER — Other Ambulatory Visit: Payer: Self-pay

## 2021-08-30 ENCOUNTER — Encounter: Payer: Self-pay | Admitting: Obstetrics and Gynecology

## 2021-08-30 ENCOUNTER — Ambulatory Visit (INDEPENDENT_AMBULATORY_CARE_PROVIDER_SITE_OTHER): Payer: BC Managed Care – PPO | Admitting: Obstetrics and Gynecology

## 2021-08-30 VITALS — BP 133/86 | HR 73 | Wt 185.0 lb

## 2021-08-30 DIAGNOSIS — O165 Unspecified maternal hypertension, complicating the puerperium: Secondary | ICD-10-CM

## 2021-08-30 DIAGNOSIS — Z4889 Encounter for other specified surgical aftercare: Secondary | ICD-10-CM | POA: Insufficient documentation

## 2021-08-30 MED ORDER — HYDROCHLOROTHIAZIDE 25 MG PO TABS: 25.0000 mg | ORAL_TABLET | Freq: Every day | ORAL | 3 refills | Status: DC

## 2021-08-30 NOTE — Progress Notes (Signed)
°  CC: wound check, BP check Subjective:    Patient ID: Melanie Strong, female    DOB: 02-27-1986, 35 y.o.   MRN: [id number]  HPI Pt seen for wound check s/p primary c section on 08/23/21.  Pt had ileus with NGT placed after delivery.  It appears pt was discharged home on 08/25/21.  At home pt has not had nausea or vomiting.  Decreased appetite noted.  She notes daily flatus several times a day.  Bowel movements are minimal and are like light diarrhea.   Review of Systems     Objective:   Physical Exam Vitals:   08/30/21 1405 08/30/21 1417  BP: (!) 147/93 133/86  Pulse: 79 73  Abdomen: moderately distended, mild tympany, hypoactive bowel sounds. Honeycomb bandage removed, wound clean dry and intact. 1-2+ bilateral lower extremity edema.       Assessment & Plan:   1. Postpartum hypertension Medication change due to headaches and peripheral edema which may come from procardia Repeat BP check in 1 week - hydrochlorothiazide (HYDRODIURIL) 25 MG tablet; Take 1 tablet (25 mg total) by mouth daily.  Dispense: 30 tablet; Refill: 3  2. Encounter for post surgical wound check Wound it intact.  Still some concern for residual ileus. Do not think she needs immediate re-admission due to lack of nausea and continued flatus. Advised increased ambulation, simethicone and coffee to hopefully stimulate improved bowel function. Pt strongly advised if there is increased abdominal pain, recurrence of nausea and emesis, or cannot tolerate fluids, she should immediately be seen at [facility] for re-evaluation. Reassess bowel function in 1 week.    Warden Fillers, MD Faculty Attending, Center for [facility]

## 2021-08-30 NOTE — Progress Notes (Addendum)
1 week PP presents for BP check and incision check.  C/o headache 8/10, floaters, swollen ankles with pitting and swollen hands, bloating in abdomin and pain on her sides 10/10.  Denies SOB, blurry vision.  Lasix is finished and did not help with the swelling.

## 2021-09-04 ENCOUNTER — Telehealth (HOSPITAL_COMMUNITY): Payer: Self-pay | Admitting: *Deleted

## 2021-09-04 NOTE — Telephone Encounter (Signed)
Attempted Hospital Discharge Follow-Up Call.  Left voice mail requesting that patient return RN's phone call.  

## 2021-09-05 ENCOUNTER — Ambulatory Visit (INDEPENDENT_AMBULATORY_CARE_PROVIDER_SITE_OTHER): Payer: BC Managed Care – PPO | Admitting: Obstetrics and Gynecology

## 2021-09-05 ENCOUNTER — Other Ambulatory Visit: Payer: Self-pay

## 2021-09-05 VITALS — BP 133/69 | HR 82 | Ht 65.0 in | Wt 169.1 lb

## 2021-09-05 DIAGNOSIS — K9189 Other postprocedural complications and disorders of digestive system: Secondary | ICD-10-CM

## 2021-09-05 DIAGNOSIS — N179 Acute kidney failure, unspecified: Secondary | ICD-10-CM

## 2021-09-05 DIAGNOSIS — O1413 Severe pre-eclampsia, third trimester: Secondary | ICD-10-CM

## 2021-09-05 DIAGNOSIS — K567 Ileus, unspecified: Secondary | ICD-10-CM

## 2021-09-05 MED ORDER — POLYETHYLENE GLYCOL 3350 17 G PO PACK: 17.0000 g | PACK | Freq: Two times a day (BID) | ORAL | 1 refills | Status: DC

## 2021-09-05 MED ORDER — FAMOTIDINE 20 MG PO TABS: 20.0000 mg | ORAL_TABLET | Freq: Two times a day (BID) | ORAL | 3 refills | Status: DC | PRN

## 2021-09-05 NOTE — Progress Notes (Signed)
Postpartum Follow Up Visit  Appointment Date: 09/05/2021  Primary Care Provider: Patient, No Pcp Per (Inactive)  OBGYN Clinic: [facility]  Chief Complaint: regularly scheduled follow up  History of Present Illness:  Melanie Strong is a 36 y.o. s/p 12/22 pLTCS at 39wks due to failed IOL with NRFHTs and severe pre-eclampsia with PP course c/b AKI ileus  Patient seen on 12/29 for a check up and started on hctz  Interval History: Since that time, she states that had a BM the day before yesterday and denies any GI s/s. She is wondering if she can increase what she eats  Review of Systems: as noted in the History of Present Illness.  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  Physical Exam:  BP 133/69    Pulse 82    Ht 5\' 5"  (1.651 m)    Wt 169 lb 1.6 oz (76.7 kg)    LMP 11/21/2020 Comment: decreasing lochia   Breastfeeding No    BMI 28.14 kg/m  Body mass index is 28.14 kg/m. General appearance: Well nourished, well developed female in no acute distress.  Abdomen: diffusely non tender to palpation, non distended, and no masses, hernias. C/d/I incision Neuro/Psych:  Normal mood and affect.    Assessment: pt doing well  Plan:  1. AKI (acute kidney injury) (HCC) - Basic metabolic panel  2. Ileus, postoperative (HCC) Pt not on BM regimen. Miralax bid sent in and pt told to decrease to qday once BMs are regular. Okay to start gradually increasing diet back to normal Pt was told to  have a lite diet so unsure of any gerd s/s. Pepcid prn sent in  3. Severe pre-eclampsia in third trimester Continue hctz   RTC: 2-3wks for regular PP visit  11/23/2020 MD Attending Center for [facility])

## 2021-09-05 NOTE — Progress Notes (Signed)
Postpartum BP check and abdomen/ illeus check. Had BM 2 days ago.

## 2021-09-06 LAB — BASIC METABOLIC PANEL
BUN/Creatinine Ratio: 9 (ref 9–23)
BUN: 7 mg/dL (ref 6–20)
CO2: 22 mmol/L (ref 20–29)
Calcium: 9.5 mg/dL (ref 8.7–10.2)
Chloride: 105 mmol/L (ref 96–106)
Creatinine, Ser: 0.79 mg/dL (ref 0.57–1.00)
Glucose: 96 mg/dL (ref 70–99)
Potassium: 4.3 mmol/L (ref 3.5–5.2)
Sodium: 146 mmol/L — ABNORMAL HIGH (ref 134–144)
eGFR: 101 mL/min/{1.73_m2} (ref >59)

## 2021-09-10 ENCOUNTER — Encounter: Payer: Self-pay | Admitting: *Deleted

## 2021-09-13 ENCOUNTER — Telehealth: Payer: Self-pay

## 2021-09-13 NOTE — Telephone Encounter (Signed)
Patient called stating that she is not able to eat solid foods due to having abdominal pain from an ileus. Patient states that she is able to tolerate broth, but anything more than that causes her severe abdominal pain. She states that she is still taking the miralax daily, but wants to know what else she can do to help the pain and advance her diet to solids. She states that she is very hungry and would like to be able to tolerate more that just liquids.

## 2021-09-13 NOTE — Telephone Encounter (Signed)
Patient informed. 

## 2021-09-14 ENCOUNTER — Encounter (HOSPITAL_COMMUNITY): Payer: Self-pay | Admitting: Obstetrics & Gynecology

## 2021-09-14 ENCOUNTER — Inpatient Hospital Stay (HOSPITAL_COMMUNITY): Payer: BC Managed Care – PPO

## 2021-09-14 ENCOUNTER — Other Ambulatory Visit: Payer: Self-pay

## 2021-09-14 ENCOUNTER — Inpatient Hospital Stay (HOSPITAL_COMMUNITY)
Admission: AD | Admit: 2021-09-14 | Discharge: 2021-09-14 | Disposition: A | Discharge status: Home / Self Care | Payer: BC Managed Care – PPO | Attending: Obstetrics & Gynecology | Admitting: Obstetrics & Gynecology

## 2021-09-14 DIAGNOSIS — R1013 Epigastric pain: Secondary | ICD-10-CM | POA: Insufficient documentation

## 2021-09-14 DIAGNOSIS — O9089 Other complications of the puerperium, not elsewhere classified: Secondary | ICD-10-CM | POA: Insufficient documentation

## 2021-09-14 LAB — COMPREHENSIVE METABOLIC PANEL
ALT: 17 U/L (ref 0–44)
AST: 21 U/L (ref 15–41)
Albumin: 4 g/dL (ref 3.5–5.0)
Alkaline Phosphatase: 96 U/L (ref 38–126)
Anion gap: 10 (ref 5–15)
BUN: 7 mg/dL (ref 6–20)
CO2: 24 mmol/L (ref 22–32)
Calcium: 9.8 mg/dL (ref 8.9–10.3)
Chloride: 105 mmol/L (ref 98–111)
Creatinine, Ser: 0.76 mg/dL (ref 0.44–1.00)
GFR, Estimated: >60 mL/min (ref >60)
Glucose, Bld: 93 mg/dL (ref 70–99)
Potassium: 4 mmol/L (ref 3.5–5.1)
Sodium: 139 mmol/L (ref 135–145)
Total Bilirubin: 1.3 mg/dL — ABNORMAL HIGH (ref 0.3–1.2)
Total Protein: 7.8 g/dL (ref 6.5–8.1)

## 2021-09-14 LAB — CBC WITH DIFFERENTIAL/PLATELET
Abs Immature Granulocytes: 0.01 10*3/uL (ref 0.00–0.07)
Basophils Absolute: 0 10*3/uL (ref 0.0–0.1)
Basophils Relative: 1 %
Eosinophils Absolute: 0.1 10*3/uL (ref 0.0–0.5)
Eosinophils Relative: 1 %
HCT: 40.1 % (ref 36.0–46.0)
Hemoglobin: 13.2 g/dL (ref 12.0–15.0)
Immature Granulocytes: 0 %
Lymphocytes Relative: 52 %
Lymphs Abs: 2.2 10*3/uL (ref 0.7–4.0)
MCH: 28.5 pg (ref 26.0–34.0)
MCHC: 32.9 g/dL (ref 30.0–36.0)
MCV: 86.6 fL (ref 80.0–100.0)
Monocytes Absolute: 0.5 10*3/uL (ref 0.1–1.0)
Monocytes Relative: 11 %
Neutro Abs: 1.5 10*3/uL — ABNORMAL LOW (ref 1.7–7.7)
Neutrophils Relative %: 35 %
Platelets: 303 10*3/uL (ref 150–400)
RBC: 4.63 MIL/uL (ref 3.87–5.11)
RDW: 14.7 % (ref 11.5–15.5)
WBC: 4.2 10*3/uL (ref 4.0–10.5)
nRBC: 0 % (ref 0.0–0.2)

## 2021-09-14 LAB — LIPASE, BLOOD: Lipase: 48 U/L (ref 11–51)

## 2021-09-14 NOTE — MAU Provider Note (Signed)
History     CSN: [id number]  Arrival date and time: 09/14/21 1406  None    Chief Complaint  Patient presents with   Abdominal Pain   HPI Melanie Strong is a 36 y.o. G2P1011 who is s/p pLTCS on 08/23/2021 for arrest of dilation/NRFHT's who presents to MAU for abdominal pain. Postpartum course has been complicated by ileus. Patient reports she was seen by OB on 1/4 and was started on Miralax and told that she could slowly start advancing her diet to more solid foods. Patient reports since then, every time she eats she gets severe upper abdominal pain. Pain usually starts about 1-1.5 hours after eating and then eventually resolves on it's own. She describes the pain "worse than labor". Nothing makes the pain better. Has tried eating chicken noodle soup, crackers, and chili, all of which produce the pain. She is feeling discouraged because she "just wants to eat real food". She is not currently having pain. She reports that she is having regular BM's, last was this morning. She denies nausea or vomiting, no fever, chills. She was prescribed Pepcid on the day of her visit, however was unaware of this so she has not taken it.    OB History     Gravida  2   Para  1   Term  1   Preterm      AB  1   Living  1      SAB  1   IAB      Ectopic      Multiple  0   Live Births  1           Past Medical History:  Diagnosis Date   Anemia    Glucose found in urine on examination    Infection    UTI    Past Surgical History:  Procedure Laterality Date   CESAREAN SECTION  08/23/2021   Procedure: CESAREAN SECTION;  Surgeon: [name], MD;  Location: [facility] LD [facility]S;  Service: Obstetrics;;   NO PAST SURGERIES      Family History  Problem Relation Age of Onset   Hypertension Mother    Diabetes Mother        Boarderline   Lupus Mother    Cancer Paternal Aunt        brain   Cancer Father        rectal   Sickle cell anemia Sister    Healthy Brother    Healthy  Brother    Other Neg Hx     Social History   Tobacco Use   Smoking status: Every Day    Packs/day: 0.50    Years: 10.00    Pack years: 5.00    Types: Cigarettes   Smokeless tobacco: Never  Vaping Use   Vaping Use: Never used  Substance Use Topics   Alcohol use: No    Comment: rare   Drug use: No    Allergies: No Known Allergies  Medications Prior to Admission  Medication Sig Dispense Refill Last Dose   hydrochlorothiazide (HYDRODIURIL) 25 MG tablet Take 1 tablet (25 mg total) by mouth daily. 30 tablet 3 09/14/2021   ibuprofen (ADVIL) 600 MG tablet Take 1 tablet (600 mg total) by mouth every 6 (six) hours. 30 tablet 0 09/14/2021   polyethylene glycol (MIRALAX / GLYCOLAX) 17 g packet Take 17 g by mouth 2 (two) times daily. Decrease to daily once you are having a daily, easy bowel movement 30 each 1  09/14/2021   Prenatal Vit-Fe Fumarate-FA (PRENATAL MULTIVITAMIN) TABS tablet Take 1 tablet by mouth daily at 12 noon.   09/14/2021   famotidine (PEPCID) 20 MG tablet Take 1 tablet (20 mg total) by mouth 2 (two) times daily as needed for heartburn or indigestion. 60 tablet 3    furosemide (LASIX) 20 MG tablet Take 1 tablet (20 mg total) by mouth 2 (two) times daily. 5 tablet 0    ondansetron (ZOFRAN-ODT) 4 MG disintegrating tablet Take 1 tablet (4 mg total) by mouth every 6 (six) hours. (Patient not taking: Reported on 08/30/2021) 20 tablet 0    oxyCODONE (OXY IR/ROXICODONE) 5 MG immediate release tablet Take 1-2 tablets (5-10 mg total) by mouth every 4 (four) hours as needed for moderate pain. 30 tablet 0     Review of Systems  Constitutional: Negative.   Respiratory: Negative.    Cardiovascular: Negative.   Gastrointestinal:  Positive for abdominal pain. Negative for constipation, diarrhea, nausea and vomiting.  Genitourinary: Negative.   Musculoskeletal: Negative.   Neurological: Negative.    Physical Exam   Patient Vitals for the past 24 hrs:  BP Temp Temp src Pulse Resp SpO2  Height Weight  09/14/21 1425 128/81 98.2 F (36.8 C) Oral 93 19 99 % -- --  09/14/21 1420 -- -- -- -- -- -- 5\' 5"  (1.651 m) 73.1 kg   Physical Exam Vitals and nursing note reviewed.  Constitutional:      General: She is not in acute distress.    Appearance: She is not ill-appearing.  Eyes:     Extraocular Movements: Extraocular movements intact.     Pupils: Pupils are equal, round, and reactive to light.  Cardiovascular:     Rate and Rhythm: Normal rate.  Pulmonary:     Effort: Pulmonary effort is normal.  Abdominal:     General: There is no distension.     Palpations: Abdomen is soft.     Tenderness: There is abdominal tenderness in the epigastric area. There is no guarding or rebound.     Comments: Incision well-approximated, healing appropriately, clean, dry  Musculoskeletal:        General: Normal range of motion.     Cervical back: Normal range of motion.  Skin:    General: Skin is warm and dry.  Neurological:     General: No focal deficit present.     Mental Status: She is alert and oriented to person, place, and time.  Psychiatric:        Mood and Affect: Mood normal.        Behavior: Behavior normal.        Thought Content: Thought content normal.        Judgment: Judgment normal.   US ABDOMEN LIMITED RUQ (LIVER/GB)  Result Date: 09/14/2021 CLINICAL DATA:  Epigastric pain EXAM: ULTRASOUND ABDOMEN LIMITED RIGHT UPPER QUADRANT COMPARISON:  None. FINDINGS: Gallbladder: Gallbladder is well distended. No cholelithiasis or gallbladder wall thickening is noted. Mild gallbladder sludge is seen. Common bile duct: Diameter: 3.7 mm. Liver: Multiple hyperechoic lesions are identified throughout the liver the largest of which measures 2.0 cm in the right lobe of the liver. These likely represent multiple hemangiomas. Portal vein is patent on color Doppler imaging with normal direction of blood flow towards the liver. Other: None. IMPRESSION: Gallbladder sludge. Multiple hyperechoic  lesions throughout the liver as described likely representing small hemangiomas. Follow-up examination in 6 months is recommended to assess for stability. Electronically Signed   By: Loraine LericheMark  Lukens M.D.   On: 09/14/2021 19:33    MAU Course  Procedures CBC, CMP, Lipase RUQ [place]  MDM Labs unremarkable. US shows gallbladder sludge, no cholelithiasis or cholecystitis. Symptoms likely c/w gallbladder sludge or stomach stretching as patient has not had any solid food for 3 weeks. I have a low suspicion for ileus as patient's symptoms are not c/w. I discussed with patient that a small (2cm) liver hemangioma was noted on today's ultrasound and would likely need follow up with PCP in 6 months.   Assessment and Plan  Postpartum Epigastric pain  - Discharge home in stable condition - Patient encouraged to pick up Pepcid rx and take BID as prescribed. Reviewed low fat gallbladder diet as well as eating small, frequent meals. - Strict return precautions reviewed. Return to MAU as needed for worsening symptoms - Keep appointment as scheduled on 09/26/21    Brand Males, CNM 09/14/2021, 8:12 PM

## 2021-09-14 NOTE — MAU Note (Signed)
Presents stating s/p Cesarean on 08/23/2022 and had a ileus during PP course.  Reports has been on liquid diet since discharged from hospital.  Instructed she may begin eating solids this past Monday, but after attempting to eat  has abdominal pain.  Reports abdominal pain occurs with each attempt to eat solids.

## 2021-09-26 ENCOUNTER — Other Ambulatory Visit: Payer: Self-pay

## 2021-09-26 ENCOUNTER — Ambulatory Visit (INDEPENDENT_AMBULATORY_CARE_PROVIDER_SITE_OTHER): Payer: BC Managed Care – PPO | Admitting: Obstetrics

## 2021-09-26 ENCOUNTER — Encounter: Payer: Self-pay | Admitting: Obstetrics

## 2021-09-26 DIAGNOSIS — F53 Postpartum depression: Secondary | ICD-10-CM

## 2021-09-26 MED ORDER — TRINATE PO TABS: 1.0000 | ORAL_TABLET | Freq: Every day | ORAL | 11 refills | Status: DC

## 2021-09-26 MED ORDER — ESCITALOPRAM OXALATE 10 MG PO TABS: 10.0000 mg | ORAL_TABLET | Freq: Every day | ORAL | 5 refills | Status: DC

## 2021-09-26 NOTE — Progress Notes (Signed)
Post Partum Visit Note  Melanie Strong is a 36 y.o. G43P1011 female who presents for a postpartum visit. She is 4 weeks postpartum following a primary cesarean section.  I have fully reviewed the prenatal and intrapartum course. The delivery was at 39.2 gestational weeks.  Anesthesia: epidural. Postpartum course has been complicated by ileus. Baby is doing well. Baby is feeding by bottle - Similac 360 . Bleeding no bleeding. Bowel function is  constipated . Bladder function is normal. Patient is not sexually active. Contraception method is none. Postpartum depression screening: Positive = 10 Patient refuse to speak with Sue Lush.   The pregnancy intention screening data noted above was reviewed. Potential methods of contraception were discussed. The patient elected to proceed with No data recorded.   Edinburgh Postnatal Depression Scale - 09/26/21 1514       Edinburgh Postnatal Depression Scale:  In the Past 7 Days   I have been able to laugh and see the funny side of things. 3    I have looked forward with enjoyment to things. 1    I have blamed myself unnecessarily when things went wrong. 0    I have been anxious or worried for no good reason. 1    I have felt scared or panicky for no good reason. 0    Things have been getting on top of me. 0    I have been so unhappy that I have had difficulty sleeping. 0    I have felt sad or miserable. 3    I have been so unhappy that I have been crying. 3    The thought of harming myself has occurred to me. 0    Edinburgh Postnatal Depression Scale Total 11             Health Maintenance Due  Topic Date Due   COVID-19 Vaccine (1) Never done   TETANUS/TDAP  Never done   INFLUENZA VACCINE  Never done    The following portions of the patient's history were reviewed and updated as appropriate: allergies, current medications, past family history, past medical history, past social history, past surgical history, and problem list.  Review of  Systems A comprehensive review of systems was negative.  Objective:  BP 127/82    Pulse 97    Ht 5\' 5"  (1.651 m)    Wt 158 lb (71.7 kg)    Breastfeeding No    BMI 26.29 kg/m    General:  alert and no distress   Breasts:  normal  Lungs: clear to auscultation bilaterally  Heart:  regular rate and rhythm, S1, S2 normal, no murmur, click, rub or gallop  Abdomen: soft, non-tender; bowel sounds normal; no masses,  no organomegaly   Wound well approximated incision  GU exam:  not indicated       Assessment:   1. Postpartum care following cesarean delivery Rx: - Prenatal Vit-Fe Fumarate-FA (TRINATE) TABS; Take 1 tablet by mouth daily before breakfast.  Dispense: 30 tablet; Refill: 11  2. Postpartum depression Rx: - escitalopram (LEXAPRO) 10 MG tablet; Take 1 tablet (10 mg total) by mouth daily.  Dispense: 30 tablet; Refill: 5   Plan:   Essential components of care per ACOG recommendations:  1.  Mood and well being: Patient with positive depression screening today. Reviewed local resources for support.  Patient declines recommended counseling. - Patient tobacco use? No.   - hx of drug use? No.    2. Infant care and feeding:  -  Patient currently breastmilk feeding? No.  -Social determinants of health (SDOH) reviewed in EPIC. No concerns.  3. Sexuality, contraception and birth spacing - Patient does not want a pregnancy in the next year.  Desired family size is undecided. - Reviewed forms of contraception in tiered fashion. Patient desired no method today.   - Discussed birth spacing of 18 months  4. Sleep and fatigue -Encouraged family/partner/community support of 4 hrs of uninterrupted sleep to help with mood and fatigue  5. Physical Recovery  - Discussed patients delivery and complications. She describes her labor as bad. - Patient had a C-section emergent.  - Patient has urinary incontinence? No. - Patient is not safe to resume physical and sexual activity  6.  Health  Maintenance - HM due items addressed Yes - Last pap smear  Diagnosis  Date Value Ref Range Status  02/01/2021   Final   - Negative for intraepithelial lesion or malignancy (NILM)   Pap smear not done at today's visit.  -Breast Cancer screening indicated? No.   7. Chronic Disease/Pregnancy Condition follow up: None  - PCP follow up prn  Charles A. Clearance Coots MD Center for [facility], [facility], [facility] 09/26/21

## 2021-10-31 ENCOUNTER — Ambulatory Visit (INDEPENDENT_AMBULATORY_CARE_PROVIDER_SITE_OTHER): Payer: BC Managed Care – PPO | Admitting: Obstetrics

## 2021-10-31 ENCOUNTER — Encounter: Payer: Self-pay | Admitting: Obstetrics

## 2021-10-31 ENCOUNTER — Other Ambulatory Visit: Payer: Self-pay

## 2021-10-31 DIAGNOSIS — J301 Allergic rhinitis due to pollen: Secondary | ICD-10-CM

## 2021-10-31 DIAGNOSIS — Z3009 Encounter for other general counseling and advice on contraception: Secondary | ICD-10-CM

## 2021-10-31 MED ORDER — LORATADINE 10 MG PO TABS: 10.0000 mg | ORAL_TABLET | Freq: Every day | ORAL | 11 refills | Status: DC

## 2021-10-31 NOTE — Progress Notes (Signed)
Subjective:  ?  ? Melanie Strong is a 36 y.o. female who presents for a postpartum visit. She is 9 weeks postpartum following a low cervical transverse Cesarean section. I have fully reviewed the prenatal and intrapartum course. The delivery was at 39.2 gestational weeks. Outcome: primary cesarean section, low transverse incision. Anesthesia: epidural. Postpartum course has been normal. Baby's course has been normal. Baby is feeding by bottle - Similac . Bleeding no bleeding. Bowel function is normal. Bladder function is normal. Patient is sexually active. Contraception method is none. Postpartum depression screening: negative.  Tobacco, alcohol and substance abuse history reviewed.  Adult immunizations reviewed including TDAP, rubella and varicella. ? ?The following portions of the patient's history were reviewed and updated as appropriate: allergies, current medications, past family history, past medical history, past social history, past surgical history, and problem list. ? ?Review of Systems ?A comprehensive review of systems was negative.  ? ?Objective:  ? ? BP 123/76   Pulse 74   Wt 156 lb (70.8 kg)   Breastfeeding Yes   BMI 25.96 kg/m?   ?General:  alert and no distress  ? Breasts:  inspection negative, no nipple discharge or bleeding, no masses or nodularity palpable  ?Lungs: clear to auscultation bilaterally  ?Heart:  regular rate and rhythm, S1, S2 normal, no murmur, click, rub or gallop  ?Abdomen: soft, non-tender; bowel sounds normal; no masses,  no organomegaly and incision is clean, dry and intact  ?The remainder of the physical exam deferred due to the type of encounter. ?    ? ? I have spent a total of 20 minutes of face-to-face time, excluding clinical staff time, reviewing notes and preparing to see patient, ordering tests and/or medications, and counseling the patient.  ? ?Assessment:  ? ? 1. Postpartum care following cesarean delivery ?- doing well ? ?2. Encounter for other general  counseling and advice on contraception ?- declines contraception ?- condoms recommended for STI prevention ? ?3. Seasonal allergic rhinitis due to pollen ?Rx: ?- loratadine (CLARITIN) 10 MG tablet; Take 1 tablet (10 mg total) by mouth daily.  Dispense: 30 tablet; Refill: 11  ? ? ?Plan:  ? ? 1. Contraception: none ?2. Continue PNV's ?3. Follow up in: 3 months for Annual / Pap ? ? ?Shelly Bombard, MD ?10/31/2021 4:34 PM  ?

## 2021-11-16 ENCOUNTER — Encounter: Payer: Self-pay | Admitting: *Deleted

## 2021-11-19 ENCOUNTER — Encounter: Payer: Self-pay | Admitting: Obstetrics

## 2021-11-26 ENCOUNTER — Encounter: Payer: Self-pay | Admitting: Obstetrics

## 2021-11-26 ENCOUNTER — Telehealth: Payer: Self-pay

## 2021-11-26 NOTE — Telephone Encounter (Signed)
S/w patient and advised that after speaking to her STD department, she needs to call and file an extension to her appeal to allow time for [place] to send requested paperwork, pt agreed. ?

## 2021-12-21 ENCOUNTER — Encounter: Payer: Self-pay | Admitting: Obstetrics

## 2022-01-02 ENCOUNTER — Telehealth: Payer: Self-pay | Admitting: *Deleted

## 2022-01-02 NOTE — Telephone Encounter (Signed)
Dr Clearance Coots has attempted telephone call x 2 to Dr Noel Gerold with [employer] disability regarding pt claim.  ?VM has been left for Dr Noel Gerold to return call. ? ?

## 2022-01-31 DIAGNOSIS — Z419 Encounter for procedure for purposes other than remedying health state, unspecified: Secondary | ICD-10-CM | POA: Diagnosis not present

## 2022-02-01 ENCOUNTER — Other Ambulatory Visit: Payer: Self-pay

## 2022-02-01 ENCOUNTER — Encounter (HOSPITAL_COMMUNITY): Payer: Self-pay

## 2022-02-01 ENCOUNTER — Emergency Department (HOSPITAL_COMMUNITY): Payer: Medicaid Other

## 2022-02-01 ENCOUNTER — Emergency Department (HOSPITAL_COMMUNITY)
Admission: EM | Admit: 2022-02-01 | Discharge: 2022-02-01 | Discharge status: Against Medical Advice | Payer: Medicaid Other | Attending: Emergency Medicine | Admitting: Emergency Medicine

## 2022-02-01 DIAGNOSIS — Z5321 Procedure and treatment not carried out due to patient leaving prior to being seen by health care provider: Secondary | ICD-10-CM | POA: Diagnosis not present

## 2022-02-01 DIAGNOSIS — R110 Nausea: Secondary | ICD-10-CM | POA: Diagnosis not present

## 2022-02-01 DIAGNOSIS — R109 Unspecified abdominal pain: Secondary | ICD-10-CM | POA: Insufficient documentation

## 2022-02-01 DIAGNOSIS — O26891 Other specified pregnancy related conditions, first trimester: Secondary | ICD-10-CM | POA: Insufficient documentation

## 2022-02-01 LAB — CBC WITH DIFFERENTIAL/PLATELET
Abs Immature Granulocytes: 0.02 10*3/uL (ref 0.00–0.07)
Basophils Absolute: 0 10*3/uL (ref 0.0–0.1)
Basophils Relative: 1 %
Eosinophils Absolute: 0.1 10*3/uL (ref 0.0–0.5)
Eosinophils Relative: 1 %
HCT: 35.8 % — ABNORMAL LOW (ref 36.0–46.0)
Hemoglobin: 11.7 g/dL — ABNORMAL LOW (ref 12.0–15.0)
Immature Granulocytes: 0 %
Lymphocytes Relative: 36 %
Lymphs Abs: 2.3 10*3/uL (ref 0.7–4.0)
MCH: 27.1 pg (ref 26.0–34.0)
MCHC: 32.7 g/dL (ref 30.0–36.0)
MCV: 82.9 fL (ref 80.0–100.0)
Monocytes Absolute: 0.4 10*3/uL (ref 0.1–1.0)
Monocytes Relative: 6 %
Neutro Abs: 3.5 10*3/uL (ref 1.7–7.7)
Neutrophils Relative %: 56 %
Platelets: 251 10*3/uL (ref 150–400)
RBC: 4.32 MIL/uL (ref 3.87–5.11)
RDW: 13.6 % (ref 11.5–15.5)
WBC: 6.3 10*3/uL (ref 4.0–10.5)
nRBC: 0 % (ref 0.0–0.2)

## 2022-02-01 LAB — HCG, QUANTITATIVE, PREGNANCY: hCG, Beta Chain, Quant, S: 62 m[IU]/mL — ABNORMAL HIGH (ref <5)

## 2022-02-01 LAB — COMPREHENSIVE METABOLIC PANEL
ALT: 14 U/L (ref 0–44)
AST: 17 U/L (ref 15–41)
Albumin: 4.2 g/dL (ref 3.5–5.0)
Alkaline Phosphatase: 54 U/L (ref 38–126)
Anion gap: 8 (ref 5–15)
BUN: 10 mg/dL (ref 6–20)
CO2: 22 mmol/L (ref 22–32)
Calcium: 9.8 mg/dL (ref 8.9–10.3)
Chloride: 107 mmol/L (ref 98–111)
Creatinine, Ser: 0.73 mg/dL (ref 0.44–1.00)
GFR, Estimated: >60 mL/min (ref >60)
Glucose, Bld: 117 mg/dL — ABNORMAL HIGH (ref 70–99)
Potassium: 3.6 mmol/L (ref 3.5–5.1)
Sodium: 137 mmol/L (ref 135–145)
Total Bilirubin: 0.7 mg/dL (ref 0.3–1.2)
Total Protein: 7.6 g/dL (ref 6.5–8.1)

## 2022-02-01 LAB — POC URINE PREG, ED: Preg Test, Ur: POSITIVE — AB

## 2022-02-01 LAB — URINALYSIS, ROUTINE W REFLEX MICROSCOPIC
Bilirubin Urine: NEGATIVE
Glucose, UA: NEGATIVE mg/dL
Hgb urine dipstick: NEGATIVE
Ketones, ur: NEGATIVE mg/dL
Leukocytes,Ua: NEGATIVE
Nitrite: NEGATIVE
Protein, ur: NEGATIVE mg/dL
Specific Gravity, Urine: 1.024 (ref 1.005–1.030)
pH: 5 (ref 5.0–8.0)

## 2022-02-01 LAB — LIPASE, BLOOD: Lipase: 35 U/L (ref 11–51)

## 2022-02-01 NOTE — ED Triage Notes (Signed)
Reports abd cramping x 3 days.  +nausea

## 2022-02-01 NOTE — ED Provider Triage Note (Signed)
Emergency Medicine Provider Triage Evaluation Note  Melanie Strong , a 36 y.o. female  was evaluated in triage.  Pt complains of abd pain. Intermittent lower abd cramping x 3 days with nausea.  No fever, chills, dysuria, cp, sob, hematuria  Review of Systems  Positive: As above Negative: As above  Physical Exam  BP 112/90   Pulse 92   Temp 99.7 F (37.6 C) (Oral)   Resp 16   Ht 5\' 5"  (1.651 m)   Wt 70.8 kg   LMP 01/03/2022   SpO2 100%   BMI 25.96 kg/m  Gen:   Awake, no distress   Resp:  Normal effort  MSK:   Moves extremities without difficulty  Other:    Medical Decision Making  Medically screening exam initiated at 5:13 PM.  Appropriate orders placed.  03/05/2022 was informed that the remainder of the evaluation will be completed by another provider, this initial triage assessment does not replace that evaluation, and the importance of remaining in the ED until their evaluation is complete.     [name], PA-C 02/01/22 1715

## 2022-02-13 ENCOUNTER — Telehealth: Payer: Self-pay

## 2022-03-02 DIAGNOSIS — Z419 Encounter for procedure for purposes other than remedying health state, unspecified: Secondary | ICD-10-CM | POA: Diagnosis not present

## 2022-03-04 ENCOUNTER — Ambulatory Visit (INDEPENDENT_AMBULATORY_CARE_PROVIDER_SITE_OTHER): Payer: BC Managed Care – PPO

## 2022-03-04 ENCOUNTER — Ambulatory Visit (INDEPENDENT_AMBULATORY_CARE_PROVIDER_SITE_OTHER): Payer: Medicaid Other

## 2022-03-04 ENCOUNTER — Encounter: Payer: Self-pay | Admitting: Obstetrics and Gynecology

## 2022-03-04 VITALS — BP 119/76 | HR 91 | Ht 65.0 in | Wt 152.5 lb

## 2022-03-04 DIAGNOSIS — Z3480 Encounter for supervision of other normal pregnancy, unspecified trimester: Secondary | ICD-10-CM

## 2022-03-04 DIAGNOSIS — O3680X0 Pregnancy with inconclusive fetal viability, not applicable or unspecified: Secondary | ICD-10-CM

## 2022-03-04 NOTE — Progress Notes (Signed)
Patient was assessed and managed by nursing staff during this encounter. I have reviewed the chart and agree with the documentation and plan. I have also made any necessary editorial changes.  Warden Fillers, MD 03/04/2022 9:13 AM

## 2022-03-04 NOTE — Progress Notes (Cosign Needed)
New OB Intake  I connected with  Melanie Strong on 03/04/22 at  8:15 AM EDT by in person and verified that I am speaking with the correct person using two identifiers. Nurse is located at [facility] and pt is located at [place].  I discussed the limitations, risks, security and privacy concerns of performing an evaluation and management service by telephone and the availability of in person appointments. I also discussed with the patient that there may be a patient responsible charge related to this service. The patient expressed understanding and agreed to proceed.  I explained I am completing New OB Intake today. We discussed her EDD of 10/10/21 that is based on LMP of 01/03/22. Pt is G3/P1011. I reviewed her allergies, medications, Medical/Surgical/OB history, and appropriate screenings. I informed her of [facility] services. Based on history, this is a/an  pregnancy uncomplicated .   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   MyChart/Babyscripts MyChart access verified. I explained pt will have some visits in office and some virtually. Babyscripts instructions given and order placed. Patient verifies receipt of registration text/e-mail. Account successfully created and app downloaded.  Blood Pressure Cuff  Patient has BP cuff from most recent pregnancy. Explained after first prenatal appt pt will check weekly and document in Babyscripts.  Weight scale: Patient does have weight scale from  most recent pregnancy.  Anatomy US Explained first scheduled [place] will be around 19 weeks. Dating and viability scan performed today.  Labs Discussed Avelina Laine genetic screening with patient. Would like both Panorama and Horizon drawn at new OB visit.Also if interested in genetic testing, tell patient she will need AFP 15-21 weeks to complete genetic testing .Routine prenatal labs needed.  Covid Vaccine Patient has covid vaccine.   Is patient a CenteringPregnancy candidate?  Declined Declined due to Group Setting Not a candidate due to  Centering Patient" indicated on sticky note   Is patient interested in [place]?  No  "Interested in [employer] - Schedule next visit with CNM" on sticky note  Informed patient of [facility] website  and placed link in her AVS.   Social Determinants of Health Food Insecurity: Patient denies food insecurity. WIC Referral: Patient is interested in referral to [facility].  Transportation: Patient denies transportation needs. Childcare: Discussed no children allowed at ultrasound appointments. Offered childcare services; patient declines childcare services at this time.  Send link to Pregnancy Navigators   Placed OB Box on problem list and updated  First visit review I reviewed new OB appt with pt. I explained she will have a pelvic exam, ob bloodwork with genetic screening, and PAP smear. Explained pt will be seen by Scheryl Darter at first visit; encounter routed to appropriate provider. Explained that patient will be seen by pregnancy navigator following visit with provider. [facility] information placed in AVS.   Hamilton Capri, RN 03/04/2022  8:14 AM

## 2022-03-07 ENCOUNTER — Encounter: Payer: Self-pay | Admitting: Obstetrics and Gynecology

## 2022-03-20 ENCOUNTER — Ambulatory Visit (INDEPENDENT_AMBULATORY_CARE_PROVIDER_SITE_OTHER): Payer: Medicaid Other | Admitting: Obstetrics & Gynecology

## 2022-03-20 ENCOUNTER — Other Ambulatory Visit (HOSPITAL_COMMUNITY)
Admission: RE | Admit: 2022-03-20 | Discharge: 2022-03-20 | Disposition: A | Discharge status: Home / Self Care | Payer: Medicaid Other | Source: Ambulatory Visit | Attending: Obstetrics & Gynecology | Admitting: Obstetrics & Gynecology

## 2022-03-20 VITALS — BP 116/74 | Wt 153.0 lb

## 2022-03-20 DIAGNOSIS — Z98891 History of uterine scar from previous surgery: Secondary | ICD-10-CM

## 2022-03-20 DIAGNOSIS — Z3481 Encounter for supervision of other normal pregnancy, first trimester: Secondary | ICD-10-CM

## 2022-03-20 DIAGNOSIS — Z3480 Encounter for supervision of other normal pregnancy, unspecified trimester: Secondary | ICD-10-CM | POA: Diagnosis not present

## 2022-03-20 DIAGNOSIS — Z3A10 10 weeks gestation of pregnancy: Secondary | ICD-10-CM

## 2022-03-20 DIAGNOSIS — O09299 Supervision of pregnancy with other poor reproductive or obstetric history, unspecified trimester: Secondary | ICD-10-CM

## 2022-03-20 HISTORY — DX: Supervision of pregnancy with other poor reproductive or obstetric history, unspecified trimester: O09.299

## 2022-03-20 MED ORDER — ASPIRIN 81 MG PO TBEC: 81.0000 mg | DELAYED_RELEASE_TABLET | Freq: Every day | ORAL | 12 refills | Status: DC

## 2022-03-20 NOTE — Progress Notes (Signed)
  Subjective:NOB    Melanie Strong is a G3P1011 [redacted]w[redacted]d being seen today for her first obstetrical visit.  Her obstetrical history is significant for advanced maternal age. Patient does intend to breast feed. Pregnancy history fully reviewed.  Patient reports no complaints.  Vitals:   03/20/22 0926  BP: 116/74  Weight: 153 lb (69.4 kg)    HISTORY: OB History  Gravida Para Term Preterm AB Living  3 1 1   1 1   SAB IAB Ectopic Multiple Live Births  1     0 1    # Outcome Date GA Lbr Len/2nd Weight Sex Delivery Anes PTL Lv  3 Current           2 Term 08/23/21 [redacted]w[redacted]d  8 lb 11 oz (3.94 kg) F CS-LTranv EPI  LIV  1 SAB 06/29/20 [redacted]w[redacted]d    SAB      Past Medical History:  Diagnosis Date   Anemia    Glucose found in urine on examination    Infection    UTI   Past Surgical History:  Procedure Laterality Date   CESAREAN SECTION  08/23/2021   Procedure: CESAREAN SECTION;  Surgeon: 08/25/2021, MD;  Location: [facility];  Service: Obstetrics;;   NO PAST SURGERIES     Family History  Problem Relation Age of Onset   Hypertension Mother    Diabetes Mother        Boarderline   Lupus Mother    Cancer Father        rectal   Sickle cell anemia Sister    Healthy Brother    Healthy Brother    Diabetes Maternal Grandmother    Cancer Paternal Aunt        brain   Other Neg Hx      Exam    Uterus:   11 weeks  Pelvic Exam:    Perineum: No Hemorrhoids   Vulva: normal   Vagina:     pH:    Cervix: no lesions   Adnexa: not evaluated   Bony Pelvis:   System: Breast:     Skin: normal coloration and turgor, no rashes    Neurologic: oriented   Extremities: normal strength, tone, and muscle mass   HEENT PERRLA   Mouth/Teeth mucous membranes moist, pharynx normal without lesions   Neck supple   Cardiovascular: regular rate and rhythm   Respiratory:  appears well, vitals normal, no respiratory distress, acyanotic, normal RR   Abdomen: soft, non-tender; bowel sounds normal;  no masses,  no organomegaly   Urinary:       Assessment:    Pregnancy: [name] Patient Active Problem List   Diagnosis Date Noted   Hx of preeclampsia, prior pregnancy, currently pregnant 03/20/2022   Supervision of other normal pregnancy, antepartum 03/04/2022   Genital herpes 07/11/2021   Alpha thalassemia silent carrier 02/13/2021   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 Prenatal vitamins. Problem list reviewed and updated. Genetic Screening discussed : ordered.  Ultrasound discussed; fetal survey: ordered.  Follow up in 4 weeks. 50% of 30 min visit spent on counseling and coordination of care.  Candidate for VBAC ASA due to h/o preeclampsia   04/04/2021 03/20/2022

## 2022-03-21 ENCOUNTER — Encounter: Payer: Self-pay | Admitting: Obstetrics & Gynecology

## 2022-03-21 LAB — CBC/D/PLT+RPR+RH+ABO+RUBIGG...
Antibody Screen: NEGATIVE
Basophils Absolute: 0 10*3/uL (ref 0.0–0.2)
Basos: 1 %
EOS (ABSOLUTE): 0.1 10*3/uL (ref 0.0–0.4)
Eos: 1 %
HCV Ab: NONREACTIVE
HIV Screen 4th Generation wRfx: NONREACTIVE
Hematocrit: 32.7 % — ABNORMAL LOW (ref 34.0–46.6)
Hemoglobin: 11.1 g/dL (ref 11.1–15.9)
Hepatitis B Surface Ag: NEGATIVE
Immature Grans (Abs): 0 10*3/uL (ref 0.0–0.1)
Immature Granulocytes: 0 %
Lymphocytes Absolute: 1.6 10*3/uL (ref 0.7–3.1)
Lymphs: 29 %
MCH: 26.6 pg (ref 26.6–33.0)
MCHC: 33.9 g/dL (ref 31.5–35.7)
MCV: 78 fL — ABNORMAL LOW (ref 79–97)
Monocytes Absolute: 0.5 10*3/uL (ref 0.1–0.9)
Monocytes: 8 %
Neutrophils Absolute: 3.4 10*3/uL (ref 1.4–7.0)
Neutrophils: 61 %
Platelets: 264 10*3/uL (ref 150–450)
RBC: 4.18 x10E6/uL (ref 3.77–5.28)
RDW: 13.1 % (ref 11.7–15.4)
RPR Ser Ql: NONREACTIVE
Rh Factor: POSITIVE
Rubella Antibodies, IGG: 1.02 index (ref >0.99)
WBC: 5.5 10*3/uL (ref 3.4–10.8)

## 2022-03-21 LAB — HCV INTERPRETATION

## 2022-03-21 LAB — CERVICOVAGINAL ANCILLARY ONLY
Bacterial Vaginitis (gardnerella): NEGATIVE
Candida Glabrata: NEGATIVE
Candida Vaginitis: NEGATIVE
Chlamydia: NEGATIVE
Comment: NEGATIVE
Comment: NEGATIVE
Comment: NEGATIVE
Comment: NEGATIVE
Comment: NEGATIVE
Comment: NORMAL
Neisseria Gonorrhea: NEGATIVE
Trichomonas: NEGATIVE

## 2022-03-21 LAB — COMPREHENSIVE METABOLIC PANEL
ALT: 9 IU/L (ref 0–32)
AST: 9 IU/L (ref 0–40)
Albumin/Globulin Ratio: 1.7 (ref 1.2–2.2)
Albumin: 4.5 g/dL (ref 3.9–4.9)
Alkaline Phosphatase: 67 IU/L (ref 44–121)
BUN/Creatinine Ratio: 21 (ref 9–23)
BUN: 9 mg/dL (ref 6–20)
Bilirubin Total: 0.3 mg/dL (ref 0.0–1.2)
CO2: 20 mmol/L (ref 20–29)
Calcium: 10.1 mg/dL (ref 8.7–10.2)
Chloride: 101 mmol/L (ref 96–106)
Creatinine, Ser: 0.43 mg/dL — ABNORMAL LOW (ref 0.57–1.00)
Globulin, Total: 2.6 g/dL (ref 1.5–4.5)
Glucose: 89 mg/dL (ref 70–99)
Potassium: 4.4 mmol/L (ref 3.5–5.2)
Sodium: 137 mmol/L (ref 134–144)
Total Protein: 7.1 g/dL (ref 6.0–8.5)
eGFR: 130 mL/min/{1.73_m2} (ref >59)

## 2022-03-21 LAB — HEMOGLOBIN A1C
Est. average glucose Bld gHb Est-mCnc: 111 mg/dL
Hgb A1c MFr Bld: 5.5 % (ref 4.8–5.6)

## 2022-03-21 LAB — PROTEIN / CREATININE RATIO, URINE
Creatinine, Urine: 133.5 mg/dL
Protein, Ur: 11.9 mg/dL
Protein/Creat Ratio: 89 mg/g creat (ref 0–200)

## 2022-03-22 LAB — CULTURE, OB URINE

## 2022-03-22 LAB — URINE CULTURE, OB REFLEX

## 2022-03-26 ENCOUNTER — Encounter: Payer: Self-pay | Admitting: Obstetrics & Gynecology

## 2022-03-26 DIAGNOSIS — Z3480 Encounter for supervision of other normal pregnancy, unspecified trimester: Secondary | ICD-10-CM

## 2022-04-01 NOTE — Addendum Note (Signed)
Addended by: Natale Milch D on: 04/01/2022 04:02 PM   Modules accepted: Orders

## 2022-04-02 DIAGNOSIS — Z419 Encounter for procedure for purposes other than remedying health state, unspecified: Secondary | ICD-10-CM | POA: Diagnosis not present

## 2022-04-24 ENCOUNTER — Ambulatory Visit (INDEPENDENT_AMBULATORY_CARE_PROVIDER_SITE_OTHER): Payer: Medicaid Other | Admitting: Family Medicine

## 2022-04-24 VITALS — BP 114/66 | HR 86 | Wt 156.0 lb

## 2022-04-24 DIAGNOSIS — A6000 Herpesviral infection of urogenital system, unspecified: Secondary | ICD-10-CM

## 2022-04-24 DIAGNOSIS — R1032 Left lower quadrant pain: Secondary | ICD-10-CM

## 2022-04-24 DIAGNOSIS — O09299 Supervision of pregnancy with other poor reproductive or obstetric history, unspecified trimester: Secondary | ICD-10-CM

## 2022-04-24 DIAGNOSIS — Z3482 Encounter for supervision of other normal pregnancy, second trimester: Secondary | ICD-10-CM

## 2022-04-24 DIAGNOSIS — O09522 Supervision of elderly multigravida, second trimester: Secondary | ICD-10-CM

## 2022-04-24 DIAGNOSIS — Z3480 Encounter for supervision of other normal pregnancy, unspecified trimester: Secondary | ICD-10-CM

## 2022-04-24 DIAGNOSIS — O09523 Supervision of elderly multigravida, third trimester: Secondary | ICD-10-CM | POA: Insufficient documentation

## 2022-04-24 DIAGNOSIS — O09292 Supervision of pregnancy with other poor reproductive or obstetric history, second trimester: Secondary | ICD-10-CM

## 2022-04-24 DIAGNOSIS — Z3A15 15 weeks gestation of pregnancy: Secondary | ICD-10-CM

## 2022-04-24 NOTE — Progress Notes (Signed)
ROB c/o no appetite, LQ pain 6/10 x 3 weeks

## 2022-04-24 NOTE — Progress Notes (Signed)
    PRENATAL VISIT NOTE  Subjective:  Melanie Strong is a 36 y.o. G3P1011 at [redacted]w[redacted]d being seen today for ongoing prenatal care.  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  Patient reports  left lower abdominal pain .  Contractions: Not present. Vag. Bleeding: None.   . Denies leaking of fluid.   LLQ pain, around the groin, started about 3 weeks ago, feels like a constant dull ache. Improved with warm showers. Not worse with physical activity. No changes in bowel habit, no nausea or vomiting. Has reduced appetite. Works from home, and spends lots of time sitting. No exercise routinely.  The following portions of the patient's history were reviewed and updated as appropriate: allergies, current medications, past family history, past medical history, past social history, past surgical history and problem list.   Objective:   Vitals:   04/24/22 0853  BP: 114/66  Pulse: 86  Weight: 156 lb (70.8 kg)    Fetal Status: Fetal Heart Rate (bpm): 159 Fundal Height: 16 cm       General:  Alert, oriented and cooperative. Patient is in no acute distress.  Skin: Skin is warm and dry. No rash noted.   Cardiovascular: Normal heart rate noted  Respiratory: Normal respiratory effort, no problems with respiration noted  Abdomen: Soft, gravid, appropriate for gestational age.  Pain/Pressure: Present    Tender to palpation over LLQ and inguinal area. No rebound tenderness. No obvious mass noted  Pelvic: Cervical exam deferred        Extremities: Normal range of motion.  Edema: Trace  Mental Status: Normal mood  and affect. Normal behavior. Normal judgment and thought content.   Assessment and Plan:  Pregnancy: G3P1011 at [redacted]w[redacted]d 1. Hx of preeclampsia, prior pregnancy, currently pregnant BP normal today. On low dose aspirin daily, and consistent  2. Genital herpes simplex, unspecified site Plan for valacyclovir from 36 weeks for suppression of lesions  3. Supervision of other normal pregnancy, antepartum 4. [redacted] weeks gestation of pregnancy Doing well overall. AFP drawn today. Has anatomy scan scheduled.  5. Multigravida of advanced maternal age in second trimester   6. Left lower quadrant abdominal pain New concern over the past few weeks. Exam benign findings. Suspect round ligament pain vs ligament strain in inguinal area vs symptomatic ovarian cyst. - start with ice packs, topical aspercreme, stretching exercises and daily walks.  - has anatomy scan in about 3 weeks - consider sooner [place] scan if symptoms worsen.  Preterm labor symptoms and general obstetric precautions including but not limited to vaginal bleeding, contractions, leaking of fluid and fetal movement were reviewed in detail with the patient. Please refer to After Visit Summary for other counseling recommendations.   Return in 4 weeks (on 05/22/2022).  Future Appointments  Date Time Provider Department Center  05/16/2022  9:15 AM [facility] NURSE [facility]  05/16/2022  9:30 AM [facility] US2 [facility]  05/22/2022  8:55 AM Corlis Hove, NP [facility] None    Sheppard Evens MD MPH OB Fellow, Faculty Practice

## 2022-04-26 LAB — AFP, SERUM, OPEN SPINA BIFIDA
AFP MoM: 0.87
AFP Value: 30.4 ng/mL
Gest. Age on Collection Date: 15.6 weeks
Maternal Age At EDD: 35.9 yr
OSBR Risk 1 IN: 10000
Test Results:: NEGATIVE
Weight: 156 [lb_av]

## 2022-05-03 DIAGNOSIS — Z419 Encounter for procedure for purposes other than remedying health state, unspecified: Secondary | ICD-10-CM | POA: Diagnosis not present

## 2022-05-16 ENCOUNTER — Ambulatory Visit: Payer: Medicaid Other | Admitting: *Deleted

## 2022-05-16 ENCOUNTER — Ambulatory Visit: Payer: Medicaid Other | Attending: Obstetrics & Gynecology

## 2022-05-16 ENCOUNTER — Other Ambulatory Visit: Payer: Self-pay | Admitting: *Deleted

## 2022-05-16 VITALS — BP 114/60 | HR 77

## 2022-05-16 DIAGNOSIS — O09522 Supervision of elderly multigravida, second trimester: Secondary | ICD-10-CM | POA: Diagnosis not present

## 2022-05-16 DIAGNOSIS — Z3480 Encounter for supervision of other normal pregnancy, unspecified trimester: Secondary | ICD-10-CM | POA: Insufficient documentation

## 2022-05-16 DIAGNOSIS — O09899 Supervision of other high risk pregnancies, unspecified trimester: Secondary | ICD-10-CM

## 2022-05-16 DIAGNOSIS — Z3A19 19 weeks gestation of pregnancy: Secondary | ICD-10-CM | POA: Diagnosis not present

## 2022-05-16 DIAGNOSIS — Z363 Encounter for antenatal screening for malformations: Secondary | ICD-10-CM | POA: Insufficient documentation

## 2022-05-16 DIAGNOSIS — O34219 Maternal care for unspecified type scar from previous cesarean delivery: Secondary | ICD-10-CM | POA: Diagnosis not present

## 2022-05-16 DIAGNOSIS — O09292 Supervision of pregnancy with other poor reproductive or obstetric history, second trimester: Secondary | ICD-10-CM | POA: Insufficient documentation

## 2022-05-22 ENCOUNTER — Ambulatory Visit (INDEPENDENT_AMBULATORY_CARE_PROVIDER_SITE_OTHER): Payer: Medicaid Other | Admitting: Student

## 2022-05-22 VITALS — BP 103/62 | HR 82 | Wt 165.0 lb

## 2022-05-22 DIAGNOSIS — R102 Pelvic and perineal pain: Secondary | ICD-10-CM

## 2022-05-22 DIAGNOSIS — O09292 Supervision of pregnancy with other poor reproductive or obstetric history, second trimester: Secondary | ICD-10-CM

## 2022-05-22 DIAGNOSIS — Z148 Genetic carrier of other disease: Secondary | ICD-10-CM

## 2022-05-22 DIAGNOSIS — O26899 Other specified pregnancy related conditions, unspecified trimester: Secondary | ICD-10-CM

## 2022-05-22 DIAGNOSIS — O26892 Other specified pregnancy related conditions, second trimester: Secondary | ICD-10-CM

## 2022-05-22 DIAGNOSIS — Z98891 History of uterine scar from previous surgery: Secondary | ICD-10-CM

## 2022-05-22 DIAGNOSIS — Z3A19 19 weeks gestation of pregnancy: Secondary | ICD-10-CM

## 2022-05-22 DIAGNOSIS — O09892 Supervision of other high risk pregnancies, second trimester: Secondary | ICD-10-CM

## 2022-05-22 DIAGNOSIS — A6000 Herpesviral infection of urogenital system, unspecified: Secondary | ICD-10-CM

## 2022-05-22 DIAGNOSIS — D563 Thalassemia minor: Secondary | ICD-10-CM

## 2022-05-22 DIAGNOSIS — O09299 Supervision of pregnancy with other poor reproductive or obstetric history, unspecified trimester: Secondary | ICD-10-CM

## 2022-05-22 DIAGNOSIS — Z3480 Encounter for supervision of other normal pregnancy, unspecified trimester: Secondary | ICD-10-CM

## 2022-05-22 DIAGNOSIS — O09522 Supervision of elderly multigravida, second trimester: Secondary | ICD-10-CM

## 2022-05-22 NOTE — Progress Notes (Signed)
ROB c/o pelvic pain 7/10, no appetite.

## 2022-05-22 NOTE — Progress Notes (Signed)
   PRENATAL VISIT NOTE  Subjective:  Melanie Strong is a 36 y.o. G3P1011 at [redacted]w[redacted]d being seen today for ongoing prenatal care.  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  Patient reports pain in left lower quadrant. Contractions: Not present. Vag. Bleeding: None.  Movement: Present. Denies leaking of fluid.   The following portions of the patient's history were reviewed and updated as appropriate: allergies, current medications, past family history, past medical history, past social history, past surgical history and problem list.   Objective:   Vitals:   05/22/22 0859  BP: 103/62  Pulse: 82  Weight: 165 lb (74.8 kg)    Fetal Status: Fetal Heart Rate (bpm): 154   Movement: Present     General:  Alert, oriented and cooperative. Patient is in no acute distress.  Skin: Skin is warm and dry. No rash noted.   Cardiovascular: Normal heart rate noted  Respiratory: Normal respiratory effort, no problems with respiration noted  Abdomen: Soft, gravid, appropriate for gestational age.  Pain/Pressure: Present     Pelvic: Cervical exam deferred        Extremities: Normal range of motion.  Edema: Trace  Mental Status: Normal mood and affect. Normal behavior. Normal judgment and thought content.   Assessment and Plan:  Pregnancy: G3P1011 at [redacted]w[redacted]d 1. Supervision of other normal pregnancy, antepartum - Doing well, feels baby intermittently  2. [redacted] weeks gestation of pregnancy - Routine Follow-up in 4 weeks  3. Hx of preeclampsia, prior pregnancy, currently pregnant - Normal Bps today and at home -  Taking ASA  4. Genital herpes simplex, unspecified site - Assess for suppression at 36 weeks  5. Multigravida of advanced maternal age in second trimester 6. 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 Alpha thalassemia silent carrier 8. Carrier of spinal muscular atrophy - Had negative partner testing in prior pregnancy  9. History of cesarean section - VBAC candidate per Dr. Roselie Awkward  10. Short interval between pregnancies affecting pregnancy in second trimester, antepartum - F/U growth [place] scheduled 08/15/22  11. Pain of round ligament during pregnancy - Provided reassurance that this can be normal in pregnancy. Patient declines any further management at this time.   Preterm labor symptoms and general obstetric precautions including but not limited to vaginal bleeding, contractions, leaking of fluid and fetal movement were reviewed in detail with the patient. Please refer to After Visit Summary for other counseling recommendations.   Return in about 4 weeks (around 06/19/2022) for IN-PERSON, [place].  Future Appointments  Date Time Provider [place]  08/15/2022  8:30 AM [facility] NURSE [facility]  08/15/2022  8:45 AM [facility] US4 [facility]    Johnston Ebbs, NP

## 2022-06-02 DIAGNOSIS — Z419 Encounter for procedure for purposes other than remedying health state, unspecified: Secondary | ICD-10-CM | POA: Diagnosis not present

## 2022-06-19 ENCOUNTER — Encounter: Payer: Self-pay | Admitting: Obstetrics and Gynecology

## 2022-06-19 ENCOUNTER — Ambulatory Visit (INDEPENDENT_AMBULATORY_CARE_PROVIDER_SITE_OTHER): Payer: Medicaid Other | Admitting: Obstetrics and Gynecology

## 2022-06-19 VITALS — BP 120/73 | HR 85 | Wt 175.0 lb

## 2022-06-19 DIAGNOSIS — D563 Thalassemia minor: Secondary | ICD-10-CM

## 2022-06-19 DIAGNOSIS — O09299 Supervision of pregnancy with other poor reproductive or obstetric history, unspecified trimester: Secondary | ICD-10-CM

## 2022-06-19 DIAGNOSIS — Z98891 History of uterine scar from previous surgery: Secondary | ICD-10-CM

## 2022-06-19 DIAGNOSIS — O09522 Supervision of elderly multigravida, second trimester: Secondary | ICD-10-CM

## 2022-06-19 DIAGNOSIS — Z3480 Encounter for supervision of other normal pregnancy, unspecified trimester: Secondary | ICD-10-CM

## 2022-06-19 DIAGNOSIS — Z8619 Personal history of other infectious and parasitic diseases: Secondary | ICD-10-CM | POA: Insufficient documentation

## 2022-06-19 DIAGNOSIS — Z148 Genetic carrier of other disease: Secondary | ICD-10-CM

## 2022-06-19 HISTORY — DX: Personal history of other infectious and parasitic diseases: Z86.19

## 2022-06-19 HISTORY — DX: History of uterine scar from previous surgery: Z98.891

## 2022-06-19 NOTE — Progress Notes (Signed)
Subjective:  Melanie Strong is a 36 y.o. G3P1011 at [redacted]w[redacted]d being seen today for ongoing prenatal care.  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  Patient reports no complaints.  Contractions: Not present. Vag. Bleeding: None.  Movement: Present. Denies leaking of fluid.   The following portions of the patient's history were reviewed and updated as appropriate: allergies, current medications, past family history, past medical history, past social history, past surgical history and problem list. Problem list updated.  Objective:   Vitals:   06/19/22 1104  BP: 120/73  Pulse: 85  Weight: 175 lb (79.4 kg)    Fetal Status: Fetal Heart Rate (bpm): 160   Movement: Present     General:  Alert, oriented and cooperative. Patient is in no acute distress.  Skin: Skin is warm and dry. No rash noted.   Cardiovascular: Normal heart rate noted  Respiratory: Normal respiratory effort, no problems with respiration noted  Abdomen: Soft, gravid, appropriate for gestational age. Pain/Pressure: Present     Pelvic:  Cervical exam deferred        Extremities: Normal range of motion.     Mental Status: Normal mood and affect. Normal behavior. Normal judgment and thought content.   Urinalysis:      Assessment and Plan:  Pregnancy: G3P1011 at [redacted]w[redacted]d  1. Supervision of other normal pregnancy, antepartum Stable Glucola next visit  2. Multigravida of advanced maternal age in second trimester Stable  3. Hx of preeclampsia, prior pregnancy, currently pregnant BP  normal No S/Sx of SPEC Continue with qd BASA  4. History of cesarean section VBAC consent today  5. History of ELISA positive for HSV Suppression at 36 weeks  6. Alpha thalassemia silent carrier FOB testing prior pregnancy  7. Carrier of spinal muscular atrophy See above  8. 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 Please refer to After Visit Summary for other counseling recommendations.  Return in about 4 weeks (around 07/17/2022) for OB visit, face to face, MD only, fasting for Glucola.   Chancy Milroy, MD

## 2022-06-19 NOTE — Patient Instructions (Signed)

## 2022-07-03 DIAGNOSIS — Z419 Encounter for procedure for purposes other than remedying health state, unspecified: Secondary | ICD-10-CM | POA: Diagnosis not present

## 2022-07-09 NOTE — Telephone Encounter (Signed)
provided updated insurance information

## 2022-07-18 ENCOUNTER — Ambulatory Visit (INDEPENDENT_AMBULATORY_CARE_PROVIDER_SITE_OTHER): Payer: Medicaid Other | Admitting: Obstetrics and Gynecology

## 2022-07-18 ENCOUNTER — Other Ambulatory Visit: Payer: Medicaid Other

## 2022-07-18 VITALS — BP 112/72 | HR 85 | Wt 180.8 lb

## 2022-07-18 DIAGNOSIS — O09522 Supervision of elderly multigravida, second trimester: Secondary | ICD-10-CM

## 2022-07-18 DIAGNOSIS — Z148 Genetic carrier of other disease: Secondary | ICD-10-CM

## 2022-07-18 DIAGNOSIS — Z3480 Encounter for supervision of other normal pregnancy, unspecified trimester: Secondary | ICD-10-CM

## 2022-07-18 DIAGNOSIS — Z3A28 28 weeks gestation of pregnancy: Secondary | ICD-10-CM

## 2022-07-18 DIAGNOSIS — O09292 Supervision of pregnancy with other poor reproductive or obstetric history, second trimester: Secondary | ICD-10-CM

## 2022-07-18 DIAGNOSIS — D563 Thalassemia minor: Secondary | ICD-10-CM

## 2022-07-18 DIAGNOSIS — O09299 Supervision of pregnancy with other poor reproductive or obstetric history, unspecified trimester: Secondary | ICD-10-CM

## 2022-07-18 DIAGNOSIS — Z98891 History of uterine scar from previous surgery: Secondary | ICD-10-CM

## 2022-07-18 NOTE — Progress Notes (Signed)
   PRENATAL VISIT NOTE  Subjective:  Melanie Strong is a 36 y.o. G3P1011 at [redacted]w[redacted]d being seen today for ongoing prenatal care.  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  Patient doing well with no acute concerns today. She reports no complaints.  Contractions: Not present.  .  Movement: Present. Denies leaking of fluid.   The following portions of the patient's history were reviewed and updated as appropriate: allergies, current medications, past family history, past medical history, past social history, past surgical history and problem list. Problem list updated.  Objective:   Vitals:   07/18/22 0847  BP: 112/72  Pulse: 85  Weight: 180 lb 12.8 oz (82 kg)    Fetal Status: Fetal Heart Rate (bpm): 145 Fundal Height: 28 cm Movement: Present     General:  Alert, oriented and cooperative. Patient is in no acute distress.  Skin: Skin is warm and dry. No rash noted.   Cardiovascular: Normal heart rate noted  Respiratory: Normal respiratory effort, no problems with respiration noted  Abdomen: Soft, gravid, appropriate for gestational age.  Pain/Pressure: Present     Pelvic: Cervical exam deferred        Extremities: Normal range of motion.  Edema: Trace  Mental Status:  Normal mood and affect. Normal behavior. Normal judgment and thought content.   Assessment and Plan:  Pregnancy: G3P1011 at [redacted]w[redacted]d  1. Supervision of other normal pregnancy, antepartum Continue routine prenatal care  - Glucose Tolerance, 2 Hours w/1 Hour - RPR - HIV antibody  (with reflex) - CBC  2. [redacted] weeks gestation of pregnancy   3. Multigravida of advanced maternal age in second trimester   4. Hx of preeclampsia, prior pregnancy, currently pregnant No s/sx of preeclampsia  5. History of cesarean section Pt desires VBAC, consent prewviously signed  6. Carrier of spinal muscular atrophy   7. 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  Please refer to After Visit Summary for other counseling recommendations.   Return in about 2 weeks (around 08/01/2022) for [facility], in person.   Mariel Aloe, MD Faculty Attending Center for [facility]

## 2022-07-18 NOTE — Progress Notes (Addendum)
Pt reports good fetal movement. Declines tdap today.

## 2022-07-19 LAB — CBC
Hematocrit: 33.2 % — ABNORMAL LOW (ref 34.0–46.6)
Hemoglobin: 11.1 g/dL (ref 11.1–15.9)
MCH: 27.2 pg (ref 26.6–33.0)
MCHC: 33.4 g/dL (ref 31.5–35.7)
MCV: 81 fL (ref 79–97)
Platelets: 195 10*3/uL (ref 150–450)
RBC: 4.08 x10E6/uL (ref 3.77–5.28)
RDW: 14.4 % (ref 11.7–15.4)
WBC: 7.3 10*3/uL (ref 3.4–10.8)

## 2022-07-19 LAB — GLUCOSE TOLERANCE, 2 HOURS W/ 1HR
Glucose, 1 hour: 164 mg/dL (ref 70–179)
Glucose, 2 hour: 143 mg/dL (ref 70–152)
Glucose, Fasting: 85 mg/dL (ref 70–91)

## 2022-07-19 LAB — HIV ANTIBODY (ROUTINE TESTING W REFLEX): HIV Screen 4th Generation wRfx: NONREACTIVE

## 2022-07-19 LAB — RPR: RPR Ser Ql: NONREACTIVE

## 2022-08-01 ENCOUNTER — Ambulatory Visit (INDEPENDENT_AMBULATORY_CARE_PROVIDER_SITE_OTHER): Payer: Medicaid Other | Admitting: Obstetrics and Gynecology

## 2022-08-01 ENCOUNTER — Encounter: Payer: Self-pay | Admitting: Obstetrics and Gynecology

## 2022-08-01 VITALS — BP 118/72 | HR 93 | Wt 186.0 lb

## 2022-08-01 DIAGNOSIS — Z3480 Encounter for supervision of other normal pregnancy, unspecified trimester: Secondary | ICD-10-CM

## 2022-08-01 DIAGNOSIS — Z98891 History of uterine scar from previous surgery: Secondary | ICD-10-CM

## 2022-08-01 DIAGNOSIS — Z3A30 30 weeks gestation of pregnancy: Secondary | ICD-10-CM

## 2022-08-01 DIAGNOSIS — O09522 Supervision of elderly multigravida, second trimester: Secondary | ICD-10-CM

## 2022-08-01 DIAGNOSIS — O09299 Supervision of pregnancy with other poor reproductive or obstetric history, unspecified trimester: Secondary | ICD-10-CM

## 2022-08-01 DIAGNOSIS — O09293 Supervision of pregnancy with other poor reproductive or obstetric history, third trimester: Secondary | ICD-10-CM

## 2022-08-01 DIAGNOSIS — O09523 Supervision of elderly multigravida, third trimester: Secondary | ICD-10-CM

## 2022-08-01 NOTE — Progress Notes (Signed)
   PRENATAL VISIT NOTE  Subjective:  Melanie Strong is a 36 y.o. G3P1011 at [redacted]w[redacted]d being seen today for ongoing prenatal care.  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  Patient reports no complaints.  Contractions: Not present. Vag. Bleeding: None.  Movement: Present. Denies leaking of fluid.   The following portions of the patient's history were reviewed and updated as appropriate: allergies, current medications, past family history, past medical history, past social history, past surgical history and problem list.   Objective:   Vitals:   08/01/22 0834  BP: 118/72  Pulse: 93  Weight: 186 lb (84.4 kg)    Fetal Status: Fetal Heart Rate (bpm): 154 Fundal Height: 30 cm Movement: Present     General:  Alert, oriented and cooperative. Patient is in no acute distress.  Skin: Skin is warm and dry. No rash noted.   Cardiovascular: Normal heart rate noted  Respiratory: Normal respiratory effort, no problems with respiration noted  Abdomen: Soft, gravid, appropriate for gestational age.  Pain/Pressure: Present     Pelvic: Cervical exam deferred        Extremities: Normal range of motion.  Edema: Trace  Mental Status: Normal mood and affect. Normal behavior. Normal judgment and thought content.   Assessment and Plan:  Pregnancy: G3P1011 at [redacted]w[redacted]d 1. Supervision of other normal pregnancy, antepartum Patient is doing well without complaints Does not want contraception  2. History of cesarean section Desires TOLAC- consent previously signed  3. Hx  of preeclampsia, prior pregnancy, currently pregnant Normotensive and asymptomatic  4. Multigravida of advanced maternal age in second trimester Continue ASA Follow up growth ultrasound 12/14  Preterm labor symptoms and general obstetric precautions including but not limited to vaginal bleeding, contractions, leaking of fluid and fetal movement were reviewed in detail with the patient. Please refer to After Visit Summary for other counseling recommendations.   Return in about 2 weeks (around 08/15/2022) for in person, ROB, Low risk.  Future Appointments  Date Time Provider Department Center  08/15/2022  8:30 AM [facility] NURSE [facility]  08/15/2022  8:45 AM [facility] US4 [facility]  08/15/2022 10:55 AM Bellina Tokarczyk, Gigi Gin, MD [facility] None  08/29/2022  8:35 AM Warden Fillers, MD [facility] None  09/12/2022  8:35 AM Emoni Whitworth, Gigi Gin, MD [facility] None  09/19/2022  8:35 AM Rudy Domek, Gigi Gin, MD [facility] None  09/26/2022  8:35 AM Shawnae Leiva, Gigi Gin, MD [facility] None    Catalina Antigua, MD

## 2022-08-01 NOTE — Progress Notes (Signed)
ROB, c/o swelling in her hands.

## 2022-08-02 DIAGNOSIS — Z419 Encounter for procedure for purposes other than remedying health state, unspecified: Secondary | ICD-10-CM | POA: Diagnosis not present

## 2022-08-15 ENCOUNTER — Encounter: Payer: Self-pay | Admitting: Obstetrics and Gynecology

## 2022-08-15 ENCOUNTER — Encounter: Payer: Self-pay | Admitting: Obstetrics

## 2022-08-15 ENCOUNTER — Ambulatory Visit: Payer: Medicaid Other | Attending: Obstetrics and Gynecology

## 2022-08-15 ENCOUNTER — Other Ambulatory Visit: Payer: Self-pay

## 2022-08-15 ENCOUNTER — Ambulatory Visit: Payer: Medicaid Other

## 2022-08-15 ENCOUNTER — Ambulatory Visit (INDEPENDENT_AMBULATORY_CARE_PROVIDER_SITE_OTHER): Payer: Medicaid Other | Admitting: Obstetrics and Gynecology

## 2022-08-15 VITALS — BP 120/80 | HR 92 | Wt 190.0 lb

## 2022-08-15 VITALS — BP 115/70 | HR 99

## 2022-08-15 DIAGNOSIS — O09523 Supervision of elderly multigravida, third trimester: Secondary | ICD-10-CM | POA: Diagnosis not present

## 2022-08-15 DIAGNOSIS — O09899 Supervision of other high risk pregnancies, unspecified trimester: Secondary | ICD-10-CM | POA: Insufficient documentation

## 2022-08-15 DIAGNOSIS — O09293 Supervision of pregnancy with other poor reproductive or obstetric history, third trimester: Secondary | ICD-10-CM

## 2022-08-15 DIAGNOSIS — O09893 Supervision of other high risk pregnancies, third trimester: Secondary | ICD-10-CM | POA: Insufficient documentation

## 2022-08-15 DIAGNOSIS — O99013 Anemia complicating pregnancy, third trimester: Secondary | ICD-10-CM

## 2022-08-15 DIAGNOSIS — Z3A32 32 weeks gestation of pregnancy: Secondary | ICD-10-CM

## 2022-08-15 DIAGNOSIS — Z98891 History of uterine scar from previous surgery: Secondary | ICD-10-CM

## 2022-08-15 DIAGNOSIS — Z3480 Encounter for supervision of other normal pregnancy, unspecified trimester: Secondary | ICD-10-CM

## 2022-08-15 DIAGNOSIS — O34219 Maternal care for unspecified type scar from previous cesarean delivery: Secondary | ICD-10-CM | POA: Insufficient documentation

## 2022-08-15 DIAGNOSIS — D563 Thalassemia minor: Secondary | ICD-10-CM

## 2022-08-15 DIAGNOSIS — O09522 Supervision of elderly multigravida, second trimester: Secondary | ICD-10-CM

## 2022-08-15 DIAGNOSIS — O285 Abnormal chromosomal and genetic finding on antenatal screening of mother: Secondary | ICD-10-CM

## 2022-08-15 DIAGNOSIS — Z3483 Encounter for supervision of other normal pregnancy, third trimester: Secondary | ICD-10-CM

## 2022-08-15 DIAGNOSIS — Z148 Genetic carrier of other disease: Secondary | ICD-10-CM | POA: Diagnosis not present

## 2022-08-15 DIAGNOSIS — O99011 Anemia complicating pregnancy, first trimester: Secondary | ICD-10-CM

## 2022-08-15 DIAGNOSIS — O09299 Supervision of pregnancy with other poor reproductive or obstetric history, unspecified trimester: Secondary | ICD-10-CM

## 2022-08-15 NOTE — Progress Notes (Signed)
PRENATAL VISIT NOTE  Subjective:  Melanie Strong is a 36 y.o. G3P1011 at [redacted]w[redacted]d being seen today for ongoing prenatal care.  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  Patient reports no complaints.  Contractions: Not present. Vag. Bleeding: None.  Movement: Present. Denies leaking of fluid.   The following portions of the patient's history were reviewed and updated as appropriate: allergies, current medications, past family history, past medical history, past social history, past surgical history and problem list.   Objective:   Vitals:   08/15/22 1041  BP: 120/80  Pulse: 92  Weight: 190 lb (86.2 kg)    Fetal Status: Fetal Heart Rate (bpm): 145 Fundal Height: 32 cm Movement: Present     General:  Alert, oriented and cooperative. Patient is in no acute distress.  Skin: Skin is warm and dry. No rash noted.   Cardiovascular: Normal heart rate noted  Respiratory: Normal respiratory effort, no problems with respiration noted  Abdomen: Soft, gravid, appropriate for gestational age.  Pain/Pressure: Present     Pelvic: Cervical exam deferred        Extremities: Normal range of motion.     Mental Status: Normal mood and affect. Normal behavior. Normal judgment and thought content.  [place] [facility] OB FOLLOW UP  Result Date: 08/15/2022 ----------------------------------------------------------------------  OBSTETRICS REPORT                       (Signed Final 08/15/2022 09:30 am) ---------------------------------------------------------------------- Patient  Info  ID #:       [id number]                          D.O.B.:  [birth year] (36 yrs)  Name:       Melanie Strong                Visit Date: 08/15/2022 09:13 am ---------------------------------------------------------------------- Performed By  Attending:        Tama High MD        Ref. Address:     [facility]  Performed By:     B[facility] [place]          [facility]                                                             [place] [facility]                                                             [facility]  Referred By:      Griffin Basil MD ---------------------------------------------------------------------- Orders  #  Description                           Code        Ordered By  1  [place] MFM OB FOLLOW UP                   [phone]    Tama High ----------------------------------------------------------------------  #  Order #                     Accession #                Episode #  1  [id number]                   [id number]                 [id number] ---------------------------------------------------------------------- Indications  Advanced maternal age multigravida 65+,        O31.523  third trimester (36y.o)  Short interval between pregancies, 3rd         O09.893  trimester  Poor obstetric history: Previous preeclampsia  O09.299  Previous cesarean delivery, antepartum         O34.219  Genetic carrier (silent alpha thal, increased  Z14.8  risk for SMA, carrier for Beta-hgb/Beta thal))  Low risk NIPS, neg AFP  [redacted] weeks gestation of pregnancy                Z3A.32 ---------------------------------------------------------------------- Vital Signs  BP:          115/70 ---------------------------------------------------------------------- Fetal Evaluation  Num Of  Fetuses:         1  Fetal Heart Rate(bpm):  157  Cardiac Activity:       Observed  Presentation:           Cephalic  Placenta:               Posterior  P. Cord Insertion:      Previously Visualized  Amniotic Fluid  AFI FV:      Within normal limits  AFI Sum(cm)     %Tile       Largest Pocket(cm)  12.3            34          3.6  RUQ(cm)       RLQ(cm)       LUQ(cm)        LLQ(cm)  3.6           3.6  2.3            2.8 ---------------------------------------------------------------------- Biometry  BPD:      83.4  mm     G. Age:  33w 4d         84  %    CI:        75.57   %    70 - 86                                                          FL/HC:      19.7   %    19.1 - 21.3  HC:      304.2  mm     G. Age:  33w 6d         63  %    HC/AC:      1.09        0.96 - 1.17  AC:      279.7  mm     G. Age:  32w 0d         49  %    FL/BPD:     71.7   %    71 - 87  FL:       59.8  mm     G. Age:  31w 1d         17  %    FL/AC:      21.4   %    20 - 24  Est. FW:    1894  gm      4 lb 3 oz     41  % ---------------------------------------------------------------------- OB History  Gravidity:    3         Term:   1        Prem:   0        SAB:   1  TOP:          0       Ectopic:  0        Living: 1 ---------------------------------------------------------------------- Gestational Age  LMP:           32w 0d        Date:  01/03/22                   EDD:   10/10/22  U/S Today:     32w 5d                                        EDD:   10/05/22  Best:          Milderd Meager 0d     Det. By:  LMP  (01/03/22)          EDD:   10/10/22 ---------------------------------------------------------------------- Targeted Anatomy  Central Nervous System  Calvarium/Cranial V.:  Appears normal         Choroid Plexus:         Appears normal  Intracranial Anat:     Appears normal         Cereb./Vermis:          Appears normal  Cavum:  Appears normal         Cisterna Magna:         Appears normal  Lateral Ventricles:    Appears normal          Midline Falx:           Appears normal  Spine  Cervical:              Seen on prior scan     Lumbar:                 Seen on prior scan  Thoracic:              Seen on prior scan     Sacral:                 Seen on prior scan  Head/Neck  Face:                  Seen on prior scan     Palate:                 Seen on prior scan  Lips:                  Seen on prior scan     Profile:                Seen on prior scan  Neck:                  Seen on prior scan     Orbits/Eyes:            Seen on prior scan  Nuchal Fold:           Seen on prior scan     Mandible:               Seen on prior scan  Nasal Bone:            Seen on prior scan     Maxilla:                Seen on prior scan  Thorax  Thoracic Contour:      Appears normal         Ductal Arch:            Seen on prior scan  Lungs:                 Appears normal         SVC:                    Seen on prior scan  4 Chamber View:        Seen on prior scan     Cardiac Axis:           Seen on prior scan  Cardiac Rhythm:        Normal                 Diaphragm:              Appears normal  Rt Outflow Tract:      Seen on prior scan     3 Vessel View:          Seen on prior scan  Lt Outflow Tract:      Seen on prior scan     3 V Trachea View:       Seen on prior scan  Aortic Arch:  Seen on prior scan     IVC:                    Seen on prior scan  Abdomen  Ventral Wall:          Appears normal         Rt Kidney:              Appears normal  Cord Insertion:        Seen on prior scan     Bladder:                Appears normal  Stomach:               Appears normal         Bowel:                  Appears normal  Liver:                 Seen on prior scan     Spleen:                 Seen on prior scan  Lt Kidney:             Appears normal  Extremities  Lt Humerus:            Seen on prior scan     Lt Femur:               Seen on prior scan  Rt Humerus:            Seen on prior scan     Rt Femur:               Seen on prior scan  Lt Forearm:            Seen on prior  scan     Lt Lower Leg:           Seen on prior scan  Rt Forearm:            Seen on prior scan     Rt Lower Leg:           Seen on prior scan  Lt Hand:               Seen on prior scan     Lt Foot:                Seen on prior scan  Rt Hand:               Seen on prior scan     Rt Foot:                Seen on prior scan  Other  Umbilical Cord:        Seen on prior scan     Masses:                 None visualized  Comment:     No anomalies visualized. ---------------------------------------------------------------------- Cervix Uterus Adnexa  Adnexa  No abnormality visualized. ---------------------------------------------------------------------- Impression  Fetal growth is appropriate for gestational age .Amniotic fluid  is normal and good fetal activity is seen .Cephalic  presentation.   Patient does not have gestational diabetes .  Patient is keen on VBAC. ---------------------------------------------------------------------- Recommendations  -An appointment was made for her to return in 4 weeks for  fetal growth assessment (history of preeclampsia) ----------------------------------------------------------------------  Noralee Space, MD Electronically Signed Final Report   08/15/2022 09:30 am ----------------------------------------------------------------------   Assessment and Plan:  Pregnancy: G3P1011 at [redacted]w[redacted]d 1. Supervision of other normal pregnancy, antepartum Patient is doing well without complaints  2. History of cesarean section Plans TOLAC  3. Hx of preeclampsia, prior pregnancy, currently pregnant Normal BP and asymptomatic Follow up growth ultrasound  4. Multigravida of advanced maternal age in second trimester   5. Anemia affecting pregnancy in first trimester Continue iron supplement  Preterm labor symptoms and general obstetric precautions including but not limited to vaginal bleeding, contractions, leaking of fluid and fetal movement were reviewed in detail with  the patient. Please refer to After Visit Summary for other counseling recommendations.   No follow-ups on file.  Future Appointments  Date Time Provider Department Center  08/29/2022  8:35 AM Warden Fillers, MD [facility] None  09/12/2022  8:35 AM Dominique Ressel, Gigi Gin, MD [facility] None  09/18/2022  3:30 PM [facility] NURSE [facility]  09/18/2022  3:45 PM [facility] US4 [facility]  09/19/2022  8:35 AM Lutisha Knoche, Gigi Gin, MD [facility] None  09/26/2022  8:35 AM Blakelynn Scheeler, Gigi Gin, MD [facility] None    Catalina Antigua, MD

## 2022-08-20 ENCOUNTER — Encounter (HOSPITAL_COMMUNITY): Payer: Self-pay | Admitting: Obstetrics and Gynecology

## 2022-08-20 ENCOUNTER — Inpatient Hospital Stay (HOSPITAL_COMMUNITY)
Admission: AD | Admit: 2022-08-20 | Discharge: 2022-08-20 | Disposition: A | Discharge status: Home / Self Care | Payer: Medicaid Other | Attending: Obstetrics & Gynecology | Admitting: Obstetrics & Gynecology

## 2022-08-20 DIAGNOSIS — O479 False labor, unspecified: Secondary | ICD-10-CM | POA: Diagnosis not present

## 2022-08-20 DIAGNOSIS — Z3A32 32 weeks gestation of pregnancy: Secondary | ICD-10-CM

## 2022-08-20 DIAGNOSIS — O4703 False labor before 37 completed weeks of gestation, third trimester: Secondary | ICD-10-CM | POA: Insufficient documentation

## 2022-08-20 DIAGNOSIS — R109 Unspecified abdominal pain: Secondary | ICD-10-CM | POA: Insufficient documentation

## 2022-08-20 DIAGNOSIS — O26899 Other specified pregnancy related conditions, unspecified trimester: Secondary | ICD-10-CM

## 2022-08-20 DIAGNOSIS — O26893 Other specified pregnancy related conditions, third trimester: Secondary | ICD-10-CM | POA: Insufficient documentation

## 2022-08-20 DIAGNOSIS — Z3480 Encounter for supervision of other normal pregnancy, unspecified trimester: Secondary | ICD-10-CM

## 2022-08-20 DIAGNOSIS — O09523 Supervision of elderly multigravida, third trimester: Secondary | ICD-10-CM | POA: Insufficient documentation

## 2022-08-20 LAB — URINALYSIS, ROUTINE W REFLEX MICROSCOPIC
Bilirubin Urine: NEGATIVE
Glucose, UA: NEGATIVE mg/dL
Hgb urine dipstick: NEGATIVE
Ketones, ur: NEGATIVE mg/dL
Nitrite: NEGATIVE
Protein, ur: NEGATIVE mg/dL
Specific Gravity, Urine: 1.015 (ref 1.005–1.030)
pH: 6 (ref 5.0–8.0)

## 2022-08-20 LAB — WET PREP, GENITAL
Clue Cells Wet Prep HPF POC: NONE SEEN
Sperm: NONE SEEN
Trich, Wet Prep: NONE SEEN
WBC, Wet Prep HPF POC: >=10 — AB (ref <10)
Yeast Wet Prep HPF POC: NONE SEEN

## 2022-08-20 MED ORDER — NIFEDIPINE 10 MG PO CAPS
10.0000 mg | ORAL_CAPSULE | ORAL | Status: DC | PRN
  Administered 2022-08-20 (×2): 10 mg via ORAL
  Filled 2022-08-20 (×3): qty 1

## 2022-08-20 MED ORDER — CYCLOBENZAPRINE HCL 5 MG PO TABS
10.0000 mg | ORAL_TABLET | Freq: Once | ORAL | Status: AC
  Administered 2022-08-20: 10 mg via ORAL
  Filled 2022-08-20: qty 2

## 2022-08-20 MED ORDER — LACTATED RINGERS IV BOLUS
1000.0000 mL | Freq: Once | INTRAVENOUS | Status: AC
  Administered 2022-08-20: 1000 mL via INTRAVENOUS

## 2022-08-20 MED ORDER — ACETAMINOPHEN 500 MG PO TABS
1000.0000 mg | ORAL_TABLET | Freq: Once | ORAL | Status: AC
  Administered 2022-08-20: 1000 mg via ORAL
  Filled 2022-08-20: qty 2

## 2022-08-20 NOTE — MAU Note (Signed)
Patient signed physical copy of AVS. 

## 2022-08-20 NOTE — MAU Note (Signed)
...  Melanie Strong is a 36 y.o. at [redacted]w[redacted]d here in MAU reporting: She reports since yesterday she has felt extremely fatigued and "drained." She reports it was hard for her to sleep during the night last night and around 0200 she began experiencing intermittent lower abdominal pains accompanied with abdominal tightness. She reports she is unsure if she is having contractions. She is also endorsing a constant pelvic pain as well as vaginal pressure that is worse with position changes. Denies VB, vaginal discharge, and LOF. Endorses rigorous fetal movement.  Anemia in pregnancy. Hx Pre-E. Hx C/S due to fetal intolerance to labor per patient. Patient plans to [facility].  Pain score:  8/10 lower abdomen 5/10 pelvis/vagina  FHT: 125 initial external Lab orders placed from triage:  UA

## 2022-08-20 NOTE — MAU Provider Note (Signed)
History     CSN: [id number]  Arrival date and time: 08/20/22 0955   Event Date/Time   First Provider Initiated Contact with Patient 08/20/22 1035      Chief Complaint  Patient presents with   Abdominal Pain   Pelvic Pain   Contractions   Melanie Strong , a  36 y.o. G3P1011 at [redacted]w[redacted]d presents to MAU with complaints of abdominal pain and pressure since 2 am. Patient reports that around 2am she was awakened from sleep with intermittent pain that last about 1-2 mins. Patient states that they have increased throughout the morning. Describes pain as "pressure and tightening". Currently rates pain as a 8/10 "when it comes."  Denies attempting to relieve pain at home. She denies urinary and vaginal symptoms. Denies vaginal bleeding, leaking of fluid and endorses positive fetal movement.             OB History     Gravida  3   Para  1   Term  1   Preterm      AB  1   Living  1      SAB  1   IAB      Ectopic      Multiple  0   Live Births  1           Past Medical History:  Diagnosis Date   Anemia    Genital herpes 07/11/2021   Glucose found in urine on examination    Infection    UTI    Past Surgical History:  Procedure Laterality Date   CESAREAN SECTION  08/23/2021   Procedure: CESAREAN SECTION;  Surgeon: [name], MD;  Location: [facility] LD [facility];  Service: Obstetrics;;   NO PAST SURGERIES      Family History  Problem Relation Age of Onset   Hypertension Mother    Diabetes Mother        Boarderline   Lupus Mother    Cancer Father        rectal   Sickle cell anemia Sister    Healthy Brother    Healthy Brother    Cancer Paternal Aunt        brain   Diabetes Maternal Grandmother    Other Neg Hx    Asthma Neg Hx    Heart disease Neg Hx    Stroke Neg Hx     Social History   Tobacco Use   Smoking status: Every Day    Packs/day: 0.25    Years: 10.00    Total pack years: 2.50    Types: Cigarettes   Smokeless tobacco: Never   Vaping Use   Vaping Use: Never used  Substance Use Topics   Alcohol use: No    Comment: rare   Drug use: No    Allergies: No Known Allergies  Medications Prior to Admission  Medication Sig Dispense Refill Last Dose   aspirin EC 81 MG tablet Take 1 tablet (81 mg total) by mouth daily. Swallow whole. 30 tablet 12 08/20/2022   Prenatal Vit-Fe Fumarate-FA (TRINATE) TABS Take 1 tablet by mouth daily before breakfast. 30 tablet 11 08/20/2022    Review of Systems  Constitutional:  Negative for chills, fatigue and fever.  Eyes:  Negative for pain and visual disturbance.  Respiratory:  Negative for apnea, shortness of breath and wheezing.   Cardiovascular:  Negative for chest pain and palpitations.  Gastrointestinal:  Positive for abdominal pain. Negative for constipation, diarrhea, nausea and vomiting.  Genitourinary:  Positive for vaginal pain. Negative for difficulty urinating, dysuria, pelvic pain, vaginal bleeding and vaginal discharge.  Musculoskeletal:  Negative for back pain.  Neurological:  Negative for seizures, weakness and headaches.  Psychiatric/Behavioral:  Negative for suicidal ideas.    Physical Exam   Blood pressure 133/77, pulse 97, temperature 98.9 F (37.2 C), temperature source Oral, resp. rate 20, last menstrual period 01/03/2022, SpO2 100 %, currently breastfeeding.  Physical Exam Vitals and nursing note reviewed. Exam conducted with a chaperone present.  Constitutional:      General: She is not in acute distress.    Appearance: Normal appearance.  HENT:     Head: Normocephalic.  Pulmonary:     Effort: Pulmonary effort is normal.  Abdominal:     Palpations: Abdomen is soft.     Tenderness: There is abdominal tenderness in the right lower quadrant and left lower quadrant.     Comments: Pregnant; Tenderness located lower abdominal not in pelvis.   Genitourinary:    Vagina: Tenderness present.     Cervix: Normal.  Musculoskeletal:     Cervical back:  Normal range of motion.  Skin:    General: Skin is warm and dry.     Capillary Refill: Capillary refill takes less than 2 seconds.  Neurological:     Mental Status: She is alert and oriented to person, place, and time.  Psychiatric:        Mood and Affect: Mood normal.    Cervix 0.5. thick and high. Posterior. Rhunette Croft CNM   FHT: 135 bpm with moderate variability. 15x15 accels no decels noted.  Toco : UI difficult to trace.   MAU Course  Procedures Orders Placed This Encounter  Procedures   Wet prep, genital   Culture, OB Urine   Urinalysis, Routine w reflex microscopic Urine, Clean Catch   Insert peripheral IV   Meds ordered this encounter  Medications   lactated ringers bolus 1,000 mL   NIFEdipine (PROCARDIA) capsule 10 mg   acetaminophen (TYLENOL) tablet 1,000 mg   cyclobenzaprine (FLEXERIL) tablet 10 mg    MDM - UA reflexed to Culture  - Wet prep positive for WBC other wise normal  - Cervix closed.  - Occasional contractions ceased with IV fluids.  - Pain improved with PO Procardia, Tylenol and Flexeril.  - Patient reports she hasn't felt "contractions since dose of procardia."  - Toco noted UI without contractions.   - Plan for discharge  Assessment and Plan   1. False labor   2. Supervision of other normal pregnancy, antepartum   3. Abdominal pain affecting pregnancy   4. [redacted] weeks gestation of pregnancy    - Discussed that dehydration can cause contractions. Recommended to decrease grape soda intake and replace with water.  - Reviewed signs and symptoms of labor.  - FHT Appropriate for gestation age upon discharge.  - Worsening signs and return precautions reviewed.  - Patient discharged home in stable condition and may return to MAU as needed.   Claudette Head, MSN CNM  08/20/2022, 10:35 AM

## 2022-08-21 ENCOUNTER — Telehealth: Payer: Self-pay | Admitting: Emergency Medicine

## 2022-08-21 ENCOUNTER — Ambulatory Visit: Payer: Self-pay

## 2022-08-21 LAB — CULTURE, OB URINE: Culture: NO GROWTH

## 2022-08-21 LAB — GC/CHLAMYDIA PROBE AMP (~~LOC~~) NOT AT ARMC
Chlamydia: NEGATIVE
Comment: NEGATIVE
Comment: NORMAL
Neisseria Gonorrhea: NEGATIVE

## 2022-08-21 NOTE — Telephone Encounter (Signed)
TC from patient with concens of contractions 6-8 mins apart.  States they are becoming further apart and not as severe. Given Precuations to go to MAU. Instructed

## 2022-08-21 NOTE — Telephone Encounter (Signed)
  Chief Complaint: contractions every 6-8 minutes 32 weeks 6 days Symptoms: denies other sx Frequency: - Pertinent Negatives: Patient denies leaking fluid from vagina, fever, hand/facial swelling Disposition: [x] ED /[] Urgent Care (no appt availability in office) / [] Appointment(In office/virtual)/ []  [facility]/ [] Home Care/ [] Refused Recommended Disposition /[] [facility]/ []  Follow-up with PCP Additional Notes: tried water and laying on side but contractions went away and came back. Pt stated she called her OB and unable to reach provider. Reason for Disposition  [1] Contractions > 10 minutes apart AND [2] persist > 24 hours AND [3] no improvement using Care Advice  Answer Assessment - Initial Assessment Questions 1. ONSET: "When did the symptoms begin?"        *No Answer* 2. CONTRACTIONS: "Describe the contractions that you are having." (e.g., duration, frequency, regularity, severity)     Every 6-8 minutes 3. PREGNANCY: "How many weeks pregnant are you?"     32  6 days  4. EDD: "What date are you expecting to deliver?"     10/10/21 5. PARITY: "Have you had a baby before?" If Yes, ask: "How long did the labor last?"     Yes-  6. FETAL MOVEMENT: "Has the baby's movement decreased or changed significantly from normal?"     no 7. OTHER SYMPTOMS: "Do you have any other symptoms?" (e.g., leaking fluid from vagina, fever, hand/facial swelling)     N/a  Protocols used: Pregnancy - Labor - Preterm-A-AH

## 2022-08-22 ENCOUNTER — Inpatient Hospital Stay (HOSPITAL_COMMUNITY)
Admission: AD | Admit: 2022-08-22 | Discharge: 2022-08-22 | Disposition: A | Discharge status: Home / Self Care | Payer: Medicaid Other | Attending: Obstetrics and Gynecology | Admitting: Obstetrics and Gynecology

## 2022-08-22 ENCOUNTER — Encounter (HOSPITAL_COMMUNITY): Payer: Self-pay | Admitting: Obstetrics and Gynecology

## 2022-08-22 DIAGNOSIS — O09523 Supervision of elderly multigravida, third trimester: Secondary | ICD-10-CM | POA: Diagnosis not present

## 2022-08-22 DIAGNOSIS — R109 Unspecified abdominal pain: Secondary | ICD-10-CM

## 2022-08-22 DIAGNOSIS — Z3A33 33 weeks gestation of pregnancy: Secondary | ICD-10-CM | POA: Diagnosis not present

## 2022-08-22 DIAGNOSIS — O4700 False labor before 37 completed weeks of gestation, unspecified trimester: Secondary | ICD-10-CM | POA: Diagnosis not present

## 2022-08-22 DIAGNOSIS — O26893 Other specified pregnancy related conditions, third trimester: Secondary | ICD-10-CM | POA: Diagnosis not present

## 2022-08-22 MED ORDER — NIFEDIPINE 10 MG PO CAPS: 10.0000 mg | ORAL_CAPSULE | Freq: Three times a day (TID) | ORAL | 1 refills | Status: DC

## 2022-08-22 MED ORDER — NIFEDIPINE 10 MG PO CAPS
10.0000 mg | ORAL_CAPSULE | ORAL | Status: AC | PRN
  Administered 2022-08-22 (×3): 10 mg via ORAL
  Filled 2022-08-22 (×3): qty 1

## 2022-08-22 MED ORDER — ACETAMINOPHEN 500 MG PO TABS
1000.0000 mg | ORAL_TABLET | Freq: Once | ORAL | Status: AC
  Administered 2022-08-22: 1000 mg via ORAL
  Filled 2022-08-22: qty 2

## 2022-08-22 MED ORDER — CYCLOBENZAPRINE HCL 5 MG PO TABS: 5.0000 mg | ORAL_TABLET | Freq: Three times a day (TID) | ORAL | 0 refills | Status: DC | PRN

## 2022-08-22 NOTE — MAU Provider Note (Signed)
History     CSN: [id number]  Arrival date and time: 08/22/22 7741   Event Date/Time   First Provider Initiated Contact with Patient 08/22/22 [phone]      Chief Complaint  Patient presents with   Contractions   Melanie Strong , a  36 y.o. G3P1011 at [redacted]w[redacted]d presents to MAU with complaints of contractions since last night at 9pm. Patient states they have been on-going since 2 days ago and states that they had spread out to 8-10 mins, but after 10pm patient states "they were just coming." Denies attempting to relieve pain. Denies vaginal bleeding leaking of fluid. Denies urinary symptoms. Endorses positive fetal movement.          OB History     Gravida  3   Para  1   Term  1   Preterm      AB  1   Living  1      SAB  1   IAB      Ectopic      Multiple  0   Live Births  1           Past Medical History:  Diagnosis Date   Anemia    Genital herpes 07/11/2021   Glucose found in urine on examination    Infection    UTI    Past Surgical History:  Procedure Laterality Date   CESAREAN SECTION  08/23/2021   Procedure: CESAREAN SECTION;  Surgeon: [name], MD;  Location: [facility];  Service: Obstetrics;;   NO PAST SURGERIES      Family History  Problem Relation Age of Onset   Hypertension Mother    Diabetes Mother        Boarderline   Lupus Mother    Cancer Father        rectal   Sickle cell anemia Sister    Healthy Brother    Healthy Brother    Cancer Paternal Aunt        brain   Diabetes Maternal Grandmother    Other Neg Hx    Asthma Neg Hx    Heart disease Neg Hx    Stroke Neg Hx     Social History   Tobacco Use   Smoking status: Every Day    Packs/day: 0.25    Years: 10.00    Total pack years: 2.50    Types: Cigarettes   Smokeless tobacco: Never  Vaping Use   Vaping Use: Never used  Substance Use Topics   Alcohol use: No    Comment: rare   Drug use: No    Allergies: No Known Allergies  Medications Prior to  Admission  Medication Sig Dispense Refill Last Dose   aspirin EC 81 MG tablet Take 1 tablet (81 mg total) by mouth daily. Swallow whole. 30 tablet 12 08/21/2022 at 0700   Prenatal Vit-Fe Fumarate-FA (TRINATE) TABS Take 1 tablet by mouth daily before breakfast. 30 tablet 11 08/21/2022 at 0700    Review of Systems  Constitutional:  Negative for chills, fatigue and fever.  Eyes:  Negative for pain and visual disturbance.  Respiratory:  Negative for apnea, shortness of breath and wheezing.   Cardiovascular:  Negative for chest pain and palpitations.  Gastrointestinal:  Positive for abdominal pain. Negative for constipation, diarrhea, nausea and vomiting.  Genitourinary:  Negative for difficulty urinating, dysuria, pelvic pain, vaginal bleeding, vaginal discharge and vaginal pain.  Musculoskeletal:  Negative for back pain.  Neurological:  Negative  for seizures, weakness and headaches.  Psychiatric/Behavioral:  Negative for suicidal ideas.    Physical Exam   Blood pressure 123/69, pulse 86, temperature 97.9 F (36.6 C), temperature source Oral, resp. rate 20, height 5\' 5"  (1.651 m), weight 87.3 kg, last menstrual period 01/03/2022, SpO2 99 %, currently breastfeeding.  Physical Exam Vitals and nursing note reviewed. Exam conducted with a chaperone present.  Constitutional:      General: She is not in acute distress.    Appearance: Normal appearance.  HENT:     Head: Normocephalic.  Pulmonary:     Effort: Pulmonary effort is normal.  Abdominal:     Palpations: Abdomen is soft.     Comments: 1 contraction palpated at bedside mild to palpation.   Genitourinary:    General: Normal vulva.  Musculoskeletal:        General: Normal range of motion.     Cervical back: Normal range of motion.  Skin:    General: Skin is warm and dry.     Capillary Refill: Capillary refill takes less than 2 seconds.  Neurological:     Mental Status: She is alert and oriented to person, place, and time.   Psychiatric:        Mood and Affect: Mood normal.    Dilation: 1 Effacement (%): Thick Cervical Position: Posterior Exam by:: [email or web address], CNM  FHT: 135 bpm with moderate variability accels present. Occasional variables (appropriate for gestational age.)  Toco UI contractions difficult to trace.    MAU Course  Procedures Meds ordered this encounter  Medications   acetaminophen (TYLENOL) tablet 1,000 mg   NIFEdipine (PROCARDIA) capsule 10 mg    MDM Patient drove to MAU. PO Tylenol ordered  Will send Rx for PO flexeril . Procardia series initiated.  Patient reports pain 0/10 following procardia series and PO Tylenol.  Dilation: 1 Effacement (%): Thick Cervical Position: Posterior Exam by:: [email or web address], CNM Exam unchanged Plan for discharge   Assessment and Plan   1. Preterm uterine contractions   2. [redacted] weeks gestation of pregnancy   3. Abdominal cramps    - Recommended PO Tylenol and Flexeril for pain and to aid with sleep at night.  - Recommended PO Procardia for PTL. Admin instructions provided at patient bedside.  - Worsening signs and return precautions reviewed  - Preterm Labor precautions reviewed.  - FHT appropriate for gestational age upon discharge - Patient discharged home in stable condition and may return to MAU as needed.   Rhunette Croft, MSN CNM  08/22/2022, 6:46 AM

## 2022-08-22 NOTE — MAU Note (Signed)
..  Melanie Strong is a 36 y.o. at [redacted]w[redacted]d here in MAU reporting: has had contractions all night that are every 3 minutes Was here on Tuesday and was a fin Denies vaginal bleeding or leaking of fluid. +FM  Pain score: 9/10 Vitals:   08/22/22 0624  BP: 131/67  Pulse: 87  Resp: 20  Temp: 97.9 F (36.6 C)  SpO2: 99%     FHT:142 Lab orders placed from triage:  ua

## 2022-08-23 DIAGNOSIS — Z3483 Encounter for supervision of other normal pregnancy, third trimester: Secondary | ICD-10-CM | POA: Diagnosis not present

## 2022-08-23 DIAGNOSIS — Z3482 Encounter for supervision of other normal pregnancy, second trimester: Secondary | ICD-10-CM | POA: Diagnosis not present

## 2022-08-29 ENCOUNTER — Ambulatory Visit (INDEPENDENT_AMBULATORY_CARE_PROVIDER_SITE_OTHER): Payer: Medicaid Other | Admitting: Obstetrics and Gynecology

## 2022-08-29 VITALS — BP 117/66 | HR 89 | Wt 191.0 lb

## 2022-08-29 DIAGNOSIS — O09293 Supervision of pregnancy with other poor reproductive or obstetric history, third trimester: Secondary | ICD-10-CM

## 2022-08-29 DIAGNOSIS — Z3A34 34 weeks gestation of pregnancy: Secondary | ICD-10-CM

## 2022-08-29 DIAGNOSIS — Z148 Genetic carrier of other disease: Secondary | ICD-10-CM

## 2022-08-29 DIAGNOSIS — O09299 Supervision of pregnancy with other poor reproductive or obstetric history, unspecified trimester: Secondary | ICD-10-CM

## 2022-08-29 DIAGNOSIS — Z8619 Personal history of other infectious and parasitic diseases: Secondary | ICD-10-CM

## 2022-08-29 DIAGNOSIS — Z98891 History of uterine scar from previous surgery: Secondary | ICD-10-CM

## 2022-08-29 DIAGNOSIS — Z3480 Encounter for supervision of other normal pregnancy, unspecified trimester: Secondary | ICD-10-CM

## 2022-08-29 DIAGNOSIS — D563 Thalassemia minor: Secondary | ICD-10-CM

## 2022-08-29 NOTE — Progress Notes (Signed)
Pt here for ROB, and c/o frequent contractions.

## 2022-08-29 NOTE — Progress Notes (Signed)
   PRENATAL VISIT NOTE  Subjective:  Melanie Strong is a 36 y.o. G3P1011 at [redacted]w[redacted]d being seen today for ongoing prenatal care.  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  Patient doing well with no acute concerns today. She reports  several rounds of irregular contractions.  Contractions: Irregular. Vag. Bleeding: None.  Movement: Present. Denies leaking of fluid.   The following portions of the patient's history were reviewed and updated as appropriate: allergies, current medications, past family history, past medical history, past social history, past surgical history and problem list. Problem list updated.  Objective:   Vitals:   08/29/22 0852  BP: 117/66  Pulse: 89  Weight: 191 lb (86.6 kg)    Fetal Status: Fetal Heart Rate (bpm): 145 Fundal Height: 34 cm Movement: Present     General:  Alert, oriented and cooperative. Patient is in no acute distress.  Skin: Skin is warm and dry. No rash noted.   Cardiovascular: Normal heart rate noted  Respiratory: Normal respiratory effort, no problems with respiration noted  Abdomen: Soft, gravid, appropriate for gestational age.  Pain/Pressure: Present     Pelvic: Cervical exam deferred        Extremities: Normal range of motion.  Edema: None  Mental Status:  Normal mood and affect. Normal behavior. Normal judgment and thought content.   Assessment and Plan:  Pregnancy: G3P1011 at [redacted]w[redacted]d  1. [redacted] weeks gestation of pregnancy   2. Supervision of other normal pregnancy, antepartum Continue routine  prenatal care Discussed preterm contractions.  Pt states they usually resolve at home with use of procardia or flexeril.  Pt advised if they break through medications she should be assessed at [facility]  3. Hx of preeclampsia, prior pregnancy, currently pregnant No s/sx preeclampsia  4. History of ELISA positive for HSV Consider valtrex at 36 weeks  5. History of cesarean section Pt desires TOLAC, VBAC sheet given  6. Carrier of spinal muscular atrophy   7. 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 Alpha thalassemia silent carrier   Preterm labor symptoms and general obstetric precautions including but not limited to vaginal bleeding, contractions, leaking of fluid and fetal movement were reviewed in detail with the patient.  Please refer to After Visit Summary for other counseling recommendations.   Return in about 2 years (around 08/29/2024) for [facility], in person, 36 weeks swabs.   Mariel Aloe, MD Faculty Attending Center for [facility]

## 2022-09-02 DIAGNOSIS — Z419 Encounter for procedure for purposes other than remedying health state, unspecified: Secondary | ICD-10-CM | POA: Diagnosis not present

## 2022-09-12 ENCOUNTER — Ambulatory Visit (INDEPENDENT_AMBULATORY_CARE_PROVIDER_SITE_OTHER): Payer: Medicaid Other | Admitting: Obstetrics and Gynecology

## 2022-09-12 ENCOUNTER — Other Ambulatory Visit (HOSPITAL_COMMUNITY)
Admission: RE | Admit: 2022-09-12 | Discharge: 2022-09-12 | Disposition: A | Discharge status: Home / Self Care | Payer: Medicaid Other | Source: Ambulatory Visit | Attending: Obstetrics and Gynecology | Admitting: Obstetrics and Gynecology

## 2022-09-12 ENCOUNTER — Encounter: Payer: Self-pay | Admitting: Obstetrics and Gynecology

## 2022-09-12 VITALS — BP 119/78 | HR 92 | Wt 195.0 lb

## 2022-09-12 DIAGNOSIS — O99013 Anemia complicating pregnancy, third trimester: Secondary | ICD-10-CM

## 2022-09-12 DIAGNOSIS — Z3480 Encounter for supervision of other normal pregnancy, unspecified trimester: Secondary | ICD-10-CM | POA: Diagnosis not present

## 2022-09-12 DIAGNOSIS — O99011 Anemia complicating pregnancy, first trimester: Secondary | ICD-10-CM

## 2022-09-12 DIAGNOSIS — O09299 Supervision of pregnancy with other poor reproductive or obstetric history, unspecified trimester: Secondary | ICD-10-CM

## 2022-09-12 DIAGNOSIS — Z8619 Personal history of other infectious and parasitic diseases: Secondary | ICD-10-CM

## 2022-09-12 DIAGNOSIS — Z3A36 36 weeks gestation of pregnancy: Secondary | ICD-10-CM

## 2022-09-12 DIAGNOSIS — O09523 Supervision of elderly multigravida, third trimester: Secondary | ICD-10-CM

## 2022-09-12 DIAGNOSIS — Z98891 History of uterine scar from previous surgery: Secondary | ICD-10-CM

## 2022-09-12 DIAGNOSIS — O09293 Supervision of pregnancy with other poor reproductive or obstetric history, third trimester: Secondary | ICD-10-CM

## 2022-09-12 DIAGNOSIS — O09522 Supervision of elderly multigravida, second trimester: Secondary | ICD-10-CM

## 2022-09-12 NOTE — Progress Notes (Signed)
   PRENATAL VISIT NOTE  Subjective:  Melanie Strong is a 37 y.o. G3P1011 at [redacted]w[redacted]d being seen today for ongoing prenatal care.  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  Patient reports no complaints.  Contractions: Irregular. Vag. Bleeding: None.  Movement: Present. Denies leaking of fluid.   The following portions of the patient's history were reviewed and updated as appropriate: allergies, current medications, past family history, past medical history, past social history, past surgical history and problem list.   Objective:   Vitals:   09/12/22 0845  BP: 119/78  Pulse: 92  Weight: 195 lb (88.5 kg)    Fetal Status: Fetal Heart Rate (bpm): 140 Fundal Height: 36 cm Movement: Present     General:  Alert, oriented and cooperative. Patient is in no acute distress.  Skin: Skin is warm and dry. No rash noted.   Cardiovascular: Normal heart rate noted  Respiratory: Normal respiratory effort, no problems with respiration noted  Abdomen: Soft, gravid, appropriate for gestational age.  Pain/Pressure: Present     Pelvic: Cervical exam performed in the presence of a chaperone Dilation: 1.5 Effacement (%): 40 Station: Ballotable  Extremities: Normal range of motion.     Mental Status: Normal mood and affect. Normal behavior. Normal judgment and thought content.   Assessment and Plan:  Pregnancy: G3P1011 at [redacted]w[redacted]d 1. Supervision of other normal pregnancy, antepartum Patient is doing well without complaints Cultures collected today  2. History of  cesarean section Desires TOLAC- consent previously signed  3. Anemia affecting pregnancy in first trimester Continue iron supplement  4. Multigravida of advanced maternal age in second trimester Follow up growth ultrasound on 1/17  5. Hx of preeclampsia, prior pregnancy, currently pregnant Normotensive and without symptoms  6. History of ELISA positive for HSV Continue prophylaxis  Preterm labor symptoms and general obstetric precautions including but not limited to vaginal bleeding, contractions, leaking of fluid and fetal movement were reviewed in detail with the patient. Please refer to After Visit Summary for other counseling recommendations.   Return in about 1 week (around 09/19/2022) for in person, ROB, Low risk.  Future Appointments  Date Time Provider [place]  09/18/2022  3:30 PM [facility] NURSE [facility]  09/18/2022  3:45 PM [facility]  09/19/2022  8:35 AM Aneshia Jacquet, Vickii Chafe, MD [facility] None  09/26/2022  8:35 AM Otie Headlee, Vickii Chafe, MD [facility] None    Mora Bellman, MD

## 2022-09-12 NOTE — Progress Notes (Signed)
Pt needs to discuss IOL due to childcare.  Pt states she is still having irregular ctx - taking Rx.

## 2022-09-12 NOTE — Addendum Note (Signed)
Addended by: Barkley Boards on: 09/12/2022 10:09 AM   Modules accepted: Orders

## 2022-09-13 LAB — CERVICOVAGINAL ANCILLARY ONLY
Chlamydia: NEGATIVE
Comment: NEGATIVE
Comment: NORMAL
Neisseria Gonorrhea: NEGATIVE

## 2022-09-16 LAB — CULTURE, BETA STREP (GROUP B ONLY): Strep Gp B Culture: NEGATIVE

## 2022-09-18 ENCOUNTER — Ambulatory Visit: Payer: Medicaid Other | Attending: Obstetrics and Gynecology

## 2022-09-18 ENCOUNTER — Ambulatory Visit: Payer: Medicaid Other | Admitting: *Deleted

## 2022-09-18 VITALS — BP 111/76 | HR 89

## 2022-09-18 DIAGNOSIS — O09893 Supervision of other high risk pregnancies, third trimester: Secondary | ICD-10-CM

## 2022-09-18 DIAGNOSIS — O09293 Supervision of pregnancy with other poor reproductive or obstetric history, third trimester: Secondary | ICD-10-CM

## 2022-09-18 DIAGNOSIS — O99891 Other specified diseases and conditions complicating pregnancy: Secondary | ICD-10-CM | POA: Diagnosis not present

## 2022-09-18 DIAGNOSIS — Z3A36 36 weeks gestation of pregnancy: Secondary | ICD-10-CM | POA: Diagnosis not present

## 2022-09-18 DIAGNOSIS — Z3480 Encounter for supervision of other normal pregnancy, unspecified trimester: Secondary | ICD-10-CM | POA: Insufficient documentation

## 2022-09-18 DIAGNOSIS — O09523 Supervision of elderly multigravida, third trimester: Secondary | ICD-10-CM

## 2022-09-18 DIAGNOSIS — O34219 Maternal care for unspecified type scar from previous cesarean delivery: Secondary | ICD-10-CM | POA: Diagnosis not present

## 2022-09-18 DIAGNOSIS — Z362 Encounter for other antenatal screening follow-up: Secondary | ICD-10-CM | POA: Diagnosis not present

## 2022-09-18 DIAGNOSIS — Z148 Genetic carrier of other disease: Secondary | ICD-10-CM | POA: Diagnosis not present

## 2022-09-18 DIAGNOSIS — Z98891 History of uterine scar from previous surgery: Secondary | ICD-10-CM

## 2022-09-19 ENCOUNTER — Ambulatory Visit (INDEPENDENT_AMBULATORY_CARE_PROVIDER_SITE_OTHER): Payer: Medicaid Other | Admitting: Obstetrics and Gynecology

## 2022-09-19 ENCOUNTER — Encounter: Payer: Self-pay | Admitting: Obstetrics and Gynecology

## 2022-09-19 VITALS — BP 117/73 | HR 94 | Wt 198.0 lb

## 2022-09-19 DIAGNOSIS — Z3480 Encounter for supervision of other normal pregnancy, unspecified trimester: Secondary | ICD-10-CM

## 2022-09-19 DIAGNOSIS — Z8619 Personal history of other infectious and parasitic diseases: Secondary | ICD-10-CM

## 2022-09-19 DIAGNOSIS — O09522 Supervision of elderly multigravida, second trimester: Secondary | ICD-10-CM

## 2022-09-19 DIAGNOSIS — Z3A37 37 weeks gestation of pregnancy: Secondary | ICD-10-CM

## 2022-09-19 DIAGNOSIS — O09299 Supervision of pregnancy with other poor reproductive or obstetric history, unspecified trimester: Secondary | ICD-10-CM

## 2022-09-19 DIAGNOSIS — O09523 Supervision of elderly multigravida, third trimester: Secondary | ICD-10-CM

## 2022-09-19 DIAGNOSIS — Z98891 History of uterine scar from previous surgery: Secondary | ICD-10-CM

## 2022-09-19 DIAGNOSIS — O09293 Supervision of pregnancy with other poor reproductive or obstetric history, third trimester: Secondary | ICD-10-CM

## 2022-09-19 NOTE — Progress Notes (Signed)
PRENATAL VISIT NOTE  Subjective:  Melanie Strong is a 37 y.o. G3P1011 at [redacted]w[redacted]d being seen today for ongoing prenatal care.  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  Patient reports no complaints.  Contractions: Irregular. Vag. Bleeding: None.  Movement: Present. Denies leaking of fluid.   The following portions of the patient's history were reviewed and updated as appropriate: allergies, current medications, past family history, past medical history, past social history, past surgical history and problem list.   Objective:   Vitals:   09/19/22 0830  BP: 117/73  Pulse: 94  Weight: 198 lb (89.8 kg)    Fetal Status: Fetal Heart Rate (bpm): 145   Movement: Present     General:  Alert, oriented and cooperative. Patient is in no acute distress.  Skin: Skin is warm and dry. No rash noted.   Cardiovascular: Normal heart rate noted  Respiratory: Normal respiratory effort, no problems with respiration noted  Abdomen: Soft, gravid, appropriate for gestational age.  Pain/Pressure: Present     Pelvic: Cervical exam deferred        Extremities: Normal range of motion.     Mental Status: Normal mood and affect. Normal behavior. Normal judgment and thought content.   [place] [facility] OB FOLLOW UP  Result Date: 09/18/2022 ----------------------------------------------------------------------  OBSTETRICS REPORT                       (Signed Final 09/18/2022 04:28 pm) ---------------------------------------------------------------------- Patient Info  ID #:        [id number]                          D.O.B.:  06-May-1986 (36 yrs)  Name:       Melanie Strong                Visit Date: 09/18/2022 03:44 pm ---------------------------------------------------------------------- Performed By  Attending:        Noralee Space MD        Ref. Address:     [facility]                                                             [facility]                                                             [facility]  Performed By:     Marcellina Millin       Location:         [facility]                                                             [place] [facility]                                                             [facility]  Referred By:      Griffin Basil MD ---------------------------------------------------------------------- Orders  #  Description                           Code        Ordered By  1  [place] MFM OB FOLLOW UP                   [phone]    [place] ----------------------------------------------------------------------  #  Order #                     Accession #                Episode #  1  [id number]                   [id number]                 [id number] ---------------------------------------------------------------------- Indications  Previous cesarean delivery, antepartum         O55.219  Advanced maternal age multigravida 49+,        O58.523  third trimester (36y.o)  Short interval between pregancies, 51rd         O09.893  trimester  Poor obstetric history: Previous preeclampsia  O09.299  Genetic carrier (silent alpha thal, increased  Z14.8  risk for SMA, carrier for Beta-hgb/Beta thal))  Encounter for other antenatal screening        Z36.2  follow-up  [redacted] weeks gestation of pregnancy                Z3A.36  Low risk NIPS, neg AFP ---------------------------------------------------------------------- Vital Signs  BP:          111/76  ---------------------------------------------------------------------- Fetal Evaluation  Num Of Fetuses:         1  Fetal Heart Rate(bpm):  135  Cardiac Activity:       Observed  Presentation:           Cephalic  Placenta:               Posterior  P. Cord Insertion:      Previously visualized  Amniotic Fluid  AFI FV:      Within normal limits  AFI Sum(cm)     %Tile       Largest Pocket(cm)  15.65           59          5.97  RUQ(cm)       RLQ(cm)       LUQ(cm)  LLQ(cm)  5.97          2.63          4.38           2.67 ---------------------------------------------------------------------- Biometry  BPD:      91.1  mm     G. Age:  37w 0d         67  %    CI:        77.32   %    70 - 86                                                          FL/HC:      21.3   %    20.8 - 22.6  HC:       328   mm     G. Age:  37w 2d         32  %    HC/AC:      0.94        0.92 - 1.05  AC:      347.3  mm     G. Age:  38w 4d         95  %    FL/BPD:     76.6   %    71 - 87  FL:       69.8  mm     G. Age:  35w 6d         22  %    FL/AC:      20.1   %    20 - 24  Est. FW:    3265  gm      7 lb 3 oz     76  % ---------------------------------------------------------------------- OB History  Gravidity:    3         Term:   1        Prem:   0        SAB:   1  TOP:          0       Ectopic:  0        Living: 1 ---------------------------------------------------------------------- Gestational Age  LMP:           36w 6d        Date:  01/03/22                   EDD:   10/10/22  U/S Today:     37w 1d                                        EDD:   10/08/22  Best:          36w 6d     Det. By:  LMP  (01/03/22)          EDD:   10/10/22 ---------------------------------------------------------------------- Targeted Anatomy  Central Nervous System  Calvarium/Cranial V.:  Appears normal         Choroid Plexus:         Seen on prior scan  Intracranial Anat:     Appears normal         Cereb./Vermis:  Seen on prior scan  Cavum:                  Previously             Cisterna Magna:         Seen on prior scan                         visualized  Lateral Ventricles:    Seen on prior scan     Midline Falx:           Appears normal  Spine  Cervical:              Seen on prior scan     Lumbar:                 Seen on prior scan  Thoracic:              Seen on prior scan     Sacral:                 Seen on prior scan  Head/Neck  Face:                  Seen on prior scan     Palate:                 Seen on prior scan  Lips:                  Seen on prior scan     Profile:                Seen on prior scan  Neck:                  Seen on prior scan     Orbits/Eyes:            Seen on prior scan  Nuchal Fold:           Seen on prior scan     Mandible:               Seen on prior scan  Nasal Bone:            Seen on prior scan     Maxilla:                Seen on prior scan  Thorax  Thoracic Contour:      Appears normal         Ductal Arch:            Seen on prior scan  Lungs:                 Appears normal         SVC:                    Seen on prior scan  4 Chamber View:        Seen on prior scan     Cardiac Axis:           Seen on prior scan  Cardiac Activity:      Observed               Diaphragm:              Appears normal  Cardiac Rhythm:        Normal  3 Vessel View:          Seen on prior scan  Rt Outflow Tract:      Seen on prior scan     3 V Trachea View:       Seen on prior scan  Lt Outflow Tract:      Seen on prior scan     IVC:                    Seen on prior scan  Aortic Arch:           Seen on prior scan  Abdomen  Ventral Wall:          Seen on prior scan     Rt Kidney:              Appears normal  Cord Insertion:        Seen on prior scan     Bladder:                Appears normal  Stomach:               Appears normal         Bowel:                  Appears normal  Liver:                 Seen on prior scan     Spleen:                 Seen on prior scan  Lt Kidney:             Appears normal  Extremities  Lt Humerus:            Seen  on prior scan     Lt Femur:               Seen on prior scan  Rt Humerus:            Seen on prior scan     Rt Femur:               Seen on prior scan  Lt Forearm:            Seen on prior scan     Lt Lower Leg:           Seen on prior scan  Rt Forearm:            Seen on prior scan     Rt Lower Leg:           Seen on prior scan  Lt Hand:               Seen on prior scan     Lt Foot:                Seen on prior scan  Rt Hand:               Seen on prior scan     Rt Foot:                Seen on prior scan  Other  Umbilical Cord:        Seen on prior scan     Genitalia:              Female  Masses:                None  visualized  Comment:     No anomalies visualized. ---------------------------------------------------------------------- Impression  Fetal growth is appropriate for gestational age . Abdominal  circumference measurement is at the 95th percentile.  Amniotic fluid is normal and good fetal activity is seen.  Cephalic presenation.  Ultrasound has limitations in accurately-estimating fetal  weights especially in advanced gestation .   Patient does not have gestational diabetes . ---------------------------------------------------------------------- Recommendations  -No follow-up appointments were made . ----------------------------------------------------------------------                 Noralee Space, MD Electronically Signed Final Report   09/18/2022 04:28 pm ----------------------------------------------------------------------   Assessment and Plan:  Pregnancy: G3P1011 at [redacted]w[redacted]d 1. Supervision of other normal pregnancy, antepartum Patient is doing well without complaints  2. History of cesarean section Patient desires TOLAC Scheduled for IOL on 2/2  3. History of ELISA positive for HSV Vatrex previously prescribed  4. Multigravida of advanced maternal age in second trimester   5. Hx of preeclampsia, prior pregnancy, currently pregnant Normotensive and without symptoms  Preterm labor  symptoms and general obstetric precautions including but not limited to vaginal bleeding, contractions, leaking of fluid and fetal movement were reviewed in detail with the patient. Please refer to After Visit Summary for other counseling recommendations.   No follow-ups on file.  Future Appointments  Date Time Provider Department Center  09/26/2022  8:35 AM Matilyn Fehrman, Gigi Gin, MD [facility] None  10/03/2022  8:35 AM Warden Fillers, MD [facility] None  10/04/2022  6:45 AM [facility] SCHED ROOM [facility] None    Catalina Antigua, MD

## 2022-09-19 NOTE — Progress Notes (Signed)
Pt states she was having frequent ctx yesterday.

## 2022-09-20 ENCOUNTER — Other Ambulatory Visit: Payer: Self-pay | Admitting: *Deleted

## 2022-09-20 DIAGNOSIS — Z8619 Personal history of other infectious and parasitic diseases: Secondary | ICD-10-CM

## 2022-09-20 MED ORDER — VALACYCLOVIR HCL 500 MG PO TABS: 500.0000 mg | ORAL_TABLET | Freq: Two times a day (BID) | ORAL | 6 refills | Status: DC

## 2022-09-20 NOTE — Progress Notes (Signed)
Pt needs Valtrex for HSV prophylaxis 3rd trimester pregnancy. RX sent per Dr. Domenic Schwab note and per Dr. Jodi Mourning.

## 2022-09-26 ENCOUNTER — Encounter (HOSPITAL_COMMUNITY): Payer: Self-pay | Admitting: Obstetrics & Gynecology

## 2022-09-26 ENCOUNTER — Inpatient Hospital Stay (HOSPITAL_COMMUNITY)
Admission: AD | Admit: 2022-09-26 | Discharge: 2022-09-26 | Disposition: A | Discharge status: Home / Self Care | Payer: Medicaid Other | Attending: Obstetrics & Gynecology | Admitting: Obstetrics & Gynecology

## 2022-09-26 ENCOUNTER — Ambulatory Visit (INDEPENDENT_AMBULATORY_CARE_PROVIDER_SITE_OTHER): Payer: Medicaid Other | Admitting: Obstetrics and Gynecology

## 2022-09-26 ENCOUNTER — Other Ambulatory Visit: Payer: Self-pay

## 2022-09-26 ENCOUNTER — Encounter: Payer: Self-pay | Admitting: Obstetrics and Gynecology

## 2022-09-26 VITALS — BP 143/76 | HR 99 | Wt 199.0 lb

## 2022-09-26 DIAGNOSIS — Z8619 Personal history of other infectious and parasitic diseases: Secondary | ICD-10-CM

## 2022-09-26 DIAGNOSIS — Z3480 Encounter for supervision of other normal pregnancy, unspecified trimester: Secondary | ICD-10-CM

## 2022-09-26 DIAGNOSIS — O99013 Anemia complicating pregnancy, third trimester: Secondary | ICD-10-CM

## 2022-09-26 DIAGNOSIS — Z3A38 38 weeks gestation of pregnancy: Secondary | ICD-10-CM

## 2022-09-26 DIAGNOSIS — F17210 Nicotine dependence, cigarettes, uncomplicated: Secondary | ICD-10-CM | POA: Diagnosis not present

## 2022-09-26 DIAGNOSIS — O99333 Smoking (tobacco) complicating pregnancy, third trimester: Secondary | ICD-10-CM | POA: Insufficient documentation

## 2022-09-26 DIAGNOSIS — O99011 Anemia complicating pregnancy, first trimester: Secondary | ICD-10-CM

## 2022-09-26 DIAGNOSIS — O26893 Other specified pregnancy related conditions, third trimester: Secondary | ICD-10-CM | POA: Diagnosis not present

## 2022-09-26 DIAGNOSIS — R030 Elevated blood-pressure reading, without diagnosis of hypertension: Secondary | ICD-10-CM | POA: Insufficient documentation

## 2022-09-26 DIAGNOSIS — O09293 Supervision of pregnancy with other poor reproductive or obstetric history, third trimester: Secondary | ICD-10-CM

## 2022-09-26 DIAGNOSIS — O163 Unspecified maternal hypertension, third trimester: Secondary | ICD-10-CM

## 2022-09-26 DIAGNOSIS — O169 Unspecified maternal hypertension, unspecified trimester: Secondary | ICD-10-CM

## 2022-09-26 DIAGNOSIS — O34211 Maternal care for low transverse scar from previous cesarean delivery: Secondary | ICD-10-CM

## 2022-09-26 DIAGNOSIS — O09523 Supervision of elderly multigravida, third trimester: Secondary | ICD-10-CM

## 2022-09-26 DIAGNOSIS — Z87440 Personal history of urinary (tract) infections: Secondary | ICD-10-CM | POA: Diagnosis not present

## 2022-09-26 DIAGNOSIS — O09522 Supervision of elderly multigravida, second trimester: Secondary | ICD-10-CM

## 2022-09-26 DIAGNOSIS — O09299 Supervision of pregnancy with other poor reproductive or obstetric history, unspecified trimester: Secondary | ICD-10-CM

## 2022-09-26 DIAGNOSIS — Z98891 History of uterine scar from previous surgery: Secondary | ICD-10-CM

## 2022-09-26 LAB — URINALYSIS, ROUTINE W REFLEX MICROSCOPIC
Bilirubin Urine: NEGATIVE
Glucose, UA: NEGATIVE mg/dL
Hgb urine dipstick: NEGATIVE
Ketones, ur: NEGATIVE mg/dL
Nitrite: NEGATIVE
Protein, ur: NEGATIVE mg/dL
Specific Gravity, Urine: 1.009 (ref 1.005–1.030)
pH: 7 (ref 5.0–8.0)

## 2022-09-26 LAB — CBC
HCT: 34.5 % — ABNORMAL LOW (ref 36.0–46.0)
Hemoglobin: 11.7 g/dL — ABNORMAL LOW (ref 12.0–15.0)
MCH: 28.1 pg (ref 26.0–34.0)
MCHC: 33.9 g/dL (ref 30.0–36.0)
MCV: 82.9 fL (ref 80.0–100.0)
Platelets: 195 10*3/uL (ref 150–400)
RBC: 4.16 MIL/uL (ref 3.87–5.11)
RDW: 15.2 % (ref 11.5–15.5)
WBC: 8.2 10*3/uL (ref 4.0–10.5)
nRBC: 0 % (ref 0.0–0.2)

## 2022-09-26 LAB — COMPREHENSIVE METABOLIC PANEL
ALT: 12 U/L (ref 0–44)
AST: 16 U/L (ref 15–41)
Albumin: 3.2 g/dL — ABNORMAL LOW (ref 3.5–5.0)
Alkaline Phosphatase: 145 U/L — ABNORMAL HIGH (ref 38–126)
Anion gap: 12 (ref 5–15)
BUN: <5 mg/dL — ABNORMAL LOW (ref 6–20)
CO2: 19 mmol/L — ABNORMAL LOW (ref 22–32)
Calcium: 9.3 mg/dL (ref 8.9–10.3)
Chloride: 105 mmol/L (ref 98–111)
Creatinine, Ser: 0.58 mg/dL (ref 0.44–1.00)
GFR, Estimated: >60 mL/min (ref >60)
Glucose, Bld: 89 mg/dL (ref 70–99)
Potassium: 3.7 mmol/L (ref 3.5–5.1)
Sodium: 136 mmol/L (ref 135–145)
Total Bilirubin: 0.8 mg/dL (ref 0.3–1.2)
Total Protein: 6.7 g/dL (ref 6.5–8.1)

## 2022-09-26 LAB — PROTEIN / CREATININE RATIO, URINE
Creatinine, Urine: 49 mg/dL
Protein Creatinine Ratio: 0.16 mg/mg{Cre} — ABNORMAL HIGH (ref 0.00–0.15)
Total Protein, Urine: 8 mg/dL

## 2022-09-26 MED ORDER — ACETAMINOPHEN-CAFFEINE 500-65 MG PO TABS
2.0000 | ORAL_TABLET | Freq: Once | ORAL | Status: AC
  Administered 2022-09-26: 2 via ORAL
  Filled 2022-09-26: qty 2

## 2022-09-26 MED ORDER — METOCLOPRAMIDE HCL 10 MG PO TABS
10.0000 mg | ORAL_TABLET | Freq: Once | ORAL | Status: AC
  Administered 2022-09-26: 10 mg via ORAL
  Filled 2022-09-26: qty 1

## 2022-09-26 NOTE — Progress Notes (Signed)
   PRENATAL VISIT NOTE  Subjective:  Melanie Strong is a 37 y.o. G3P1011 at [redacted]w[redacted]d being seen today for ongoing prenatal care.  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  Patient reports headache.  Contractions: Irregular. Vag. Bleeding: None.  Movement: Present. Denies leaking of fluid.   The following portions of the patient's history were reviewed and updated as appropriate: allergies, current medications, past family history, past medical history, past social history, past surgical history and problem list.   Objective:   Vitals:   09/26/22 0831 09/26/22 0853  BP: (!) 140/77 (!) 143/76  Pulse: 90 99  Weight: 199 lb (90.3 kg)     Fetal Status: Fetal Heart Rate (bpm): 140 Fundal Height: 38 cm Movement: Present     General:  Alert, oriented and cooperative. Patient is in no acute distress.  Skin: Skin is warm and dry. No rash noted.   Cardiovascular: Normal heart rate noted  Respiratory: Normal respiratory effort, no problems with respiration noted  Abdomen: Soft, gravid, appropriate for gestational age.  Pain/Pressure: Present     Pelvic: Cervical exam performed in the presence of a chaperone Dilation: 1.5 Effacement (%): 40 Station: -3  Extremities: Normal range of motion.     Mental Status: Normal mood and affect. Normal behavior. Normal judgment and thought content.   Assessment and Plan:  Pregnancy: G3P1011 at [redacted]w[redacted]d 1. Supervision of other normal pregnancy, antepartum Patient is doing well without complaints  2. History of cesarean  section Desires TOLAC  3. History of ELISA positive for HSV Continue Valtrex  4. Multigravida of advanced maternal age in second trimester   5. Hx of preeclampsia, prior pregnancy, currently pregnant Elevated BP today with HA.  Patient sent to [facility] for evaluation Currently scheduled for IOL on 2/2   Term labor symptoms and general obstetric precautions including but not limited to vaginal bleeding, contractions, leaking of fluid and fetal movement were reviewed in detail with the patient. Please refer to After Visit Summary for other counseling recommendations.   Return in about 1 week (around 10/03/2022) for in person, ROB, High risk.  Future Appointments  Date Time Provider [place]  10/03/2022  8:35 AM Griffin Basil, MD [facility] None  10/04/2022  6:45 AM [facility] SCHED ROOM [facility] None    Mora Bellman, MD

## 2022-09-26 NOTE — MAU Provider Note (Signed)
History     CSN: [id number]  Arrival date and time: 09/26/22 7846   Event Date/Time   First Provider Initiated Contact with Patient 09/26/22 [phone]      Chief Complaint  Patient presents with   Headache   Hypertension   HPI  Melanie Strong is a 37 y.o. G3P1011 at [redacted]w[redacted]d who presents for evaluation of new onset elevated blood pressures. Patient reports she went to her normal OB visit today and was found to have elevated blood pressures. She reports she has a dull headache. Patient rates the pain as a 3/10 and has not tried anything for the pain. She reports she has felt dizzy for the last 2 days, similar to the way she felt when she had preeclampsia with her last pregnancy. She denies any RUQ pain or changes in her vision. She denies any vaginal bleeding, discharge, and leaking of fluid. Denies any constipation, diarrhea or any urinary complaints. Reports normal fetal movement.   OB History     Gravida  3   Para  1   Term  1   Preterm      AB  1   Living  1      SAB  1   IAB      Ectopic      Multiple  0   Live Births  1           Past Medical History:  Diagnosis Date   Anemia    Genital herpes 07/11/2021   Glucose found in urine on examination    Infection    UTI    Past Surgical History:  Procedure Laterality Date   CESAREAN SECTION  08/23/2021   Procedure: CESAREAN SECTION;  Surgeon: Caren Macadam, MD;  Location: [facility];  Service: Obstetrics;;   NO PAST SURGERIES      Family History  Problem Relation Age of Onset   Hypertension Mother    Diabetes Mother        Boarderline   Lupus Mother    Cancer Father        rectal   Sickle cell anemia Sister    Healthy Brother    Healthy Brother    Cancer Paternal Aunt        brain   Diabetes Maternal Grandmother    Other Neg Hx    Asthma Neg Hx    Heart disease Neg Hx    Stroke Neg Hx     Social History   Tobacco Use   Smoking status: Every Day    Packs/day: 0.25    Years:  10.00    Total pack years: 2.50    Types: Cigarettes   Smokeless tobacco: Never  Vaping Use   Vaping Use: Never used  Substance Use Topics   Alcohol use: No    Comment: rare   Drug use: No    Allergies: No Known Allergies  No medications prior to admission.    Review of Systems  Constitutional: Negative.  Negative for fatigue and fever.  HENT: Negative.    Respiratory: Negative.  Negative for shortness of breath.   Cardiovascular: Negative.  Negative for chest pain.  Gastrointestinal: Negative.  Negative for abdominal pain, constipation, diarrhea, nausea and vomiting.  Genitourinary: Negative.  Negative for dysuria, vaginal bleeding and vaginal discharge.  Neurological:  Positive for headaches. Negative for dizziness.   Physical Exam   Blood pressure 117/61, pulse 75, temperature 99.4 F (37.4 C), temperature source Oral, resp. rate  18, height 5\' 5"  (1.651 m), weight 90 kg, last menstrual period 01/03/2022, SpO2 99 %, currently breastfeeding.  Patient Vitals for the past 24 hrs:  BP Temp Temp src Pulse Resp SpO2 Height Weight  09/26/22 1202 117/61 -- -- 75 -- -- -- --  09/26/22 1146 119/69 -- -- 81 -- 99 % -- --  09/26/22 1131 116/70 -- -- 85 -- 100 % -- --  09/26/22 1116 121/65 -- -- 87 -- 100 % -- --  09/26/22 1100 (!) 119/56 -- -- 74 -- 100 % -- --  09/26/22 1046 (!) 119/94 -- -- 90 -- 100 % -- --  09/26/22 1031 118/70 -- -- 87 -- 100 % -- --  09/26/22 1016 120/70 -- -- 87 -- 99 % -- --  09/26/22 1001 121/64 -- -- 92 -- 100 % -- --  09/26/22 0949 128/67 -- -- 86 -- -- -- --  09/26/22 0932 (!) 140/77 99.4 F (37.4 C) Oral 99 18 99 % -- --  09/26/22 0926 -- -- -- -- -- -- 5\' 5"  (1.651 m) 90 kg    Physical Exam Vitals and nursing note reviewed.  Constitutional:      General: She is not in acute distress.    Appearance: She is well-developed.  HENT:     Head: Normocephalic.  Eyes:     Pupils: Pupils are equal, round, and reactive to light.  Cardiovascular:      Rate and Rhythm: Normal rate and regular rhythm.     Heart sounds: Normal heart sounds.  Pulmonary:     Effort: Pulmonary effort is normal. No respiratory distress.     Breath sounds: Normal breath sounds.  Abdominal:     General: Bowel sounds are normal. There is no distension.     Palpations: Abdomen is soft.     Tenderness: There is no abdominal tenderness.  Skin:    General: Skin is warm and dry.  Neurological:     Mental Status: She is alert and oriented to person, place, and time.     Motor: No abnormal muscle tone.     Coordination: Coordination normal.     Deep Tendon Reflexes: Reflexes are normal and symmetric. Reflexes normal.  Psychiatric:        Behavior: Behavior normal.        Thought Content: Thought content normal.        Judgment: Judgment normal.     Fetal Tracing:  Baseline: 130 Variability: moderate Accels: 15x15 Decels: none  Toco: occasional uc's  MAU Course  Procedures  Results for orders placed or performed during the hospital encounter of 09/26/22 (from the past 24 hour(s))  Urinalysis, Routine w reflex microscopic -Urine, Clean Catch     Status: Abnormal   Collection Time: 09/26/22  9:44 AM  Result Value Ref Range   Color, Urine YELLOW YELLOW   APPearance HAZY (A) CLEAR   Specific Gravity, Urine 1.009 1.005 - 1.030   pH 7.0 5.0 - 8.0   Glucose, UA NEGATIVE NEGATIVE mg/dL   Hgb urine dipstick NEGATIVE NEGATIVE   Bilirubin Urine NEGATIVE NEGATIVE   Ketones, ur NEGATIVE NEGATIVE mg/dL   Protein, ur NEGATIVE NEGATIVE mg/dL   Nitrite NEGATIVE NEGATIVE   Leukocytes,Ua TRACE (A) NEGATIVE   RBC / HPF 0-5 0 - 5 RBC/hpf   WBC, UA 0-5 0 - 5 WBC/hpf   Bacteria, UA MANY (A) NONE SEEN   Squamous Epithelial / HPF 0-5 0 - 5 /HPF   Mucus PRESENT  Protein / creatinine ratio, urine     Status: Abnormal   Collection Time: 09/26/22  9:44 AM  Result Value Ref Range   Creatinine, Urine 49 mg/dL   Total Protein, Urine 8 mg/dL   Protein Creatinine Ratio  0.16 (H) 0.00 - 0.15 mg/mg[Cre]  CBC     Status: Abnormal   Collection Time: 09/26/22 10:14 AM  Result Value Ref Range   WBC 8.2 4.0 - 10.5 K/uL   RBC 4.16 3.87 - 5.11 MIL/uL   Hemoglobin 11.7 (L) 12.0 - 15.0 g/dL   HCT 34.5 (L) 36.0 - 46.0 %   MCV 82.9 80.0 - 100.0 fL   MCH 28.1 26.0 - 34.0 pg   MCHC 33.9 30.0 - 36.0 g/dL   RDW 15.2 11.5 - 15.5 %   Platelets 195 150 - 400 K/uL   nRBC 0.0 0.0 - 0.2 %  Comprehensive metabolic panel     Status: Abnormal   Collection Time: 09/26/22 10:14 AM  Result Value Ref Range   Sodium 136 135 - 145 mmol/L   Potassium 3.7 3.5 - 5.1 mmol/L   Chloride 105 98 - 111 mmol/L   CO2 19 (L) 22 - 32 mmol/L   Glucose, Bld 89 70 - 99 mg/dL   BUN <5 (L) 6 - 20 mg/dL   Creatinine, Ser 0.58 0.44 - 1.00 mg/dL   Calcium 9.3 8.9 - 10.3 mg/dL   Total Protein 6.7 6.5 - 8.1 g/dL   Albumin 3.2 (L) 3.5 - 5.0 g/dL   AST 16 15 - 41 U/L   ALT 12 0 - 44 U/L   Alkaline Phosphatase 145 (H) 38 - 126 U/L   Total Bilirubin 0.8 0.3 - 1.2 mg/dL   GFR, Estimated >60 >60 mL/min   Anion gap 12 5 - 15    MDM Labs ordered and reviewed.   UA CBC, CMP, Protein/creat ratio Excedrin Tension and Reglan PO  CNM consulted with Dr. Nelda Marseille regarding presentation and results- MD recommends BP check in MAU tomorrow and if remains elevated, recommends IOL for gHTN but does not meet criteria at this time  Assessment and Plan   1. Elevated blood pressure affecting pregnancy, antepartum   2. [redacted] weeks gestation of pregnancy     -Discharge home in stable condition -Preeclampsia precautions discussed -Patient advised to follow-up with MAU tomorrow for BP check -Patient may return to MAU as needed or if her condition were to change or worsen  Wende Mott, CNM 09/26/2022, 9:59 AM

## 2022-09-26 NOTE — MAU Note (Signed)
Melanie Strong is a 37 y.o. at [redacted]w[redacted]d here in MAU reporting: was sent over from the office for BP eval. Had RUQ pain yesterday but none currently. Has a headache. No visual changes. Intermittent contractions. No bleeding or LOF. +FM  Onset of complaint: today  Pain score: 3/10  Vitals:   09/26/22 0932  BP: (!) 140/77  Pulse: 99  Resp: 18  Temp: 99.4 F (37.4 C)  SpO2: 99%     FHT:142  Lab orders placed from triage: UA

## 2022-09-26 NOTE — Discharge Instructions (Signed)

## 2022-09-27 ENCOUNTER — Other Ambulatory Visit (HOSPITAL_COMMUNITY): Payer: Medicaid Other | Attending: Obstetrics & Gynecology

## 2022-09-27 ENCOUNTER — Telehealth (HOSPITAL_COMMUNITY): Payer: Self-pay | Admitting: *Deleted

## 2022-09-27 ENCOUNTER — Other Ambulatory Visit: Payer: Self-pay | Admitting: Obstetrics

## 2022-09-27 ENCOUNTER — Inpatient Hospital Stay (HOSPITAL_COMMUNITY)
Admission: AD | Admit: 2022-09-27 | Discharge: 2022-09-27 | Disposition: A | Discharge status: Home / Self Care | Payer: Medicaid Other | Attending: Obstetrics & Gynecology | Admitting: Obstetrics & Gynecology

## 2022-09-27 ENCOUNTER — Other Ambulatory Visit (HOSPITAL_COMMUNITY): Payer: Self-pay | Admitting: Advanced Practice Midwife

## 2022-09-27 DIAGNOSIS — Z3A38 38 weeks gestation of pregnancy: Secondary | ICD-10-CM

## 2022-09-27 DIAGNOSIS — R42 Dizziness and giddiness: Secondary | ICD-10-CM

## 2022-09-27 DIAGNOSIS — O26893 Other specified pregnancy related conditions, third trimester: Secondary | ICD-10-CM | POA: Diagnosis not present

## 2022-09-27 NOTE — MAU Provider Note (Signed)
History     CSN: [id number]  Arrival date and time: 09/27/22 3710   Event Date/Time   First Provider Initiated Contact with Patient 09/27/22 1036      Chief Complaint  Patient presents with   Headache   Dizziness   Melanie Strong is a 37 y.o. year old G45P1011 female at [redacted]w[redacted]d weeks gestation who presents to MAU reporting she needs her BP checked, because she has been feeling dizzy "all week." She reports she "just about fell out in the shower last night." She reports a mild H/A (rated 3-4/10), but has not taken any medications for it since receiving Excedrin Tension Headache tablets while in MAU yesterday. She states, "I don't like taking Tylenol or any medications in pregnancy. The H/A is not that bad." She states she occasionally "see spots or floaters" in her vision. She denies epigastric pain, VB or LOF. She was sent to MAU yesterday after her OB appt d/t elevated BPs in the office. Her PEC w/u was normal with one other elevated BP. She was told to come here today for a BP recheck. She receives [facility] with [facility]; next appt is 10/03/2022. She is scheduled for IOL on 10/04/2022. She reports drinking more water as advised by a prior MAU provider; now drinks "about 3 cups of water every day."    OB History     Gravida  3   Para  1   Term  1   Preterm      AB  1   Living  1      SAB  1   IAB      Ectopic      Multiple  0   Live Births  1           Past Medical History:  Diagnosis Date   Anemia    Genital herpes 07/11/2021   Glucose found in urine on examination    Infection    UTI    Past Surgical History:  Procedure Laterality Date   CESAREAN SECTION  08/23/2021   Procedure: CESAREAN SECTION;  Surgeon: Caren Macadam, MD;  Location: [facility];  Service: Obstetrics;;   NO PAST SURGERIES      Family History  Problem Relation Age of Onset   Hypertension Mother    Diabetes Mother        Boarderline   Lupus Mother    Cancer Father         rectal   Sickle cell anemia Sister    Healthy Brother    Healthy Brother    Cancer Paternal Aunt        brain   Diabetes Maternal Grandmother    Other Neg Hx    Asthma Neg Hx    Heart disease Neg Hx    Stroke Neg Hx     Social History   Tobacco Use   Smoking status: Every Day    Packs/day: 0.25    Years: 10.00    Total pack years: 2.50    Types: Cigarettes   Smokeless tobacco: Never  Vaping Use   Vaping Use: Never used  Substance Use Topics   Alcohol use: No    Comment: rare   Drug use: No    Allergies: No Known Allergies  Medications Prior to Admission  Medication Sig Dispense Refill Last Dose   aspirin EC 81 MG tablet Take 1 tablet (81 mg total) by mouth daily. Swallow whole. 30 tablet 12 09/27/2022  Prenatal Vit-Fe Fumarate-FA (MULTIVITAMIN-PRENATAL) 27-0.8 MG TABS tablet TAKE 1 TABLET BY MOUTH DAILY BEFORE BREAKFAST 30 tablet 11 09/27/2022   valACYclovir (VALTREX) 500 MG tablet Take 1 tablet (500 mg total) by mouth 2 (two) times daily. 60 tablet 6 09/27/2022    Review of Systems  Constitutional: Negative.   HENT: Negative.    Eyes: Negative.   Respiratory: Negative.    Cardiovascular: Negative.   Gastrointestinal: Negative.   Endocrine: Negative.   Genitourinary: Negative.        (+) FM  Musculoskeletal: Negative.   Skin: Negative.   Allergic/Immunologic: Negative.   Neurological:  Positive for dizziness and headaches.  Hematological: Negative.   Psychiatric/Behavioral: Negative.     Physical Exam   Patient Vitals for the past 24 hrs:  BP Temp Temp src Pulse Resp SpO2 Height Weight  09/27/22 1145 116/78 -- -- 90 -- 100 % -- --  09/27/22 1130 118/76 -- -- 90 -- 99 % -- --  09/27/22 1115 117/69 -- -- 86 -- 99 % -- --  09/27/22 1100 104/84 -- -- 89 -- 100 % -- --  09/27/22 1045 122/74 -- -- 86 -- 99 % -- --  09/27/22 1030 121/74 -- -- 99 -- 100 % -- --  09/27/22 1013 131/76 98.4 F (36.9 C) Oral 96 17 99 % 5\' 5"  (1.651 m) 89.7 kg   Physical  Exam Vitals and nursing note reviewed.  Constitutional:      Appearance: Normal appearance. She is normal weight.  Cardiovascular:     Rate and Rhythm: Normal rate.  Pulmonary:     Effort: Pulmonary effort is normal.  Abdominal:     Palpations: Abdomen is soft.  Genitourinary:    Comments: Not indicated Musculoskeletal:        General: Normal range of motion.  Skin:    General: Skin is warm and dry.  Neurological:     Mental Status: She is alert and oriented to person, place, and time.  Psychiatric:        Mood and Affect: Mood normal.        Behavior: Behavior normal.        Thought Content: Thought content normal.        Judgment: Judgment normal.     REACTIVE NST - FHR: 135 bpm / moderate variability / accels present / decels absent / TOCO: UI noted  MAU Course  Procedures  MDM EFM Serial BPs  Assessment and Plan  1. Dizziness of unknown etiology - Advised that dizziness could be the result of dehydration - Advised that 3 cups of water is still not a sufficient amount of water to drink.  - Recommend drinking at least 64 to 128 oz of water everyday  2. [redacted] weeks gestation of pregnancy   - Discharge patient - Keep scheduled appt on [facility] on 10/03/2022 - Patient verbalized an understanding of the plan of care and agrees.    Laury Deep, CNM 09/27/2022, 11:53 AM

## 2022-09-27 NOTE — Telephone Encounter (Signed)
Preadmission screen  

## 2022-09-27 NOTE — MAU Note (Signed)
Melanie Strong is a 37 y.o. at [redacted]w[redacted]d here in MAU reporting:  here for BP check.  feeling dizzy today, about fell out in the shower last night. Little HA,sees spots occasionally, denies epigastric pain, little swelling in hands.  Denies vag bleeding or LOF.  Reports +FM.   Did not take anything for HA Onset of complaint: ongoing Pain score: HA 3 Vitals:   09/27/22 1013  BP: 131/76  Pulse: 96  Resp: 17  Temp: 98.4 F (36.9 C)  SpO2: 99%     FHT:136 Lab orders placed from triage:  urine obtained

## 2022-09-30 ENCOUNTER — Telehealth (HOSPITAL_COMMUNITY): Payer: Self-pay | Admitting: *Deleted

## 2022-09-30 ENCOUNTER — Encounter (HOSPITAL_COMMUNITY): Payer: Self-pay | Admitting: *Deleted

## 2022-09-30 NOTE — Telephone Encounter (Signed)
Preadmission screen  

## 2022-10-02 ENCOUNTER — Other Ambulatory Visit: Payer: Self-pay | Admitting: Obstetrics and Gynecology

## 2022-10-02 ENCOUNTER — Other Ambulatory Visit: Payer: Self-pay | Admitting: Advanced Practice Midwife

## 2022-10-02 DIAGNOSIS — Z3480 Encounter for supervision of other normal pregnancy, unspecified trimester: Secondary | ICD-10-CM

## 2022-10-03 ENCOUNTER — Ambulatory Visit (INDEPENDENT_AMBULATORY_CARE_PROVIDER_SITE_OTHER): Payer: Medicaid Other | Admitting: Obstetrics and Gynecology

## 2022-10-03 VITALS — BP 125/77 | HR 99 | Wt 198.0 lb

## 2022-10-03 DIAGNOSIS — Z3A39 39 weeks gestation of pregnancy: Secondary | ICD-10-CM

## 2022-10-03 DIAGNOSIS — O09523 Supervision of elderly multigravida, third trimester: Secondary | ICD-10-CM

## 2022-10-03 DIAGNOSIS — Z98891 History of uterine scar from previous surgery: Secondary | ICD-10-CM

## 2022-10-03 DIAGNOSIS — O09293 Supervision of pregnancy with other poor reproductive or obstetric history, third trimester: Secondary | ICD-10-CM

## 2022-10-03 DIAGNOSIS — O09299 Supervision of pregnancy with other poor reproductive or obstetric history, unspecified trimester: Secondary | ICD-10-CM

## 2022-10-03 DIAGNOSIS — D563 Thalassemia minor: Secondary | ICD-10-CM

## 2022-10-03 DIAGNOSIS — Z419 Encounter for procedure for purposes other than remedying health state, unspecified: Secondary | ICD-10-CM | POA: Diagnosis not present

## 2022-10-03 DIAGNOSIS — O09522 Supervision of elderly multigravida, second trimester: Secondary | ICD-10-CM

## 2022-10-03 DIAGNOSIS — Z3480 Encounter for supervision of other normal pregnancy, unspecified trimester: Secondary | ICD-10-CM

## 2022-10-03 NOTE — Progress Notes (Signed)
ROB, c/o headache 4/10 x 4 days.

## 2022-10-03 NOTE — Progress Notes (Deleted)
r 

## 2022-10-03 NOTE — Progress Notes (Signed)
   PRENATAL VISIT NOTE  Subjective:  Melanie Strong is a 37 y.o. G3P1011 at [redacted]w[redacted]d being seen today for ongoing prenatal care.  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  Patient doing well with no acute concerns today. She reports  mild headache, she has not taken tylenol.  Currently denies visual changes and RUQ pain. .  Contractions: Irregular. Vag. Bleeding: None.  Movement: Present. Denies leaking of fluid.   The following portions of the patient's history were reviewed and updated as appropriate: allergies, current medications, past family history, past medical history, past social history, past surgical history and problem list. Problem list updated.  Objective:   Vitals:   10/03/22 0851  BP: 125/77  Pulse: 99  Weight: 198 lb (89.8 kg)    Fetal Status: Fetal Heart Rate (bpm): 150 Fundal Height: 40 cm Movement: Present     General:  Alert, oriented and cooperative. Patient is in no acute distress.  Skin: Skin is warm and dry. No rash noted.   Cardiovascular: Normal heart rate noted  Respiratory: Normal respiratory effort, no problems with respiration noted  Abdomen: Soft, gravid, appropriate for gestational age.  Pain/Pressure: Present     Pelvic: Cervical exam deferred        Extremities: Normal range of motion.  Edema: Trace  Mental Status:  Normal mood and affect. Normal behavior. Normal judgment and thought content.   Assessment and Plan:  Pregnancy: G3P1011 at [redacted]w[redacted]d  1. [redacted] weeks gestation of pregnancy   2. Supervision of other  normal pregnancy, antepartum IOL on 2/2 Pt advised if headache worsens or she acquires new symptoms that she should be evaluated at MAU.  She notes understanding.  3. Multigravida of advanced maternal age in second trimester   4. Hx of preeclampsia, prior pregnancy, currently pregnant BP WNL, only headache noted, precautions given  5. History of cesarean section Pt desires TOLAC  6. 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 Alpha thalassemia silent carrier   Term labor symptoms and general obstetric precautions including but not limited to vaginal bleeding, contractions, leaking of fluid and fetal movement were reviewed in detail with the patient.  Please refer to After Visit Summary for other counseling recommendations.      Lynnda Shields, MD Faculty Attending [facility] for [facility]

## 2022-10-04 ENCOUNTER — Encounter (HOSPITAL_COMMUNITY): Payer: Self-pay | Admitting: Obstetrics and Gynecology

## 2022-10-04 ENCOUNTER — Inpatient Hospital Stay (HOSPITAL_COMMUNITY): Payer: Medicaid Other | Admitting: Anesthesiology

## 2022-10-04 ENCOUNTER — Other Ambulatory Visit: Payer: Self-pay

## 2022-10-04 ENCOUNTER — Inpatient Hospital Stay (HOSPITAL_COMMUNITY)
Admission: RE | Admit: 2022-10-04 | Discharge: 2022-10-06 | DRG: 788 | Disposition: A | Discharge status: Home / Self Care | Payer: Medicaid Other | Attending: Obstetrics and Gynecology | Admitting: Obstetrics and Gynecology

## 2022-10-04 ENCOUNTER — Encounter (HOSPITAL_COMMUNITY)
Admission: RE | Disposition: A | Discharge status: Home / Self Care | Payer: Self-pay | Source: Home / Self Care | Attending: Obstetrics and Gynecology

## 2022-10-04 ENCOUNTER — Inpatient Hospital Stay (HOSPITAL_COMMUNITY): Payer: Medicaid Other

## 2022-10-04 DIAGNOSIS — O164 Unspecified maternal hypertension, complicating childbirth: Secondary | ICD-10-CM

## 2022-10-04 DIAGNOSIS — Z3A39 39 weeks gestation of pregnancy: Secondary | ICD-10-CM

## 2022-10-04 DIAGNOSIS — A6000 Herpesviral infection of urogenital system, unspecified: Secondary | ICD-10-CM | POA: Diagnosis present

## 2022-10-04 DIAGNOSIS — F17210 Nicotine dependence, cigarettes, uncomplicated: Secondary | ICD-10-CM | POA: Diagnosis not present

## 2022-10-04 DIAGNOSIS — O34211 Maternal care for low transverse scar from previous cesarean delivery: Principal | ICD-10-CM | POA: Diagnosis present

## 2022-10-04 DIAGNOSIS — O34219 Maternal care for unspecified type scar from previous cesarean delivery: Secondary | ICD-10-CM

## 2022-10-04 DIAGNOSIS — Z3490 Encounter for supervision of normal pregnancy, unspecified, unspecified trimester: Secondary | ICD-10-CM | POA: Diagnosis present

## 2022-10-04 DIAGNOSIS — O99334 Smoking (tobacco) complicating childbirth: Secondary | ICD-10-CM | POA: Diagnosis present

## 2022-10-04 DIAGNOSIS — Z3A00 Weeks of gestation of pregnancy not specified: Secondary | ICD-10-CM | POA: Diagnosis not present

## 2022-10-04 DIAGNOSIS — Z148 Genetic carrier of other disease: Secondary | ICD-10-CM

## 2022-10-04 DIAGNOSIS — Z3480 Encounter for supervision of other normal pregnancy, unspecified trimester: Secondary | ICD-10-CM

## 2022-10-04 DIAGNOSIS — Z8619 Personal history of other infectious and parasitic diseases: Secondary | ICD-10-CM

## 2022-10-04 DIAGNOSIS — O9832 Other infections with a predominantly sexual mode of transmission complicating childbirth: Secondary | ICD-10-CM | POA: Diagnosis present

## 2022-10-04 LAB — CBC
HCT: 34.5 % — ABNORMAL LOW (ref 36.0–46.0)
Hemoglobin: 11.4 g/dL — ABNORMAL LOW (ref 12.0–15.0)
MCH: 27.7 pg (ref 26.0–34.0)
MCHC: 33 g/dL (ref 30.0–36.0)
MCV: 83.9 fL (ref 80.0–100.0)
Platelets: 200 10*3/uL (ref 150–400)
RBC: 4.11 MIL/uL (ref 3.87–5.11)
RDW: 15.4 % (ref 11.5–15.5)
WBC: 8.6 10*3/uL (ref 4.0–10.5)
nRBC: 0 % (ref 0.0–0.2)

## 2022-10-04 LAB — TYPE AND SCREEN
ABO/RH(D): O POS
Antibody Screen: NEGATIVE

## 2022-10-04 LAB — CREATININE, SERUM
Creatinine, Ser: 0.6 mg/dL (ref 0.44–1.00)
GFR, Estimated: >60 mL/min (ref >60)

## 2022-10-04 LAB — RPR: RPR Ser Ql: NONREACTIVE

## 2022-10-04 SURGERY — Surgical Case
Anesthesia: Spinal

## 2022-10-04 MED ORDER — ACETAMINOPHEN 10 MG/ML IV SOLN
INTRAVENOUS | Status: DC | PRN
  Administered 2022-10-04: 1000 mg via INTRAVENOUS

## 2022-10-04 MED ORDER — NALOXONE HCL 0.4 MG/ML IJ SOLN: 0.4000 mg | INTRAMUSCULAR | Status: DC | PRN

## 2022-10-04 MED ORDER — SIMETHICONE 80 MG PO CHEW
80.0000 mg | CHEWABLE_TABLET | Freq: Three times a day (TID) | ORAL | Status: DC
  Administered 2022-10-05 – 2022-10-06 (×4): 80 mg via ORAL
  Filled 2022-10-04 (×4): qty 1

## 2022-10-04 MED ORDER — SIMETHICONE 80 MG PO CHEW: 80.0000 mg | CHEWABLE_TABLET | ORAL | Status: DC | PRN

## 2022-10-04 MED ORDER — ACETAMINOPHEN 10 MG/ML IV SOLN
INTRAVENOUS | Status: AC
  Filled 2022-10-04: qty 100

## 2022-10-04 MED ORDER — ACETAMINOPHEN 325 MG PO TABS
650.0000 mg | ORAL_TABLET | ORAL | Status: DC | PRN
  Administered 2022-10-04 – 2022-10-06 (×4): 650 mg via ORAL
  Filled 2022-10-04 (×4): qty 2

## 2022-10-04 MED ORDER — IBUPROFEN 600 MG PO TABS
600.0000 mg | ORAL_TABLET | Freq: Four times a day (QID) | ORAL | Status: DC
  Administered 2022-10-05 – 2022-10-06 (×3): 600 mg via ORAL
  Filled 2022-10-04 (×3): qty 1

## 2022-10-04 MED ORDER — DEXAMETHASONE SODIUM PHOSPHATE 10 MG/ML IJ SOLN
INTRAMUSCULAR | Status: AC
  Filled 2022-10-04: qty 3

## 2022-10-04 MED ORDER — SODIUM CHLORIDE 0.9% FLUSH: 3.0000 mL | INTRAVENOUS | Status: DC | PRN

## 2022-10-04 MED ORDER — PHENYLEPHRINE HCL-NACL 20-0.9 MG/250ML-% IV SOLN
INTRAVENOUS | Status: AC
  Filled 2022-10-04: qty 250

## 2022-10-04 MED ORDER — FENTANYL CITRATE (PF) 100 MCG/2ML IJ SOLN
INTRAMUSCULAR | Status: AC
  Filled 2022-10-04: qty 2

## 2022-10-04 MED ORDER — LACTATED RINGERS IV SOLN: 500.0000 mL | INTRAVENOUS | Status: DC | PRN

## 2022-10-04 MED ORDER — MENTHOL 3 MG MT LOZG: 1.0000 | LOZENGE | OROMUCOSAL | Status: DC | PRN

## 2022-10-04 MED ORDER — DIPHENHYDRAMINE HCL 25 MG PO CAPS: 25.0000 mg | ORAL_CAPSULE | Freq: Four times a day (QID) | ORAL | Status: DC | PRN

## 2022-10-04 MED ORDER — PROMETHAZINE HCL 25 MG/ML IJ SOLN: 6.2500 mg | INTRAMUSCULAR | Status: DC | PRN

## 2022-10-04 MED ORDER — OXYCODONE HCL 5 MG/5ML PO SOLN: 5.0000 mg | Freq: Once | ORAL | Status: DC | PRN

## 2022-10-04 MED ORDER — BUPIVACAINE IN DEXTROSE 0.75-8.25 % IT SOLN
INTRATHECAL | Status: DC | PRN
  Administered 2022-10-04: 1.6 mL via INTRATHECAL

## 2022-10-04 MED ORDER — LACTATED RINGERS IV SOLN: INTRAVENOUS | Status: DC

## 2022-10-04 MED ORDER — OXYCODONE-ACETAMINOPHEN 5-325 MG PO TABS: 1.0000 | ORAL_TABLET | ORAL | Status: DC | PRN

## 2022-10-04 MED ORDER — ENOXAPARIN SODIUM 40 MG/0.4ML IJ SOSY
40.0000 mg | PREFILLED_SYRINGE | INTRAMUSCULAR | Status: DC
  Administered 2022-10-05 – 2022-10-06 (×2): 40 mg via SUBCUTANEOUS
  Filled 2022-10-04 (×2): qty 0.4

## 2022-10-04 MED ORDER — OXYTOCIN BOLUS FROM INFUSION: 333.0000 mL | Freq: Once | INTRAVENOUS | Status: DC

## 2022-10-04 MED ORDER — DIPHENHYDRAMINE HCL 25 MG PO CAPS
25.0000 mg | ORAL_CAPSULE | ORAL | Status: DC | PRN
  Administered 2022-10-05: 25 mg via ORAL
  Filled 2022-10-04: qty 1

## 2022-10-04 MED ORDER — OXYTOCIN-SODIUM CHLORIDE 30-0.9 UT/500ML-% IV SOLN: 2.5000 [IU]/h | INTRAVENOUS | Status: DC

## 2022-10-04 MED ORDER — DEXAMETHASONE SODIUM PHOSPHATE 10 MG/ML IJ SOLN
INTRAMUSCULAR | Status: DC | PRN
  Administered 2022-10-04: 10 mg via INTRAVENOUS

## 2022-10-04 MED ORDER — COCONUT OIL OIL: 1.0000 | TOPICAL_OIL | Status: DC | PRN

## 2022-10-04 MED ORDER — GABAPENTIN 100 MG PO CAPS
100.0000 mg | ORAL_CAPSULE | Freq: Two times a day (BID) | ORAL | Status: DC
  Administered 2022-10-04 – 2022-10-06 (×4): 100 mg via ORAL
  Filled 2022-10-04 (×4): qty 1

## 2022-10-04 MED ORDER — HYDROMORPHONE HCL 1 MG/ML IJ SOLN: 0.2500 mg | INTRAMUSCULAR | Status: DC | PRN

## 2022-10-04 MED ORDER — OXYCODONE-ACETAMINOPHEN 5-325 MG PO TABS: 2.0000 | ORAL_TABLET | ORAL | Status: DC | PRN

## 2022-10-04 MED ORDER — TETANUS-DIPHTH-ACELL PERTUSSIS 5-2.5-18.5 LF-MCG/0.5 IM SUSY: 0.5000 mL | PREFILLED_SYRINGE | Freq: Once | INTRAMUSCULAR | Status: DC

## 2022-10-04 MED ORDER — ACETAMINOPHEN 325 MG PO TABS: 650.0000 mg | ORAL_TABLET | ORAL | Status: DC | PRN

## 2022-10-04 MED ORDER — KETOROLAC TROMETHAMINE 30 MG/ML IJ SOLN: 30.0000 mg | Freq: Once | INTRAMUSCULAR | Status: DC | PRN

## 2022-10-04 MED ORDER — MORPHINE SULFATE (PF) 0.5 MG/ML IJ SOLN
INTRAMUSCULAR | Status: AC
  Filled 2022-10-04: qty 10

## 2022-10-04 MED ORDER — OXYTOCIN-SODIUM CHLORIDE 30-0.9 UT/500ML-% IV SOLN: 2.5000 [IU]/h | INTRAVENOUS | Status: AC

## 2022-10-04 MED ORDER — MORPHINE SULFATE (PF) 0.5 MG/ML IJ SOLN
INTRAMUSCULAR | Status: DC | PRN
  Administered 2022-10-04: 150 ug via INTRATHECAL

## 2022-10-04 MED ORDER — KETOROLAC TROMETHAMINE 30 MG/ML IJ SOLN
30.0000 mg | Freq: Four times a day (QID) | INTRAMUSCULAR | Status: AC
  Administered 2022-10-04 – 2022-10-05 (×3): 30 mg via INTRAVENOUS
  Filled 2022-10-04 (×4): qty 1

## 2022-10-04 MED ORDER — KETOROLAC TROMETHAMINE 30 MG/ML IJ SOLN
INTRAMUSCULAR | Status: DC | PRN
  Administered 2022-10-04: 30 mg via INTRAVENOUS

## 2022-10-04 MED ORDER — SOD CITRATE-CITRIC ACID 500-334 MG/5ML PO SOLN
30.0000 mL | ORAL | Status: DC | PRN
  Administered 2022-10-04: 30 mL via ORAL
  Filled 2022-10-04: qty 30

## 2022-10-04 MED ORDER — LIDOCAINE HCL (PF) 1 % IJ SOLN: 30.0000 mL | INTRAMUSCULAR | Status: DC | PRN

## 2022-10-04 MED ORDER — SENNOSIDES-DOCUSATE SODIUM 8.6-50 MG PO TABS
2.0000 | ORAL_TABLET | Freq: Every day | ORAL | Status: DC
  Administered 2022-10-05 – 2022-10-06 (×2): 2 via ORAL
  Filled 2022-10-04 (×2): qty 2

## 2022-10-04 MED ORDER — WITCH HAZEL-GLYCERIN EX PADS: 1.0000 | MEDICATED_PAD | CUTANEOUS | Status: DC | PRN

## 2022-10-04 MED ORDER — TRANEXAMIC ACID-NACL 1000-0.7 MG/100ML-% IV SOLN
INTRAVENOUS | Status: AC
  Filled 2022-10-04: qty 100

## 2022-10-04 MED ORDER — DIBUCAINE (PERIANAL) 1 % EX OINT: 1.0000 | TOPICAL_OINTMENT | CUTANEOUS | Status: DC | PRN

## 2022-10-04 MED ORDER — FENTANYL CITRATE (PF) 100 MCG/2ML IJ SOLN
INTRAMUSCULAR | Status: DC | PRN
  Administered 2022-10-04: 15 ug via INTRATHECAL

## 2022-10-04 MED ORDER — DIPHENHYDRAMINE HCL 50 MG/ML IJ SOLN
12.5000 mg | INTRAMUSCULAR | Status: DC | PRN
  Administered 2022-10-04 (×2): 12.5 mg via INTRAVENOUS
  Filled 2022-10-04 (×2): qty 1

## 2022-10-04 MED ORDER — ZOLPIDEM TARTRATE 5 MG PO TABS: 5.0000 mg | ORAL_TABLET | Freq: Every evening | ORAL | Status: DC | PRN

## 2022-10-04 MED ORDER — PRENATAL MULTIVITAMIN CH
1.0000 | ORAL_TABLET | Freq: Every day | ORAL | Status: DC
  Administered 2022-10-05 – 2022-10-06 (×2): 1 via ORAL
  Filled 2022-10-04 (×2): qty 1

## 2022-10-04 MED ORDER — OXYCODONE HCL 5 MG PO TABS: 5.0000 mg | ORAL_TABLET | Freq: Once | ORAL | Status: DC | PRN

## 2022-10-04 MED ORDER — TRANEXAMIC ACID-NACL 1000-0.7 MG/100ML-% IV SOLN
INTRAVENOUS | Status: DC | PRN
  Administered 2022-10-04: 1000 mg via INTRAVENOUS

## 2022-10-04 MED ORDER — PHENYLEPHRINE HCL-NACL 20-0.9 MG/250ML-% IV SOLN
INTRAVENOUS | Status: DC | PRN
  Administered 2022-10-04: 60 ug/min via INTRAVENOUS

## 2022-10-04 MED ORDER — OXYTOCIN-SODIUM CHLORIDE 30-0.9 UT/500ML-% IV SOLN
INTRAVENOUS | Status: DC | PRN
  Administered 2022-10-04: 300 mL via INTRAVENOUS

## 2022-10-04 MED ORDER — OXYTOCIN-SODIUM CHLORIDE 30-0.9 UT/500ML-% IV SOLN
INTRAVENOUS | Status: AC
  Filled 2022-10-04: qty 500

## 2022-10-04 MED ORDER — ONDANSETRON HCL 4 MG/2ML IJ SOLN: 4.0000 mg | Freq: Four times a day (QID) | INTRAMUSCULAR | Status: DC | PRN

## 2022-10-04 MED ORDER — ONDANSETRON HCL 4 MG/2ML IJ SOLN
INTRAMUSCULAR | Status: AC
  Filled 2022-10-04: qty 2

## 2022-10-04 MED ORDER — ONDANSETRON HCL 4 MG/2ML IJ SOLN
INTRAMUSCULAR | Status: DC | PRN
  Administered 2022-10-04: 4 mg via INTRAVENOUS

## 2022-10-04 MED ORDER — NALOXONE HCL 4 MG/10ML IJ SOLN: 1.0000 ug/kg/h | INTRAVENOUS | Status: DC | PRN

## 2022-10-04 MED ORDER — OXYCODONE HCL 5 MG PO TABS
5.0000 mg | ORAL_TABLET | ORAL | Status: DC | PRN
  Administered 2022-10-05: 5 mg via ORAL
  Administered 2022-10-05 – 2022-10-06 (×3): 10 mg via ORAL
  Filled 2022-10-04: qty 2
  Filled 2022-10-04: qty 1
  Filled 2022-10-04 (×2): qty 2

## 2022-10-04 SURGICAL SUPPLY — 38 items
APL PRP STRL LF DISP 70% ISPRP (MISCELLANEOUS) ×2
APL SKNCLS STERI-STRIP NONHPOA (GAUZE/BANDAGES/DRESSINGS) ×1
BARRIER ADHS 3X4 INTERCEED (GAUZE/BANDAGES/DRESSINGS) IMPLANT
BENZOIN TINCTURE PRP APPL 2/3 (GAUZE/BANDAGES/DRESSINGS) IMPLANT
BRR ADH 4X3 ABS CNTRL BYND (GAUZE/BANDAGES/DRESSINGS)
CHLORAPREP W/TINT 26 (MISCELLANEOUS) ×2 IMPLANT
CLAMP UMBILICAL CORD (MISCELLANEOUS) ×1 IMPLANT
CLOTH BEACON ORANGE TIMEOUT ST (SAFETY) ×1 IMPLANT
DRSG OPSITE POSTOP 4X10 (GAUZE/BANDAGES/DRESSINGS) ×1 IMPLANT
ELECT REM PT RETURN 9FT ADLT (ELECTROSURGICAL) ×1
ELECTRODE REM PT RTRN 9FT ADLT (ELECTROSURGICAL) ×1 IMPLANT
EXTRACTOR VACUUM KIWI (MISCELLANEOUS) IMPLANT
GAUZE SPONGE 4X4 12PLY STRL LF (GAUZE/BANDAGES/DRESSINGS) IMPLANT
GLOVE BIO SURGEON STRL SZ 6.5 (GLOVE) ×1 IMPLANT
GLOVE BIOGEL PI IND STRL 7.0 (GLOVE) ×2 IMPLANT
GOWN STRL REUS W/TWL LRG LVL3 (GOWN DISPOSABLE) ×2 IMPLANT
KIT ABG SYR 3ML LUER SLIP (SYRINGE) IMPLANT
NDL HYPO 25X5/8 SAFETYGLIDE (NEEDLE) IMPLANT
NEEDLE HYPO 22GX1.5 SAFETY (NEEDLE) IMPLANT
NEEDLE HYPO 25X5/8 SAFETYGLIDE (NEEDLE) IMPLANT
NS IRRIG 1000ML POUR BTL (IV SOLUTION) ×1 IMPLANT
PACK C SECTION WH (CUSTOM PROCEDURE TRAY) ×1 IMPLANT
PAD ABD 7.5X8 STRL (GAUZE/BANDAGES/DRESSINGS) IMPLANT
PAD OB MATERNITY 4.3X12.25 (PERSONAL CARE ITEMS) ×1 IMPLANT
RETRACTOR WND ALEXIS 25 LRG (MISCELLANEOUS) IMPLANT
RTRCTR WOUND ALEXIS 25CM LRG (MISCELLANEOUS)
STRIP CLOSURE SKIN 1/2X4 (GAUZE/BANDAGES/DRESSINGS) IMPLANT
SUT PLAIN 2 0 (SUTURE) ×1
SUT PLAIN ABS 2-0 CT1 27XMFL (SUTURE) IMPLANT
SUT VIC AB 0 CT1 36 (SUTURE) ×6 IMPLANT
SUT VIC AB 2-0 CT1 27 (SUTURE) ×1
SUT VIC AB 2-0 CT1 TAPERPNT 27 (SUTURE) ×1 IMPLANT
SUT VIC AB 4-0 KS 27 (SUTURE) IMPLANT
SUT VIC AB 4-0 PS2 27 (SUTURE) ×1 IMPLANT
SYR CONTROL 10ML LL (SYRINGE) IMPLANT
TOWEL OR 17X24 6PK STRL BLUE (TOWEL DISPOSABLE) ×1 IMPLANT
TRAY FOLEY W/BAG SLVR 14FR LF (SET/KITS/TRAYS/PACK) IMPLANT
WATER STERILE IRR 1000ML POUR (IV SOLUTION) ×1 IMPLANT

## 2022-10-04 NOTE — Anesthesia Preprocedure Evaluation (Signed)
Anesthesia Evaluation  Patient identified by MRN, date of birth, ID band Patient awake    Reviewed: Allergy & Precautions, NPO status , Patient's Chart, lab work & pertinent test results  Airway Mallampati: II  TM Distance: >3 FB Neck ROM: Full    Dental no notable dental hx.    Pulmonary neg pulmonary ROS, Current Smoker and Patient abstained from smoking.   Pulmonary exam normal breath sounds clear to auscultation       Cardiovascular hypertension, Pt. on medications Normal cardiovascular exam Rhythm:Regular Rate:Normal     Neuro/Psych negative neurological ROS  negative psych ROS   GI/Hepatic negative GI ROS, Neg liver ROS,,,  Endo/Other  negative endocrine ROS    Renal/GU negative Renal ROS  negative genitourinary   Musculoskeletal negative musculoskeletal ROS (+)    Abdominal   Peds  Hematology negative hematology ROS (+)   Anesthesia Other Findings   Reproductive/Obstetrics (+) Pregnancy                             Anesthesia Physical Anesthesia Plan  ASA: 3  Anesthesia Plan: Spinal   Post-op Pain Management:    Induction:   PONV Risk Score and Plan: 2 and Treatment may vary due to age or medical condition  Airway Management Planned: Natural Airway  Additional Equipment:   Intra-op Plan:   Post-operative Plan:   Informed Consent: I have reviewed the patients History and Physical, chart, labs and discussed the procedure including the risks, benefits and alternatives for the proposed anesthesia with the patient or authorized representative who has indicated his/her understanding and acceptance.       Plan Discussed with: Anesthesiologist  Anesthesia Plan Comments: (Patient identified. Risks, benefits, options discussed with patient including but not limited to bleeding, infection, nerve damage, paralysis, failed block, incomplete pain control, headache, blood  pressure changes, nausea, vomiting, reactions to medication, itching, and post partum back pain. Confirmed with bedside nurse the patient's most recent platelet count. Confirmed with the patient that they are not taking any anticoagulation, have any bleeding history or any family history of bleeding disorders. Patient expressed understanding and wishes to proceed. All questions were answered. )        Anesthesia Quick Evaluation

## 2022-10-04 NOTE — H&P (Signed)
Faculty Practice H&P  Melanie Strong is a 37 y.o. female G71P1011 with IUP at 70w1dpresenting for repeat cesarean section for elective repeat . Pregnancy was been complicated by hx CS, hx preeclampsia  .    Pt states she has been having no contractions, no vaginal bleeding, intact membranes, with normal fetal movement.     Prenatal Course Source of Care:  [facility]  with onset of care at 10 weeks  Pregnancy complications or risks: Patient Active Problem List   Diagnosis Date Noted   History of cesarean section 06/19/2022   History of ELISA positive for HSV 06/19/2022   Multigravida of advanced maternal age in second trimester 04/24/2022   Hx of preeclampsia, prior pregnancy, currently pregnant 03/20/2022   Supervision of other normal pregnancy, antepartum 03/04/2022   Alpha thalassemia silent carrier 02/13/2021   Carrier of beta thalassemia 02/13/2021   Carrier of spinal muscular atrophy 02/13/2021   Anemia affecting pregnancy in first trimester 02/02/2021   She desires condoms for contraception.  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   Glucose found in urine on examination    History of ileus    Infection    UTI   Pregnancy induced hypertension     Past Surgical History:  Past Surgical History:  Procedure Laterality Date   CESAREAN SECTION  08/23/2021   Procedure: CESAREAN SECTION;  Surgeon: NCaren Macadam MD;  Location: [facility] LD [facility]S;  Service: Obstetrics;;   NO PAST SURGERIES      Obstetrical History:  OB History     Gravida  3   Para  1    Term  1   Preterm      AB  1   Living  1      SAB  1   IAB      Ectopic      Multiple  0   Live Births  1           Gynecological History:  OB History     Gravida  3   Para  1   Term  1   Preterm      AB  1   Living  1      SAB  1   IAB      Ectopic      Multiple  0   Live Births  1           Social History:  Social History   Socioeconomic History   Marital status: Married    Spouse name: Not on file   Number of children: Not on file   Years of education: Not on file   Highest education level: Not on file  Occupational History   Not on file  Tobacco Use   Smoking status: Every Day    Packs/day: 0.25    Years: 10.00    Total pack years: 2.50    Types: Cigarettes   Smokeless tobacco: Never  Vaping Use   Vaping Use: Never used  Substance and Sexual Activity   Alcohol use: No  Comment: rare   Drug use: No   Sexual activity: Not Currently    Partners: Male    Birth control/protection: None  Other Topics Concern   Not on file  Social History Narrative   Not on file   Social Determinants of Health   Financial Resource Strain: Not on file  Food Insecurity: No Food Insecurity (10/04/2022)   Hunger Vital Sign    Worried About Running Out of Food in the Last Year: Never true    Ran Out of Food in the Last Year: Never true  Transportation Needs: No Transportation Needs (10/04/2022)   PRAPARE - Hydrologist (Medical): No    Lack of Transportation (Non-Medical): No  Physical Activity: Not on file  Stress: Not on file  Social Connections: Not on file    Family History:  Family History  Problem Relation Age of Onset   Hypertension Mother    Diabetes Mother        Boarderline   Lupus Mother    Cancer Father        rectal   Sickle cell anemia Sister    Healthy Brother    Healthy Brother    Cancer Paternal Aunt        brain   Diabetes Maternal Grandmother    Other Neg Hx    Asthma Neg Hx     Heart disease Neg Hx    Stroke Neg Hx     Medications:  Prenatal vitamins,  Current Facility-Administered Medications  Medication Dose Route Frequency Provider Last Rate Last Admin   acetaminophen (TYLENOL) tablet 650 mg  650 mg Oral Q4H PRN Constant, Peggy, MD       lactated ringers infusion 500-1,000 mL  500-1,000 mL Intravenous PRN Constant, Peggy, MD       lactated ringers infusion   Intravenous Continuous Constant, Peggy, MD       lidocaine (PF) (XYLOCAINE) 1 % injection 30 mL  30 mL Subcutaneous PRN Constant, Peggy, MD       ondansetron (ZOFRAN) injection 4 mg  4 mg Intravenous Q6H PRN Constant, Peggy, MD       oxyCODONE-acetaminophen (PERCOCET/ROXICET) 5-325 MG per tablet 1 tablet  1 tablet Oral Q4H PRN Constant, Peggy, MD       oxyCODONE-acetaminophen (PERCOCET/ROXICET) 5-325 MG per tablet 2 tablet  2 tablet Oral Q4H PRN Constant, Peggy, MD       oxytocin (PITOCIN) IV BOLUS FROM BAG  333 mL Intravenous Once Constant, Peggy, MD       oxytocin (PITOCIN) IV infusion 30 units in NS 500 mL - Premix  2.5 Units/hr Intravenous Continuous Constant, Peggy, MD       sodium citrate-citric acid (ORACIT) solution 30 mL  30 mL Oral Q2H PRN Constant, Peggy, MD        Allergies: No Known Allergies  Review of Systems: -  Negative   Physical Exam: Blood pressure 124/70, pulse 89, temperature 98.8 F (37.1 C), temperature source Oral, resp. rate 16, height 5' 5"$  (1.651 m), weight 91.9 kg, last menstrual period 01/03/2022, currently breastfeeding. GENERAL: Well-developed, well-nourished female in no acute distress.  LUNGS: Normal work of breathing  HEART: Regular rate and rhythm. ABDOMEN: Soft, nontender, nondistended, gravid. EFW 7 lbs  Pertinent Labs/Studies:   Lab Results  Component Value Date   WBC 8.6 10/04/2022   HGB 11.4 (L) 10/04/2022   HCT 34.5 (L) 10/04/2022   MCV 83.9 10/04/2022   PLT 200 10/04/2022  Assessment : Melanie Strong is a 37 y.o. G3P1011 at 69w1dbeing  admitted for cesarean section secondary to elective repeat   Plan: The risks of cesarean section discussed with the patient included but were not limited to: bleeding which may require transfusion or reoperation; infection which may require antibiotics; injury to bowel, bladder, ureters or other surrounding organs; injury to the fetus; need for additional procedures including hysterectomy in the event of a life-threatening hemorrhage; placental abnormalities wth subsequent pregnancies, incisional problems, thromboembolic phenomenon and other postoperative/anesthesia complications. The patient concurred with the proposed plan, giving informed written consent for the procedure.   Patient has been NPO since 5 am and will remain NPO for procedure.  Preoperative prophylactic Ancef ordered on call to the OR.    CConcepcion Living MD 10/04/2022, 12:08 PM

## 2022-10-04 NOTE — Anesthesia Procedure Notes (Signed)
Spinal  Patient location during procedure: OB Start time: 10/04/2022 2:41 PM End time: 10/04/2022 2:46 PM Reason for block: surgical anesthesia Staffing Anesthesiologist: Lynda Rainwater, MD Performed by: Lynda Rainwater, MD Authorized by: Lynda Rainwater, MD   Preanesthetic Checklist Completed: patient identified, IV checked, risks and benefits discussed, surgical consent, monitors and equipment checked, pre-op evaluation and timeout performed Spinal Block Patient position: sitting Prep: DuraPrep and site prepped and draped Patient monitoring: heart rate, cardiac monitor, continuous pulse ox and blood pressure Approach: midline Location: L3-4 Injection technique: single-shot Needle Needle type: Pencan  Needle gauge: 24 G Needle length: 10 cm Assessment Sensory level: T4 Events: CSF return Additional Notes SAB placed by SRNA under direct supervision

## 2022-10-04 NOTE — Progress Notes (Signed)
Patient ID: Melanie Strong, female   DOB: [birth year], 37 y.o.   MRN: [id number] Patient declines TOLAC and requests RCSThe risks of surgery were discussed with the patient including but were not limited to: bleeding which may require transfusion or reoperation; infection which may require antibiotics; injury to bowel, bladder, ureters or other surrounding organs; injury to the fetus; need for additional procedures including hysterectomy in the event of a life-threatening hemorrhage; formation of adhesions; placental abnormalities with subsequent pregnancies; incisional problems; thromboembolic phenomenon and other postoperative/anesthesia complications.  The patient concurred with the proposed plan, giving informed written consent for the procedure.   Patient has been NPO since 0000 she will remain NPO for procedure. Anesthesia and OR aware. Preoperative prophylactic antibiotics and SCDs ordered on call to the OR.  To OR when ready.   Woodroe Mode, MD 10/04/2022 11:20 AM

## 2022-10-04 NOTE — Anesthesia Postprocedure Evaluation (Signed)
Anesthesia Post Note  Patient: Melanie Strong  Procedure(s) Performed: CESAREAN SECTION     Patient location during evaluation: PACU Anesthesia Type: Spinal Level of consciousness: awake and alert Pain management: pain level controlled Vital Signs Assessment: post-procedure vital signs reviewed and stable Respiratory status: spontaneous breathing, nonlabored ventilation and respiratory function stable Cardiovascular status: blood pressure returned to baseline and stable Postop Assessment: no apparent nausea or vomiting Anesthetic complications: no   No notable events documented.  Last Vitals:  Vitals:   10/04/22 1715 10/04/22 1728  BP: 97/78 117/70  Pulse: 66 83  Resp: 16 18  Temp:  36.7 C  SpO2: 99% 97%    Last Pain:  Vitals:   10/04/22 1728  TempSrc: Oral  PainSc: 0-No pain   Pain Goal:    LLE Motor Response: Purposeful movement (10/04/22 1715) LLE Sensation: Tingling (10/04/22 1715) RLE Motor Response: Purposeful movement (10/04/22 1715) RLE Sensation: Tingling (10/04/22 1715)     Epidural/Spinal Function Cutaneous sensation: Tingles (10/04/22 1715), Patient able to flex knees: No (10/04/22 1715), Patient able to lift hips off bed: No (10/04/22 1715), Back pain beyond tenderness at insertion site: No (10/04/22 1715), Progressively worsening motor and/or sensory loss: No (10/04/22 1715), Bowel and/or bladder incontinence post epidural: No (10/04/22 1715)  Lynda Rainwater

## 2022-10-04 NOTE — Transfer of Care (Signed)
Immediate Anesthesia Transfer of Care Note  Patient: Melanie Strong  Procedure(s) Performed: CESAREAN SECTION  Patient Location: PACU  Anesthesia Type:Epidural  Level of Consciousness: awake, alert , and oriented  Airway & Oxygen Therapy: Patient Spontanous Breathing  Post-op Assessment: Report given to RN and Post -op Vital signs reviewed and stable  Post vital signs: Reviewed and stable HR 73, RR 18, BP 106/71, SaO2 97%  Last Vitals:  Vitals Value Taken Time  BP    Temp    Pulse    Resp    SpO2      Last Pain:  Vitals:   10/04/22 0756  TempSrc: Oral  PainSc: 0-No pain         Complications: No notable events documented.

## 2022-10-05 ENCOUNTER — Encounter (HOSPITAL_COMMUNITY): Payer: Self-pay | Admitting: Obstetrics & Gynecology

## 2022-10-05 LAB — CBC
HCT: 32.3 % — ABNORMAL LOW (ref 36.0–46.0)
Hemoglobin: 10.6 g/dL — ABNORMAL LOW (ref 12.0–15.0)
MCH: 27.2 pg (ref 26.0–34.0)
MCHC: 32.8 g/dL (ref 30.0–36.0)
MCV: 83 fL (ref 80.0–100.0)
Platelets: 213 10*3/uL (ref 150–400)
RBC: 3.89 MIL/uL (ref 3.87–5.11)
RDW: 15.2 % (ref 11.5–15.5)
WBC: 14.7 10*3/uL — ABNORMAL HIGH (ref 4.0–10.5)
nRBC: 0 % (ref 0.0–0.2)

## 2022-10-05 MED ORDER — NALBUPHINE HCL 10 MG/ML IJ SOLN
5.0000 mg | Freq: Once | INTRAMUSCULAR | Status: AC
  Administered 2022-10-05: 5 mg via INTRAVENOUS
  Filled 2022-10-05: qty 1

## 2022-10-05 NOTE — Progress Notes (Signed)
Post Partum Day 1 Subjective: no complaints, up ad lib, tolerating PO, and + flatus Foley recently d/c'd, due to void.   Objective: Blood pressure 114/60, pulse 75, temperature 98.8 F (37.1 C), temperature source Oral, resp. rate 18, height 5\' 5"  (1.651 m), weight 91.9 kg, last menstrual period 01/03/2022, SpO2 98 %, unknown if currently breastfeeding.  Physical Exam:  General: alert, cooperative, and no distress Lochia: appropriate Heart: RRR Lungs: CTAB Uterine Fundus: firm Incision: pressure dsg c/d/i DVT Evaluation: No evidence of DVT seen on physical exam. Negative Homan's sign. No cords or calf tenderness. No significant calf/ankle edema.  Recent Labs    10/04/22 0724 10/05/22 0528  HGB 11.4* 10.6*  HCT 34.5* 32.3*    Assessment/Plan: Plan for discharge tomorrow, Breastfeeding, Lactation consult, and Contraception undecided   LOS: 1 day   Julianne Handler, CNM 10/05/2022, 8:02 AM

## 2022-10-05 NOTE — Lactation Note (Signed)
This note was copied from a baby's chart. Lactation Consultation Note  Patient Name: Melanie Strong [id number] Date: 10/05/2022 Reason for consult: Follow-up assessment;Difficult latch;Term;Breastfeeding assistance;Infant weight loss (2.39% WL) Age:36 hours  LC entered the room and the birth parent was holding the infant.  Per the birth parent, the infant has been latching to the left breast only. She said that her right breast has not gotten any stimulation.  The birth parent stated that she has been pumping with her hands-free pump, but she is not seeing anything.  The birth parent attempted to latch the infant to the right breast, but he was uninterested in feeding.  The birth parent commented that the infant always acts uninterested in feeding on that side.  The infant was taken away to be circumcised.  LC encouraged the birth parent to try pumping with the DEBP to give her stimulation on the right side.  The birth parent was receptive.  LC set up the DEBP.  LC reviewed washing, assembling and disassembling, milk storage, and pumping frequency.  LC also gave the birth parent coconut oil per her request.  All questions were answered.    Infant Feeding Plan:  Breastfeed 8+ times in 24 hours according to feeding cues.  Pump every 3 hours on the right breast and feed any expressed milk to the infant via a bottle/syringe. Call [facility] for breastfeeding assistance.   Lactation Tools Discussed/Used Tools: Pump Breast pump type: Double-Electric Breast Pump Pump Education: Setup, frequency, and cleaning;Milk Storage Reason for Pumping: Infant having short feedings and only latching on one side Pumping frequency: q3hrs/PRN  Interventions Interventions: Breast feeding basics reviewed;DEBP;Education  Discharge    Consult Status Consult Status: Follow-up Date: 10/06/22 Follow-up type: In-patient    Lysbeth Penner 10/05/2022, 4:41 PM

## 2022-10-05 NOTE — Lactation Note (Signed)
This note was copied from a baby's chart. Lactation Consultation Note  Patient Name: Melanie Strong [id number] Date: 10/05/2022 Reason for consult: Initial assessment;1st time breastfeeding;Term Age:36 hours Mom stated baby had BF well prior to [facility] coming in. Baby cueing. Mom placed baby back on the breast. Baby BF well. Mom happy. Mom didn't BF her now 34 month old. Mom wants to BF this baby. Newborn feeding habits, behavior, STS, I&O, positioning, support reviewed. Mom encouraged to feed baby 8-12 times/24 hours and with feeding cues.  Answered questions mom had. Praised mom for BF so well. Encouraged mom to call for questions or concerns.  Maternal Data Has patient been taught Hand Expression?: Yes Does the patient have breastfeeding experience prior to this delivery?: No  Feeding    LATCH Score Latch: Grasps breast easily, tongue down, lips flanged, rhythmical sucking.  Audible Swallowing: A few with stimulation  Type of Nipple: Everted at rest and after stimulation  Comfort (Breast/Nipple): Soft / non-tender  Hold (Positioning): Assistance needed to correctly position infant at breast and maintain latch.  LATCH Score: 8   Lactation Tools Discussed/Used    Interventions Interventions: Breast feeding basics reviewed;Adjust position;Assisted with latch;Support pillows;Skin to skin;Position options;Breast compression;LC Services brochure  Discharge    Consult Status Consult Status: Follow-up Date: 10/05/22 (in pm) Follow-up type: In-patient    Keevan Wolz, [place] 10/05/2022, 1:29 AM

## 2022-10-05 NOTE — Progress Notes (Signed)
Circumcision Consent   -Circumcision procedure details discussed including usage of local anesthesia, infant soother, and removal and disposal of foreskin. -Risks and benefits of procedure were reviewed including, but not limited to:  *Benefits include reduction in the rates of urinary tract infection (UTI), penile cancer, some sexually transmitted infections, penile inflammatory, and retractile disorders, as well as easier hygiene.   *Risks include bleeding, infection, injury of glans which may lead to need for additional surgery, penile deformity, or urinary tract issues, unsatisfactory cosmetic appearance and other potential complications related to the procedure.   -Informed that procedure will not be performed if provider deems inappropriate d/t penile size, noted deformity, or unsatisfactory pediatric evaluation. -It was emphasized that this is an elective procedure.   -Post circumcision care discussed.   Patient wants to proceed with circumcision. Informed that team would be notified and complete prior to discharge and upon satisfactory pediatric evaluation of infant.    Shelda Pal, [place] Fellow, Faculty practice [place] for [facility] 11:26 AM

## 2022-10-06 MED ORDER — OXYCODONE HCL 5 MG PO TABS: 5.0000 mg | ORAL_TABLET | Freq: Four times a day (QID) | ORAL | 0 refills | Status: DC | PRN

## 2022-10-06 MED ORDER — VALACYCLOVIR HCL 500 MG PO TABS: 500.0000 mg | ORAL_TABLET | Freq: Two times a day (BID) | ORAL | 6 refills | Status: DC | PRN

## 2022-10-06 MED ORDER — POLYETHYLENE GLYCOL 3350 17 GM/SCOOP PO POWD: 17.0000 g | Freq: Two times a day (BID) | ORAL | 2 refills | Status: DC | PRN

## 2022-10-06 MED ORDER — IBUPROFEN 600 MG PO TABS: 600.0000 mg | ORAL_TABLET | Freq: Four times a day (QID) | ORAL | 1 refills | Status: DC

## 2022-10-06 MED ORDER — ACETAMINOPHEN 325 MG PO TABS: 650.0000 mg | ORAL_TABLET | ORAL | 0 refills | Status: DC | PRN

## 2022-10-06 NOTE — Lactation Note (Signed)
This note was copied from a baby's chart. Lactation Consultation Note  Patient Name: Melanie Strong ATFTD'D Date: 10/06/2022 Reason for consult: Follow-up assessment;1st time breastfeeding;Term Age:36 hours   P2: Term infant at 39+1 weeks Feeding preference: Breast Weight loss: 8%  "[name]" was beginning to show feeding cues when I arrived.  Offered to assist with latching; mother receptive.  Mother familiar with hand expression and has been able to express colostrum.  Mother commented that she can easily latch to her left breast, however, has more difficulty with her right breast.  Suggested we latch to the right breast for practice.  Assisted to latch easily in the football hold.  Observed "[name]" feeding for 15 minutes while reviewing breast feeding basics.  Mother able to identify a deep latch.  Last LATCH score was an 8; multiple voids/stools.  Mother has our op phone number for any questions/concerns after discharge.  No support person present at this time.  RN updated.   Maternal Data    Feeding Mother's Current Feeding Choice: Breast Milk  LATCH Score Latch: Grasps breast easily, tongue down, lips flanged, rhythmical sucking.  Audible Swallowing: A few with stimulation  Type of Nipple: Everted at rest and after stimulation  Comfort (Breast/Nipple): Soft / non-tender  Hold (Positioning): Assistance needed to correctly position infant at breast and maintain latch.  LATCH Score: 8   Lactation Tools Discussed/Used    Interventions Interventions: Breast feeding basics reviewed;Assisted with latch;Skin to skin;Breast massage;Hand express;Breast compression;Position options;Support pillows;Adjust position;Education  Discharge Discharge Education: Engorgement and breast care Pump: Hands Free  Consult Status Consult Status: Complete Date: 10/06/22 Follow-up type: Call as needed    Kaven Cumbie R Tamar Lipscomb 10/06/2022, 5:46 AM

## 2022-10-06 NOTE — Progress Notes (Signed)
Subjective: Postpartum Day 2: Cesarean Delivery Patient reports tolerating PO, + flatus, and no problems voiding.    Objective: Vital signs in last 24 hours: Temp:  [97.4 F (36.3 C)-97.8 F (36.6 C)] 97.6 F (36.4 C) (02/04 0519) Pulse Rate:  [54-70] 54 (02/04 0519) Resp:  [18] 18 (02/04 0519) BP: (111-128)/(72-79) 116/73 (02/04 0519) SpO2:  [97 %-100 %] 99 % (02/04 0519)  Physical Exam:  General: alert, cooperative, and no distress Lochia: appropriate Uterine Fundus: firm Incision: healing well, small amount of old blood on dressing. DVT Evaluation: No evidence of DVT seen on physical exam.  Recent Labs    10/04/22 0724 10/05/22 0528  HGB 11.4* 10.6*  HCT 34.5* 32.3*    Assessment/Plan: Status post Cesarean section. Doing well postoperatively.  Discharge home with standard precautions and return to clinic in 4-6 weeks.  Manya Silvas, [place] 10/06/2022, 12:20 PM

## 2022-10-06 NOTE — Discharge Summary (Signed)
Postpartum Discharge Summary  Date of Service updated***     Patient Name: Melanie Strong DOB: Jun 19, 1986 MRN: [id number]  Date of admission: 10/04/2022 Delivery date:10/04/2022  Delivering provider: Woodroe Mode  Date of discharge: 10/06/2022  Admitting diagnosis: Encounter for induction of labor [Z34.90] Intrauterine pregnancy: [redacted]w[redacted]d     Secondary diagnosis:  Principal Problem:   Cesarean delivery delivered  Additional problems: ***    Discharge diagnosis: {DX.:23714}                                              Post partum procedures:{Postpartum procedures:23558} Augmentation: {JASNKNLZJQBH:41937} Complications: {OB Labor/Delivery Complications:20784}  Hospital course: {Courses:23701}  Magnesium Sulfate received: {Mag received:30440022} BMZ received: {BMZ received:30440023} Rhophylac:{Rhophylac received:30440032} TKW:{IOX:73532992} T-DaP:{Tdap:23962} Flu: {EQA:83419} Transfusion:{Transfusion received:30440034}  Physical exam  Vitals:   10/05/22 0309 10/05/22 1456 10/05/22 2044 10/06/22 0519  BP: 114/60 111/72 128/79 116/73  Pulse: 75 70 68 (!) 54  Resp:  18 18 18   Temp: 98.8 F (37.1 C) 97.8 F (36.6 C) (!) 97.4 F (36.3 C) 97.6 F (36.4 C)  TempSrc: Oral Oral Oral Oral  SpO2: 98% 97% 100% 99%  Weight:      Height:       General: {Exam; general:21111117} Lochia: {Desc; appropriate/inappropriate:30686::"appropriate"} Uterine Fundus: {Desc; firm/soft:30687} Incision: {Exam; incision:21111123} DVT Evaluation: {Exam; dvt:2111122} Labs: Lab Results  Component Value Date   WBC 14.7 (H) 10/05/2022   HGB 10.6 (L) 10/05/2022   HCT 32.3 (L) 10/05/2022   MCV 83.0 10/05/2022   PLT 213 10/05/2022      Latest Ref Rng & Units 10/04/2022    8:05 AM  CMP  Creatinine 0.44 - 1.00 mg/dL 0.60    Edinburgh Score:    10/04/2022    5:30 PM  Edinburgh Postnatal Depression Scale Screening Tool  I have been able to laugh and see the funny side of things. 0  I have  looked forward with enjoyment to things. 0  I have blamed myself unnecessarily when things went wrong. 0  I have been anxious or worried for no good reason. 0  I have felt scared or panicky for no good reason. 0  Things have been getting on top of me. 0  I have been so unhappy that I have had difficulty sleeping. 0  I have felt sad or miserable. 0  I have been so unhappy that I have been crying. 0  The thought of harming myself has occurred to me. 0  Edinburgh Postnatal Depression Scale Total 0     After visit meds:  Allergies as of 10/06/2022   No Known Allergies   Med Rec must be completed prior to using this [facility]***        Discharge home in stable condition Infant Feeding: {Baby feeding:23562} Infant Disposition:{CHL IP OB HOME WITH QQIWLN:98921} Discharge instruction: per After Visit Summary and Postpartum booklet. Activity: Advance as tolerated. Pelvic rest for 6 weeks.  Diet: {OB JHER:74081448} Future Appointments:No future appointments. Follow up Visit:  [place] Follow up.   Why: For postpartum visit or sooner as needed Contact information: [place] [phone] [place] [facility] Follow up.   Specialty: Obstetrics and Gynecology Why: As needed in emergencies Contact information: [address] [id number] [place] [phone]                 Please schedule this patient for a {Visit type:23955} postpartum visit in {Postpartum visit:23953} with the following provider: {Provider type:23954}. Additional Postpartum F/U:{PP Procedure:23957}  {Risk BWLSL:37342} pregnancy complicated by: {AJGOTLXBWIOM:35597} Delivery mode:  C-Section, Low Transverse  Anticipated Birth Control:  {Birth Control:23956}   10/06/2022 Manya Silvas, CNM

## 2022-10-12 NOTE — Op Note (Signed)
Operative Note   Patient: SHAKYA FEUER  Date of Procedure: 10/04/2022  Procedure: Repeat Low Transverse Cesarean   Indications: patient declines vag del attempt, elective repeat, previous uterine incision: low transverse  Pre-operative Diagnosis: repeat cesarean section.   Post-operative Diagnosis: Same  TOLAC Candidate: No  Surgeon: Surgeon(s) and Role:    * Woodroe Mode, MD - Primary    * Caron Presume, Angelyn Punt, MD - Assisting  Assistants: none  An experienced assistant was required given the standard of surgical care given the complexity of the case.  This assistant was needed for exposure, dissection, suctioning, retraction, instrument exchange, assisting with delivery with administration of fundal pressure, and for overall help during the procedure.   Anesthesia: spinal  Anesthesiologist: No responsible provider has been recorded for the case.   Antibiotics: Cefazolin   Estimated Blood Loss: 497 ml   Total IV Fluids: 1800 ml  Urine Output:  100 cc OF clear urine  Specimens: Placenta    Complications: no complications   Indications: TOREY CHESTNUT is a 37 y.o. [id number] with an IUP 50w1dpresenting for scheduled cesarean secondary to the indications listed above.  The risks of cesarean section discussed with the patient included but were not limited to: bleeding which may require transfusion or reoperation; infection which may require antibiotics; injury to bowel, bladder, ureters or other surrounding organs; injury to the fetus; need for additional procedures including hysterectomy in the event of a life-threatening hemorrhage; placental abnormalities with subsequent pregnancies, incisional problems, thromboembolic phenomenon and other postoperative/anesthesia complications. The patient concurred with the proposed plan, giving informed written consent for the procedure. Patient has been NPO since last night she will remain NPO for procedure. Anesthesia and OR aware.  Preoperative prophylactic antibiotics and SCDs ordered on call to the OR.    Findings: Viable infant in cephalic presentation, no nuchal cord present. Apgars 8 , 9 , . Weight 3.77 kg . Clear amniotic fluid. Normal placenta, three vessel cord. Normal uterus, Normal bilateral fallopian tubes, Normal bilateral ovaries.  Procedure Details: A Time Out was held and the above information confirmed. The patient received intravenous antibiotics and had sequential compression devices applied to her lower extremities preoperatively. The patient was taken back to the operative suite where spinal anesthesia was administered. After induction of anesthesia, the patient was draped and prepped in the usual sterile manner and placed in a dorsal supine position with a leftward tilt. A low transverse skin incision was made with scalpel and carried down through the subcutaneous tissue to the fascia. Fascial incision was made and extended transversely. The fascia was separated from the underlying rectus tissue superiorly and inferiorly. The rectus muscles were separated in the midline bluntly and the peritoneum was entered bluntly. An Alexis retractor was placed to aid in visualization of the uterus. A bladder flap was not developed. A low transverse uterine incision was made. The infant was successfully delivered from cephalic presentation, the umbilical cord was clamped after 1 minute. Cord ph was not sent, and cord blood was obtained for evaluation. The placenta was removed Intact and appeared normal. The uterine incision was closed with a single layer running unlocked suture of 0-Monocryl. Due to ongoing bleeding from the hysterotomy figure of eight sutures of 0 Monocryl were placed after which there was excellent hemostasis. The abdomen and the pelvis were cleared of all clot and debris and the AUbaldo Glassingwas removed. Hemostasis was confirmed on all surfaces.  The peritoneum was reapproximated using 2-0 vicryl . The  fascia was then  closed using 0 Vicryl in a running fashion. The subcutaneous layer was reapproximated with 2-0 plain gut suture. The skin was closed with a 4-0 vicryl subcuticular stitch. The patient tolerated the procedure well. Sponge, lap, instrument and needle counts were correct x 2. She was taken to the recovery room in stable condition.  Disposition: PACU - hemodynamically stable.    Signed: Concepcion Living, MD [facility] Fellow  Center for [facility], [place]

## 2022-10-14 ENCOUNTER — Ambulatory Visit (INDEPENDENT_AMBULATORY_CARE_PROVIDER_SITE_OTHER): Payer: Medicaid Other | Admitting: Emergency Medicine

## 2022-10-14 NOTE — Progress Notes (Signed)
Patient presents for PP incision check.  Reports severe bilateral low back pain. 10/10 pain Reports constipation. 1 small bowel movement 10/03/22. Taking MiraLax and prescribed.   Per Constant, MD, patient should continue Miralax, and may take suppository or laxative. Pt should continue to Tylenol/Ibuprofen for incisional pain and back pain. Opiod may exacerbate constipation symptoms. Patient verbalized understanding, agrees with plan.  Incision is clean, dry, intact. No evidence of redness, heat, dehiscence, bleeding, drainage or odor.  Patient removed honeycomb dressing at home. This RN gave adhesive removal pads.

## 2022-11-01 DIAGNOSIS — Z419 Encounter for procedure for purposes other than remedying health state, unspecified: Secondary | ICD-10-CM | POA: Diagnosis not present

## 2022-11-21 ENCOUNTER — Encounter: Payer: Self-pay | Admitting: Obstetrics and Gynecology

## 2022-11-21 ENCOUNTER — Ambulatory Visit (INDEPENDENT_AMBULATORY_CARE_PROVIDER_SITE_OTHER): Payer: Medicaid Other | Admitting: Obstetrics and Gynecology

## 2022-11-21 MED ORDER — PROGESTERONE 200 MG PO CAPS: 200.0000 mg | ORAL_CAPSULE | Freq: Every day | ORAL | 0 refills | Status: DC

## 2022-11-21 NOTE — Progress Notes (Signed)
    [place] Partum Visit Note  Melanie Strong is a 37 y.o. [id number] female who presents for a postpartum visit. She is 6 weeks postpartum following a repeat cesarean section.  I have fully reviewed the prenatal and intrapartum course. The delivery was at 39.1 gestational weeks.  Anesthesia: spinal. Postpartum course has been uncomplicated. Baby is doing well. Baby is feeding by bottle - Enfamil AR. Bleeding  menses now, heavy . Bowel function is abnormal: constipation . Bladder function is normal. Patient is not sexually active. Contraception method is none. Postpartum depression screening: negative.  Cycle has been on now for 10 days  The pregnancy intention screening data noted above was reviewed. Potential methods of contraception were discussed. The patient elected to proceed with No data recorded.    Health Maintenance Due  Topic Date Due   COVID-19 Vaccine (1) Never done   DTaP/Tdap/Td (1 - Tdap) Never done    Medical Record  Review of Systems Pertinent items noted in HPI and remainder of comprehensive ROS otherwise negative.  Objective:  LMP 01/03/2022 (Exact Date)    General:  alert   Breasts:  not indicated  Lungs: clear to auscultation bilaterally  Heart:  regular rate and rhythm, S1, S2 normal, no murmur, click, rub or gallop  Abdomen: soft, non-tender; bowel sounds normal; no masses,  no organomegaly   Wound well approximated incision  GU exam:  not indicated       Assessment:    There are no diagnoses linked to this encounter.  NL postpartum exam.   Plan:   Essential components of care per ACOG recommendations:  1.  Mood and well being: Patient with negative depression screening today. Reviewed local resources for support.  - Patient tobacco use? No.   - hx of drug use? No.    2. Infant care and feeding:  -Patient currently breastmilk feeding? Yes -Social determinants of health (SDOH) reviewed in EPIC. No concerns  3. Sexuality, contraception and birth  spacing - Patient does not want a pregnancy in the next year.  Desired family size is uncertain.  - Reviewed reproductive life planning. Reviewed contraceptive methods based on pt preferences and effectiveness.  Patient desired Female Condom today.   - Discussed birth spacing of 18 months  4. Sleep and fatigue -Encouraged family/partner/community support of 4 hrs of uninterrupted sleep to help with mood and fatigue  5. Physical Recovery  - Discussed patients delivery and complications. She describes her labor as good. - Patient had a C-section.  - Patient has urinary incontinence? No. - Patient is safe to resume physical and sexual activity  6.  Health Maintenance - HM due items addressed Yes - Last pap smear  Diagnosis  Date Value Ref Range Status  02/01/2021   Final   - Negative for intraepithelial lesion or malignancy (NILM)   Pap smear not done at today's visit.  -Breast Cancer screening indicated? No.   7. Chronic Disease/Pregnancy Condition follow up: None  - PCP follow up  [facility].  Arlina Robes, MD Center for [place], [place]

## 2022-11-26 ENCOUNTER — Telehealth: Payer: Self-pay

## 2022-11-26 NOTE — Telephone Encounter (Signed)
Called patient to inform that return to work form has been completed and placed at the front desk for pick up.

## 2022-12-02 DIAGNOSIS — Z419 Encounter for procedure for purposes other than remedying health state, unspecified: Secondary | ICD-10-CM | POA: Diagnosis not present

## 2023-01-01 DIAGNOSIS — Z419 Encounter for procedure for purposes other than remedying health state, unspecified: Secondary | ICD-10-CM | POA: Diagnosis not present

## 2023-02-01 DIAGNOSIS — Z419 Encounter for procedure for purposes other than remedying health state, unspecified: Secondary | ICD-10-CM | POA: Diagnosis not present

## 2023-02-14 ENCOUNTER — Ambulatory Visit (INDEPENDENT_AMBULATORY_CARE_PROVIDER_SITE_OTHER): Payer: Medicaid Other

## 2023-02-14 VITALS — BP 120/73 | HR 77 | Wt 168.6 lb

## 2023-02-14 DIAGNOSIS — N912 Amenorrhea, unspecified: Secondary | ICD-10-CM | POA: Diagnosis not present

## 2023-02-14 DIAGNOSIS — Z3201 Encounter for pregnancy test, result positive: Secondary | ICD-10-CM

## 2023-02-14 DIAGNOSIS — O09529 Supervision of elderly multigravida, unspecified trimester: Secondary | ICD-10-CM

## 2023-02-14 DIAGNOSIS — N926 Irregular menstruation, unspecified: Secondary | ICD-10-CM

## 2023-02-14 DIAGNOSIS — O219 Vomiting of pregnancy, unspecified: Secondary | ICD-10-CM

## 2023-02-14 LAB — POCT URINE PREGNANCY: Preg Test, Ur: POSITIVE — AB

## 2023-02-14 MED ORDER — PRENATAL 27-0.8 MG PO TABS: 1.0000 | ORAL_TABLET | Freq: Every day | ORAL | 12 refills | Status: DC

## 2023-02-14 MED ORDER — DOXYLAMINE-PYRIDOXINE 10-10 MG PO TBEC: 2.0000 | DELAYED_RELEASE_TABLET | Freq: Every day | ORAL | 5 refills | Status: DC

## 2023-02-14 NOTE — Progress Notes (Signed)
Melanie Strong presents today for UPT. She reports N&V and requests PNV rx.   LMP: 11/11/22    OBJECTIVE: Appears well, in no apparent distress.  OB History     Gravida  4   Para  2   Term  2   Preterm      AB  1   Living  2      SAB  1   IAB      Ectopic      Multiple  0   Live Births  2          Home UPT Result: positive In-Office UPT result: positive  I have reviewed the patient's medical, obstetrical, social, and family histories, and medications.   ASSESSMENT: Positive pregnancy test  PLAN Prenatal care to be completed at:  [facility] at [facility] PNV's and [facility]

## 2023-03-03 DIAGNOSIS — Z419 Encounter for procedure for purposes other than remedying health state, unspecified: Secondary | ICD-10-CM | POA: Diagnosis not present

## 2023-03-17 ENCOUNTER — Other Ambulatory Visit: Payer: Medicaid Other

## 2023-03-17 ENCOUNTER — Other Ambulatory Visit (HOSPITAL_COMMUNITY)
Admission: RE | Admit: 2023-03-17 | Discharge: 2023-03-17 | Disposition: A | Discharge status: Home / Self Care | Payer: Medicaid Other | Source: Ambulatory Visit | Attending: Obstetrics & Gynecology | Admitting: Obstetrics & Gynecology

## 2023-03-17 ENCOUNTER — Other Ambulatory Visit (INDEPENDENT_AMBULATORY_CARE_PROVIDER_SITE_OTHER): Payer: Medicaid Other

## 2023-03-17 ENCOUNTER — Ambulatory Visit (INDEPENDENT_AMBULATORY_CARE_PROVIDER_SITE_OTHER): Payer: Medicaid Other | Admitting: *Deleted

## 2023-03-17 VITALS — BP 109/68 | HR 65 | Wt 166.8 lb

## 2023-03-17 DIAGNOSIS — O0991 Supervision of high risk pregnancy, unspecified, first trimester: Secondary | ICD-10-CM

## 2023-03-17 DIAGNOSIS — Z3A09 9 weeks gestation of pregnancy: Secondary | ICD-10-CM | POA: Diagnosis not present

## 2023-03-17 DIAGNOSIS — Z1339 Encounter for screening examination for other mental health and behavioral disorders: Secondary | ICD-10-CM | POA: Diagnosis not present

## 2023-03-17 DIAGNOSIS — O0990 Supervision of high risk pregnancy, unspecified, unspecified trimester: Secondary | ICD-10-CM | POA: Insufficient documentation

## 2023-03-17 DIAGNOSIS — O3680X1 Pregnancy with inconclusive fetal viability, fetus 1: Secondary | ICD-10-CM

## 2023-03-17 NOTE — Progress Notes (Signed)
New OB Intake  I connected with Melanie Strong  on 03/17/23 at  8:15 AM EDT by In Person Visit and verified that I am speaking with the correct person using two identifiers. Nurse is located at [place] and pt is located at [place].  I discussed the limitations, risks, security and privacy concerns of performing an evaluation and management service by telephone and the availability of in person appointments. I also discussed with the patient that there may be a patient responsible charge related to this service. The patient expressed understanding and agreed to proceed.  I explained I am completing New OB Intake today. We discussed EDD of Not found.. Pt is [id number]. I reviewed her allergies, medications and Medical/Surgical/OB history.    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 Discussed the nature of our practice with multiple providers including residents and students. Due to the size of the practice, the delivering provider may not be the same as those providing prenatal care.   Patient  is not a candidate for water birth. Offered upcoming OB visit with CNM to discuss further.  MyChart/Babyscripts MyChart access verified. I explained pt will have some visits in office and some virtually. Babyscripts instructions given and order placed. Patient verifies receipt of registration text/e-mail. Account successfully created and app downloaded.  Blood Pressure Cuff/Weight Scale Pt has BP cuff  at home. Explained after first prenatal appt pt will check weekly and document in Babyscripts. Patient does not have weight scale; patient may purchase if they desire to track weight weekly in Babyscripts.  Anatomy US Explained first scheduled [place] will be around 19 weeks. Anatomy US scheduled for TBD at TBD.  Interested in [place]? If yes, send referral and doula dot phrase.    First visit review I reviewed new OB appt with patient. Explained pt will be seen by Camelia Eng, CNM at first visit. Discussed Avelina Laine genetic screening with patient. Requests Panorama ([place] done last preg). Routine prenatal labs  scheduled for collection 03/24/23.    Last Pap Diagnosis  Date Value Ref Range Status  02/01/2021   Final   - Negative for intraepithelial lesion or malignancy (NILM)    Harrel Lemon, RN 03/17/2023  8:27 AM

## 2023-03-17 NOTE — Patient Instructions (Signed)

## 2023-03-18 LAB — CERVICOVAGINAL ANCILLARY ONLY
Chlamydia: NEGATIVE
Comment: NEGATIVE
Comment: NORMAL
Neisseria Gonorrhea: NEGATIVE

## 2023-03-19 LAB — URINE CULTURE, OB REFLEX

## 2023-03-19 LAB — CULTURE, OB URINE

## 2023-03-24 ENCOUNTER — Other Ambulatory Visit: Payer: Medicaid Other

## 2023-03-24 DIAGNOSIS — O0990 Supervision of high risk pregnancy, unspecified, unspecified trimester: Secondary | ICD-10-CM | POA: Diagnosis not present

## 2023-03-25 ENCOUNTER — Encounter: Payer: Self-pay | Admitting: Obstetrics

## 2023-03-25 LAB — CBC/D/PLT+RPR+RH+ABO+RUBIGG...
Antibody Screen: NEGATIVE
Basophils Absolute: 0 10*3/uL (ref 0.0–0.2)
Basos: 1 %
EOS (ABSOLUTE): 0.1 10*3/uL (ref 0.0–0.4)
Eos: 1 %
HCV Ab: NONREACTIVE
HIV Screen 4th Generation wRfx: NONREACTIVE
Hematocrit: 33.7 % — ABNORMAL LOW (ref 34.0–46.6)
Hemoglobin: 10.9 g/dL — ABNORMAL LOW (ref 11.1–15.9)
Hepatitis B Surface Ag: NEGATIVE
Immature Grans (Abs): 0 10*3/uL (ref 0.0–0.1)
Immature Granulocytes: 0 %
Lymphocytes Absolute: 1.5 10*3/uL (ref 0.7–3.1)
Lymphs: 28 %
MCH: 25.1 pg — ABNORMAL LOW (ref 26.6–33.0)
MCHC: 32.3 g/dL (ref 31.5–35.7)
MCV: 78 fL — ABNORMAL LOW (ref 79–97)
Monocytes Absolute: 0.4 10*3/uL (ref 0.1–0.9)
Monocytes: 8 %
Neutrophils Absolute: 3.5 10*3/uL (ref 1.4–7.0)
Neutrophils: 62 %
Platelets: 272 10*3/uL (ref 150–450)
RBC: 4.34 x10E6/uL (ref 3.77–5.28)
RDW: 13.5 % (ref 11.7–15.4)
RPR Ser Ql: NONREACTIVE
Rh Factor: POSITIVE
Rubella Antibodies, IGG: 1.1 index (ref >0.99)
WBC: 5.6 10*3/uL (ref 3.4–10.8)

## 2023-03-25 LAB — COMPREHENSIVE METABOLIC PANEL
ALT: 10 IU/L (ref 0–32)
AST: 13 IU/L (ref 0–40)
Albumin: 4.3 g/dL (ref 3.9–4.9)
Alkaline Phosphatase: 78 IU/L (ref 44–121)
BUN/Creatinine Ratio: 14 (ref 9–23)
BUN: 8 mg/dL (ref 6–20)
Bilirubin Total: 0.5 mg/dL (ref 0.0–1.2)
CO2: 20 mmol/L (ref 20–29)
Calcium: 9.9 mg/dL (ref 8.7–10.2)
Chloride: 103 mmol/L (ref 96–106)
Creatinine, Ser: 0.56 mg/dL — ABNORMAL LOW (ref 0.57–1.00)
Globulin, Total: 2.7 g/dL (ref 1.5–4.5)
Glucose: 83 mg/dL (ref 70–99)
Potassium: 4.2 mmol/L (ref 3.5–5.2)
Sodium: 138 mmol/L (ref 134–144)
Total Protein: 7 g/dL (ref 6.0–8.5)
eGFR: 121 mL/min/{1.73_m2} (ref >59)

## 2023-03-25 LAB — HCV INTERPRETATION

## 2023-03-31 ENCOUNTER — Encounter: Payer: Self-pay | Admitting: Obstetrics

## 2023-04-03 DIAGNOSIS — Z419 Encounter for procedure for purposes other than remedying health state, unspecified: Secondary | ICD-10-CM | POA: Diagnosis not present

## 2023-04-16 ENCOUNTER — Encounter: Payer: Self-pay | Admitting: Obstetrics

## 2023-04-16 ENCOUNTER — Other Ambulatory Visit: Payer: Self-pay

## 2023-04-16 ENCOUNTER — Ambulatory Visit (INDEPENDENT_AMBULATORY_CARE_PROVIDER_SITE_OTHER): Payer: Medicaid Other

## 2023-04-16 VITALS — BP 109/61 | HR 74 | Wt 167.6 lb

## 2023-04-16 DIAGNOSIS — Z98891 History of uterine scar from previous surgery: Secondary | ICD-10-CM

## 2023-04-16 DIAGNOSIS — Z3A13 13 weeks gestation of pregnancy: Secondary | ICD-10-CM

## 2023-04-16 DIAGNOSIS — O09891 Supervision of other high risk pregnancies, first trimester: Secondary | ICD-10-CM

## 2023-04-16 DIAGNOSIS — Z148 Genetic carrier of other disease: Secondary | ICD-10-CM

## 2023-04-16 DIAGNOSIS — D563 Thalassemia minor: Secondary | ICD-10-CM

## 2023-04-16 DIAGNOSIS — O09299 Supervision of pregnancy with other poor reproductive or obstetric history, unspecified trimester: Secondary | ICD-10-CM

## 2023-04-16 DIAGNOSIS — Z8619 Personal history of other infectious and parasitic diseases: Secondary | ICD-10-CM

## 2023-04-16 DIAGNOSIS — O09521 Supervision of elderly multigravida, first trimester: Secondary | ICD-10-CM

## 2023-04-16 MED ORDER — ASPIRIN 81 MG PO TBEC: 81.0000 mg | DELAYED_RELEASE_TABLET | Freq: Every day | ORAL | 2 refills | Status: DC

## 2023-04-16 NOTE — Progress Notes (Signed)
Pt. Presents with headaches and back pain. Tylenol does not help.

## 2023-04-16 NOTE — Progress Notes (Signed)
Subjective:   Melanie Strong is a 37 y.o. [id number] at [redacted]w[redacted]d by LMP being seen today for her first obstetrical visit.  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  Pregnancy history fully reviewed.  Patient reports headache, nausea, and decreased appetite .  HISTORY: OB History  Gravida Para Term Preterm AB Living  4 2 2  0 1 2  SAB IAB Ectopic Multiple Live Births  1 0 0 0 2    # Outcome Date GA Lbr Len/2nd Weight Sex Type Anes PTL Lv  4 Current           3 Term 10/04/22 [redacted]w[redacted]d  3770 g M CS-LTranv Spinal  LIV     Name: [facility], [facility]     Apgar1: 8  Apgar5: 9  2 Term 08/23/21 [redacted]w[redacted]d  3940 g F CS-LTranv EPI  LIV     Name: Tiffany Kocher     Apgar1: 1  Apgar5: 8  1 SAB 06/29/20 [redacted]w[redacted]d    SAB      Past Medical History:  Diagnosis Date   Anemia    Genital herpes 07/11/2021   Glucose found in urine on examination    History of ileus    Infection    UTI   Pregnancy induced hypertension    Past Surgical History:  Procedure Laterality Date   CESAREAN SECTION  08/23/2021   Procedure: CESAREAN SECTION;  Surgeon: [name], MD;  Location: [facility] LD ORS;  Service: Obstetrics;;   CESAREAN SECTION N/A 10/04/2022   Procedure: CESAREAN SECTION;  Surgeon: Adam Phenix, MD;  Location: [facility] LD ORS;  Service: Obstetrics;  Laterality: N/A;   Family History  Problem Relation Age of Onset   Hypertension Mother    Diabetes Mother        Boarderline   Lupus Mother        2nd opinion says no/ unsure   Cancer Father        rectal   Sickle cell anemia Sister    Healthy Brother     Healthy Brother    Cancer Paternal Aunt        brain   Cancer Paternal Uncle        stomach   Diabetes Maternal Grandmother    Other Neg Hx    Asthma Neg Hx    Heart disease Neg Hx    Stroke Neg Hx    Social History   Tobacco Use   Smoking status: Former    Current packs/day: 0.25    Average packs/day: 0.3 packs/day for 10.0 years (2.5 ttl pk-yrs)    Types: Cigarettes    Passive exposure: Never   Smokeless tobacco: Never  Vaping Use   Vaping status: Never Used  Substance Use Topics   Alcohol use: No    Comment: rare   Drug use: No   No Known Allergies Current Outpatient Medications on File Prior to Visit  Medication Sig Dispense Refill   Doxylamine-Pyridoxine (DICLEGIS) 10-10 MG TBEC Take 2 tablets by mouth at bedtime. If symptoms persist, add one tablet in the morning and one in the afternoon 100 tablet 5   Prenatal Vit-Fe Fumarate-FA (MULTIVITAMIN-PRENATAL) 27-0.8  MG TABS tablet Take 1 tablet by mouth daily before breakfast. 30 tablet 12   polyethylene glycol powder (GLYCOLAX/MIRALAX) 17 GM/SCOOP powder Take 17 g by mouth 2 (two) times daily as needed for moderate constipation. (Patient not taking: Reported on 02/14/2023) 500 g 2   progesterone (PROMETRIUM) 200 MG capsule Take 1 capsule (200 mg total) by mouth daily. (Patient not taking: Reported on 02/14/2023) 10 capsule 0   No current facility-administered medications on file prior to visit.     Indications for ASA therapy (per uptodate) One of the following: Previous pregnancy with preeclampsia, especially early onset and with an adverse outcome Yes Multifetal gestation No Chronic hypertension No Type 1 or 2 diabetes mellitus No Chronic kidney disease No Autoimmune disease (antiphospholipid syndrome, systemic lupus erythematosus) No   Two or more of the following: Nulliparity No Obesity (body mass index >30 kg/m2) No Family history of preeclampsia in mother or sister No Age ?35 years Yes Sociodemographic  characteristics (African American race, low socioeconomic level) Yes Personal risk factors (eg, previous pregnancy with low birth weight or small for gestational age infant, previous adverse pregnancy outcome [eg, stillbirth], interval >10 years between pregnancies) No   Exam   Vitals:   04/16/23 0926  BP: 109/61  Pulse: 74  Weight: 76 kg   Fetal Heart Rate (bpm): 134  Uterus:     Pelvic Exam: Perineum: no hemorrhoids, normal perineum   Vulva: normal external genitalia, no lesions   Vagina:  normal mucosa, normal discharge   Cervix: no lesions and normal, pap smear done.    Adnexa: normal adnexa and no mass, fullness, tenderness   Bony Pelvis: average  System: General: well-developed, well-nourished female in no acute distress   Breast:  normal appearance, no masses or tenderness   Skin: normal coloration and turgor, no rashes   Neurologic: oriented, normal, negative, normal mood   Extremities: normal strength, tone, and muscle mass, ROM of all joints is normal   HEENT PERRLA, extraocular movement intact and sclera clear, anicteric   Mouth/Teeth mucous membranes moist, pharynx normal without lesions and dental hygiene good   Neck supple and no masses   Cardiovascular: regular rate and rhythm   Respiratory:  no respiratory distress, normal breath sounds   Abdomen: soft, non-tender; bowel sounds normal; no masses,  no organomegaly     Assessment:   Pregnancy: [id number] Patient Active Problem List   Diagnosis Date Noted   Supervision of high risk pregnancy, antepartum 03/17/2023   Postpartum care following cesarean delivery 11/21/2022   History of cesarean section 06/19/2022   History of ELISA positive for HSV 06/19/2022   Multigravida of advanced maternal age in second trimester 04/24/2022   Hx of preeclampsia, prior pregnancy, currently pregnant 03/20/2022   Alpha thalassemia silent carrier 02/13/2021   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 Supervision of high-risk pregnancy of elderly multigravida, first trimester - concerns today about headaches not resolved with Tylenol, and decreased appetite; discussed increasing calories/protein and trying to eat small frequent meal/snacks throughout the day, and drinking more water - Recommended Tylenol or Excedrin Tension, as well as OTC Magnesium 400mg /day   2. Hx of preeclampsia, prior pregnancy, currently pregnant - new Rx today for baby ASA; discussed starting today and continuing daily throughout pregnancy  3. Short interval between pregnancies affecting pregnancy in first trimester, antepartum  4. History of cesarean section x 2  - is interested in  discussing TOLAC   5. History of ELISA positive for HSV - discussed being treated if any outbreaks occur in pregnancy, and starting suppressive therapy at 36 weeks  6. Alpha thalassemia silent carrier  7. 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 Carrier of spinal muscular atrophy  Initial labs already drawn as well as NIPS. Continue prenatal vitamins. Start baby ASA. Fetal anatomic survey: ordered previously; already scheduled. Problem list reviewed and updated. The nature of [facility] with multiple MDs and other Advanced Practice Providers was explained to patient; also emphasized that residents, students are part of our team. Routine obstetric precautions reviewed. Anticipatory guidance about next visits/weeks of pregnancy given.  Return in about 4 weeks (around 05/14/2023) for ROB.   Karis Juba, Student-MidWife 04/16/23 10:24 AM

## 2023-05-01 ENCOUNTER — Encounter: Payer: Medicaid Other | Admitting: Obstetrics and Gynecology

## 2023-05-04 DIAGNOSIS — Z419 Encounter for procedure for purposes other than remedying health state, unspecified: Secondary | ICD-10-CM | POA: Diagnosis not present

## 2023-05-14 ENCOUNTER — Encounter: Payer: Self-pay | Admitting: Obstetrics and Gynecology

## 2023-05-14 ENCOUNTER — Ambulatory Visit (INDEPENDENT_AMBULATORY_CARE_PROVIDER_SITE_OTHER): Payer: Medicaid Other | Admitting: Obstetrics and Gynecology

## 2023-05-14 VITALS — BP 130/66 | HR 83 | Wt 172.8 lb

## 2023-05-14 DIAGNOSIS — Z8619 Personal history of other infectious and parasitic diseases: Secondary | ICD-10-CM

## 2023-05-14 DIAGNOSIS — O09892 Supervision of other high risk pregnancies, second trimester: Secondary | ICD-10-CM

## 2023-05-14 DIAGNOSIS — O09299 Supervision of pregnancy with other poor reproductive or obstetric history, unspecified trimester: Secondary | ICD-10-CM

## 2023-05-14 DIAGNOSIS — Z98891 History of uterine scar from previous surgery: Secondary | ICD-10-CM | POA: Diagnosis not present

## 2023-05-14 DIAGNOSIS — O09522 Supervision of elderly multigravida, second trimester: Secondary | ICD-10-CM | POA: Diagnosis not present

## 2023-05-14 DIAGNOSIS — Z3A17 17 weeks gestation of pregnancy: Secondary | ICD-10-CM | POA: Diagnosis not present

## 2023-05-14 DIAGNOSIS — O09521 Supervision of elderly multigravida, first trimester: Secondary | ICD-10-CM

## 2023-05-14 DIAGNOSIS — O09891 Supervision of other high risk pregnancies, first trimester: Secondary | ICD-10-CM

## 2023-05-14 DIAGNOSIS — O09292 Supervision of pregnancy with other poor reproductive or obstetric history, second trimester: Secondary | ICD-10-CM

## 2023-05-14 NOTE — Progress Notes (Signed)
   PRENATAL VISIT NOTE  Subjective:  Melanie Strong is a 37 y.o. [id number] at [redacted]w[redacted]d being seen today for ongoing prenatal care.  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  Patient reports  dizzy and light headed. Reports no appetite, maybe one meal a day. Drinking ensure/protein shake .  Contractions: Not present. Vag. Bleeding: None.  Movement: Present. Denies leaking of fluid.   The following portions of the patient's history were reviewed and updated as appropriate: allergies, current medications, past family history, past medical history, past social history, past surgical history and problem list.   Objective:   Vitals:   05/14/23 0932  BP: 130/66  Pulse: 83  Weight: 172 lb 12.8 oz (78.4 kg)    Fetal Status: Fetal Heart Rate (bpm): 136   Movement: Present     General:  Alert, oriented and cooperative. Patient is in no acute distress.  Skin: Skin is warm and dry. No rash noted.   Cardiovascular: Normal heart rate noted  Respiratory: Normal respiratory effort, no problems with respiration noted  Abdomen: Soft, gravid, appropriate for gestational age.  Pain/Pressure: Present     Pelvic: Cervical exam deferred        Extremities: Normal range of motion.  Edema: Trace  Mental Status: Normal mood and affect. Normal behavior. Normal judgment and thought content.   Assessment and Plan:  Pregnancy: [id number] at 108w4d 1. Supervision of high-risk pregnancy of elderly multigravida, first trimester BP and FHR normal Discussed dizziness and lightheadedness, likely d/t diet. Encouraged small frequent  protein heavy snacks/meals. Increase hydration. Discussed results of cbc, hgb slightly low, discussed oral iron every other or every couple of days.  Strict precautions discussed when to follow up or return to hospital   2. Hx of preeclampsia, prior pregnancy, currently pregnant Normotensive today, on ASA, baseline protein creatinine   3. History of cesarean section Interested in TOLAC  4. Short interval between pregnancies affecting pregnancy in first trimester, antepartum   5. History of ELISA positive for HSV Suppression at 36 weeks   Preterm labor symptoms and general obstetric precautions including but not limited to vaginal bleeding, contractions, leaking of fluid and fetal movement were reviewed in detail with the patient. Please refer to After Visit Summary for other counseling recommendations.   Return in 4 weeks for routine prenatal  Future Appointments  Date Time Provider Department Center  05/26/2023  9:15 AM [facility] NURSE [facility]  05/26/2023  9:30 AM [facility] US1 [facility]  06/10/2023  9:35 AM Adam Phenix, MD [facility] None    Albertine Grates, FNP

## 2023-05-14 NOTE — Progress Notes (Signed)
Pt. Presents for ROB. Pt. States that she feels dizzy and weak at times.

## 2023-05-15 LAB — PROTEIN / CREATININE RATIO, URINE
Creatinine, Urine: 123.7 mg/dL
Protein, Ur: 11.2 mg/dL
Protein/Creat Ratio: 91 mg/g{creat} (ref 0–200)

## 2023-05-16 LAB — AFP, SERUM, OPEN SPINA BIFIDA
AFP MoM: 1.05
AFP Value: 40.1 ng/mL
Gest. Age on Collection Date: 17 wk
Maternal Age At EDD: 37 a
OSBR Risk 1 IN: 10000
Test Results:: NEGATIVE
Weight: 172 [lb_av]

## 2023-05-26 ENCOUNTER — Other Ambulatory Visit: Payer: Self-pay | Admitting: *Deleted

## 2023-05-26 ENCOUNTER — Ambulatory Visit: Payer: Medicaid Other

## 2023-05-26 ENCOUNTER — Ambulatory Visit: Payer: Medicaid Other | Attending: Obstetrics & Gynecology

## 2023-05-26 DIAGNOSIS — O99013 Anemia complicating pregnancy, third trimester: Secondary | ICD-10-CM | POA: Diagnosis not present

## 2023-05-26 DIAGNOSIS — O09522 Supervision of elderly multigravida, second trimester: Secondary | ICD-10-CM

## 2023-05-26 DIAGNOSIS — O0990 Supervision of high risk pregnancy, unspecified, unspecified trimester: Secondary | ICD-10-CM

## 2023-05-26 DIAGNOSIS — D563 Thalassemia minor: Secondary | ICD-10-CM

## 2023-05-26 DIAGNOSIS — Z3A19 19 weeks gestation of pregnancy: Secondary | ICD-10-CM | POA: Diagnosis not present

## 2023-05-26 DIAGNOSIS — O34219 Maternal care for unspecified type scar from previous cesarean delivery: Secondary | ICD-10-CM | POA: Diagnosis not present

## 2023-06-02 ENCOUNTER — Inpatient Hospital Stay (HOSPITAL_COMMUNITY)
Admission: AD | Admit: 2023-06-02 | Discharge: 2023-06-02 | Disposition: A | Discharge status: Home / Self Care | Payer: Medicaid Other | Attending: Obstetrics & Gynecology | Admitting: Obstetrics & Gynecology

## 2023-06-02 ENCOUNTER — Encounter (HOSPITAL_COMMUNITY): Payer: Self-pay | Admitting: Obstetrics & Gynecology

## 2023-06-02 DIAGNOSIS — O26892 Other specified pregnancy related conditions, second trimester: Secondary | ICD-10-CM | POA: Insufficient documentation

## 2023-06-02 DIAGNOSIS — R109 Unspecified abdominal pain: Secondary | ICD-10-CM | POA: Diagnosis not present

## 2023-06-02 DIAGNOSIS — R42 Dizziness and giddiness: Secondary | ICD-10-CM | POA: Insufficient documentation

## 2023-06-02 DIAGNOSIS — O09522 Supervision of elderly multigravida, second trimester: Secondary | ICD-10-CM | POA: Insufficient documentation

## 2023-06-02 DIAGNOSIS — Z3A20 20 weeks gestation of pregnancy: Secondary | ICD-10-CM | POA: Diagnosis not present

## 2023-06-02 LAB — COMPREHENSIVE METABOLIC PANEL
ALT: 9 U/L (ref 0–44)
AST: 11 U/L — ABNORMAL LOW (ref 15–41)
Albumin: 3.4 g/dL — ABNORMAL LOW (ref 3.5–5.0)
Alkaline Phosphatase: 67 U/L (ref 38–126)
Anion gap: 9 (ref 5–15)
BUN: <5 mg/dL — ABNORMAL LOW (ref 6–20)
CO2: 20 mmol/L — ABNORMAL LOW (ref 22–32)
Calcium: 9.1 mg/dL (ref 8.9–10.3)
Chloride: 106 mmol/L (ref 98–111)
Creatinine, Ser: 0.57 mg/dL (ref 0.44–1.00)
GFR, Estimated: >60 mL/min (ref >60)
Glucose, Bld: 88 mg/dL (ref 70–99)
Potassium: 3.6 mmol/L (ref 3.5–5.1)
Sodium: 135 mmol/L (ref 135–145)
Total Bilirubin: 0.5 mg/dL (ref 0.3–1.2)
Total Protein: 6.8 g/dL (ref 6.5–8.1)

## 2023-06-02 LAB — URINALYSIS, ROUTINE W REFLEX MICROSCOPIC
Bilirubin Urine: NEGATIVE
Glucose, UA: NEGATIVE mg/dL
Hgb urine dipstick: NEGATIVE
Ketones, ur: NEGATIVE mg/dL
Leukocytes,Ua: NEGATIVE
Nitrite: NEGATIVE
Protein, ur: NEGATIVE mg/dL
Specific Gravity, Urine: 1.003 — ABNORMAL LOW (ref 1.005–1.030)
pH: 7 (ref 5.0–8.0)

## 2023-06-02 LAB — CBC
HCT: 33.7 % — ABNORMAL LOW (ref 36.0–46.0)
Hemoglobin: 10.7 g/dL — ABNORMAL LOW (ref 12.0–15.0)
MCH: 25.5 pg — ABNORMAL LOW (ref 26.0–34.0)
MCHC: 31.8 g/dL (ref 30.0–36.0)
MCV: 80.2 fL (ref 80.0–100.0)
Platelets: 222 10*3/uL (ref 150–400)
RBC: 4.2 MIL/uL (ref 3.87–5.11)
RDW: 16.1 % — ABNORMAL HIGH (ref 11.5–15.5)
WBC: 8.1 10*3/uL (ref 4.0–10.5)
nRBC: 0 % (ref 0.0–0.2)

## 2023-06-02 NOTE — MAU Note (Signed)
Pt stated she needed to leave her kids are in the car with a family member and they need to go. Informed Dr. Macon Large and she will call pt with results. D/c instructions given.

## 2023-06-02 NOTE — MAU Note (Signed)
Melanie Strong is a 37 y.o. at [redacted]w[redacted]d here in MAU reporting: hurting and feels dizzy.  Got up this morning, went to the bathroom.  After she went, she just started hurting.  Can't describe it. Felt dizzy, so she checked her blood pressure. 113/76 .  Is hurting while sitting, but worse when she walks. No bleeding or LOF.  Denies pain with urination. Denies diarrhea or constipaiton.  Onset of complaint: 0730 Pain score: 7 Vitals:   06/02/23 1254  BP: (!) 115/59  Pulse: 85  Resp: 17  Temp: 98.4 F (36.9 C)  SpO2: 99%     FHT:155 Lab orders placed from triage:  urine

## 2023-06-02 NOTE — MAU Provider Note (Signed)
Obstetric Attending MAU Note  Chief Complaint:  Abdominal Pain   Event Date/Time   First Provider Initiated Contact with Patient 06/02/23 1321     HPI: Melanie Strong is a 37 y.o. [id number] at [redacted]w[redacted]d who presents to maternity admissions reporting abdominal pain and feeling dizzy.  Pain is in the middle of her abdomen, but she is worried about contractions.  Denies leakage of fluid or vaginal bleeding. Some fetal movement.   Denies any abnormal vaginal discharge, fevers, chills, sweats, dysuria, nausea, vomiting, other GI or GU symptoms or other general symptoms.  No sick contacts.    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 02/13/2021   Carrier of spinal muscular atrophy 02/13/2021    Past Medical History:  Diagnosis Date   Anemia    Genital herpes 07/11/2021   Glucose found in urine on examination    History of ileus    Infection    UTI   Pregnancy induced hypertension     OB History  Gravida Para Term Preterm AB Living  4 2 2   1 2   SAB IAB Ectopic Multiple Live Births  1 0 0 0 2    # Outcome Date GA Lbr Len/2nd Weight Sex Type Anes PTL Lv  4 Current           3 Term 10/04/22 [redacted]w[redacted]d  8 lb 5 oz (3.77 kg) M CS-LTranv Spinal  LIV  2 Term 08/23/21 [redacted]w[redacted]d  8 lb 11 oz (3.94 kg) F CS-LTranv EPI  LIV  1 SAB 06/29/20 [redacted]w[redacted]d    SAB       Past Surgical History:  Procedure Laterality Date   CESAREAN SECTION  08/23/2021   Procedure: CESAREAN SECTION;  Surgeon: [name], MD;  Location: [facility] LD [facility]S;  Service: Obstetrics;;   CESAREAN SECTION N/A 10/04/2022    Procedure: CESAREAN SECTION;  Surgeon: Adam Phenix, MD;  Location: [facility] LD [facility]S;  Service: Obstetrics;  Laterality: N/A;    Family History: Family History  Problem Relation Age of Onset   Hypertension Mother    Diabetes Mother        Boarderline   Lupus Mother        2nd opinion says no/ unsure   Cancer Father        rectal   Sickle cell anemia Sister    Healthy Brother    Healthy Brother    Cancer Paternal Aunt        brain   Cancer Paternal Uncle        stomach   Diabetes Maternal Grandmother    Other Neg Hx    Asthma Neg Hx    Heart disease Neg Hx    Stroke Neg Hx     Social History: Social History   Tobacco Use   Smoking status: Former    Current packs/day: 0.25    Average packs/day: 0.3 packs/day for 10.0 years (2.5 ttl pk-yrs)    Types: Cigarettes    Passive exposure: Never   Smokeless tobacco: Never  Vaping Use   Vaping status: Never Used  Substance Use Topics  Alcohol use: No    Comment: rare   Drug use: No    Allergies: No Known Allergies  No medications prior to admission.    ROS: Pertinent findings in history of present illness.  Physical Exam  Blood pressure (!) 115/59, pulse 85, temperature 98.4 F (36.9 C), temperature source Oral, resp. rate 17, height 5\' 5"  (1.651 m), weight 174 lb 8 oz (79.2 kg), last menstrual period 01/11/2023, SpO2 99%, currently breastfeeding. FHR 155 bpm CONSTITUTIONAL: Well-developed, well-nourished female in no acute distress.  HENT:  Normocephalic, atraumatic, External right and left ear normal. Oropharynx is clear and moist EYES: Conjunctivae and EOM are normal. Pupils are equal, round, and reactive to light. No scleral icterus.  NECK: Normal range of motion, supple, no masses SKIN: Skin is warm and dry. No rash noted. Not diaphoretic. No erythema. No pallor. NEUROLGIC: Alert and oriented to person, place, and time. Normal reflexes, muscle tone coordination. No cranial nerve deficit noted. PSYCHIATRIC: Normal  mood and affect. Normal behavior. Normal judgment and thought content. CARDIOVASCULAR: Normal heart rate noted, regular rhythm RESPIRATORY: Effort and breath sounds normal, no problems with respiration noted ABDOMEN: Soft,  mild tenderness in mid fundal area, nondistended, gravid appropriate for gestational age MUSCULOSKELETAL: Normal range of motion. No edema and no tenderness. 2+ distal pulses.  SPECULUM EXAM: NEFG, physiologic discharge, no blood, cervix clean Dilation: Closed Effacement (%): Thick Cervical Position: Posterior Exam by:: Dr. Macon Large    Labs: Results for orders placed or performed during the hospital encounter of 06/02/23 (from the past 24 hour(s))  Urinalysis, Routine w reflex microscopic -Urine, Clean Catch     Status: Abnormal   Collection Time: 06/02/23 12:59 PM  Result Value Ref Range   Color, Urine STRAW (A) YELLOW   APPearance CLEAR CLEAR   Specific Gravity, Urine 1.003 (L) 1.005 - 1.030   pH 7.0 5.0 - 8.0   Glucose, UA NEGATIVE NEGATIVE mg/dL   Hgb urine dipstick NEGATIVE NEGATIVE   Bilirubin Urine NEGATIVE NEGATIVE   Ketones, ur NEGATIVE NEGATIVE mg/dL   Protein, ur NEGATIVE NEGATIVE mg/dL   Nitrite NEGATIVE NEGATIVE   Leukocytes,Ua NEGATIVE NEGATIVE  CBC     Status: Abnormal   Collection Time: 06/02/23  2:03 PM  Result Value Ref Range   WBC 8.1 4.0 - 10.5 K/uL   RBC 4.20 3.87 - 5.11 MIL/uL   Hemoglobin 10.7 (L) 12.0 - 15.0 g/dL   HCT 29.5 (L) 62.1 - 30.8 %   MCV 80.2 80.0 - 100.0 fL   MCH 25.5 (L) 26.0 - 34.0 pg   MCHC 31.8 30.0 - 36.0 g/dL   RDW 65.7 (H) 84.6 - 96.2 %   Platelets 222 150 - 400 K/uL   nRBC 0.0 0.0 - 0.2 %  Comprehensive metabolic panel     Status: Abnormal   Collection Time: 06/02/23  2:03 PM  Result Value Ref Range   Sodium 135 135 - 145 mmol/L   Potassium 3.6 3.5 - 5.1 mmol/L   Chloride 106 98 - 111 mmol/L   CO2 20 (L) 22 - 32 mmol/L   Glucose, Bld 88 70 - 99 mg/dL   BUN <5 (L) 6 - 20 mg/dL   Creatinine, Ser 9.52 0.44 -  1.00 mg/dL   Calcium 9.1 8.9 - 84.1 mg/dL   Total Protein 6.8 6.5 - 8.1 g/dL   Albumin 3.4 (L) 3.5 - 5.0 g/dL   AST 11 (L) 15 - 41 U/L   ALT 9 0 - 44 U/L  Alkaline Phosphatase 67 38 - 126 U/L   Total Bilirubin 0.5 0.3 - 1.2 mg/dL   GFR, Estimated >40 >98 mL/min   Anion gap 9 5 - 15    MAU Course: UA negative CBC, CMP not concerning.  Assessment: 1. Abdominal pain during pregnancy in second trimester   2. [redacted] weeks gestation of pregnancy     Plan: No evidence of UTI, labs reassuring.   Recommended adequate hydration. Preterm labor and routine obstetric precautions reviewed.  Follow up with OB provider Discharged to home in stable condition.    Allergies as of 06/02/2023   No Known Allergies      Medication List     STOP taking these medications    progesterone 200 MG capsule Commonly known as: Prometrium       TAKE these medications    aspirin EC 81 MG tablet Take 1 tablet (81 mg total) by mouth daily. Take after 12 weeks for prevention of preeclampsia later in pregnancy   Doxylamine-Pyridoxine 10-10 MG Tbec Commonly known as: Diclegis Take 2 tablets by mouth at bedtime. If symptoms persist, add one tablet in the morning and one in the afternoon   multivitamin-prenatal 27-0.8 MG Tabs tablet Take 1 tablet by mouth daily before breakfast.   polyethylene glycol powder 17 GM/SCOOP powder Commonly known as: GLYCOLAX/MIRALAX Take 17 g by mouth 2 (two) times daily as needed for moderate constipation.        Tereso Newcomer, MD 06/02/2023 3:05 PM

## 2023-06-03 DIAGNOSIS — Z419 Encounter for procedure for purposes other than remedying health state, unspecified: Secondary | ICD-10-CM | POA: Diagnosis not present

## 2023-06-07 IMAGING — CR DG ABDOMEN 2V
3 series · 3 of 3 positions shown · non-contrast
Comparison: None.

CLINICAL DATA: Distended abdomen and pain

EXAM:
ABDOMEN - 2 VIEW

[abdomen erect]
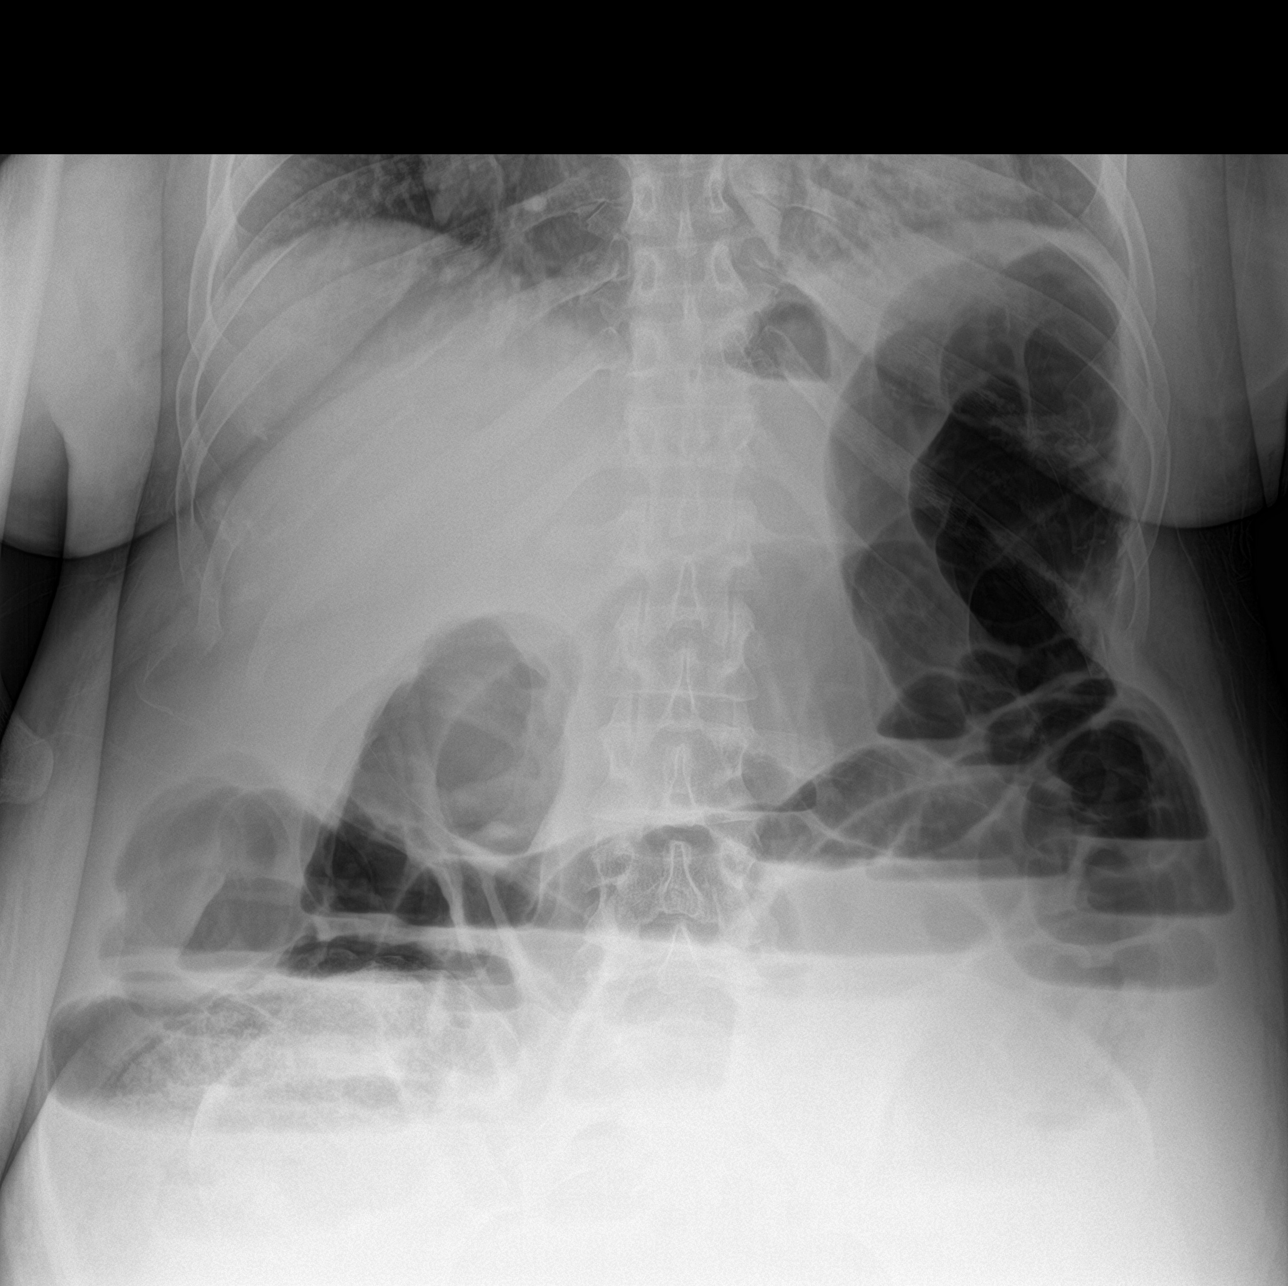

[abdomen supine (1 of 2)]
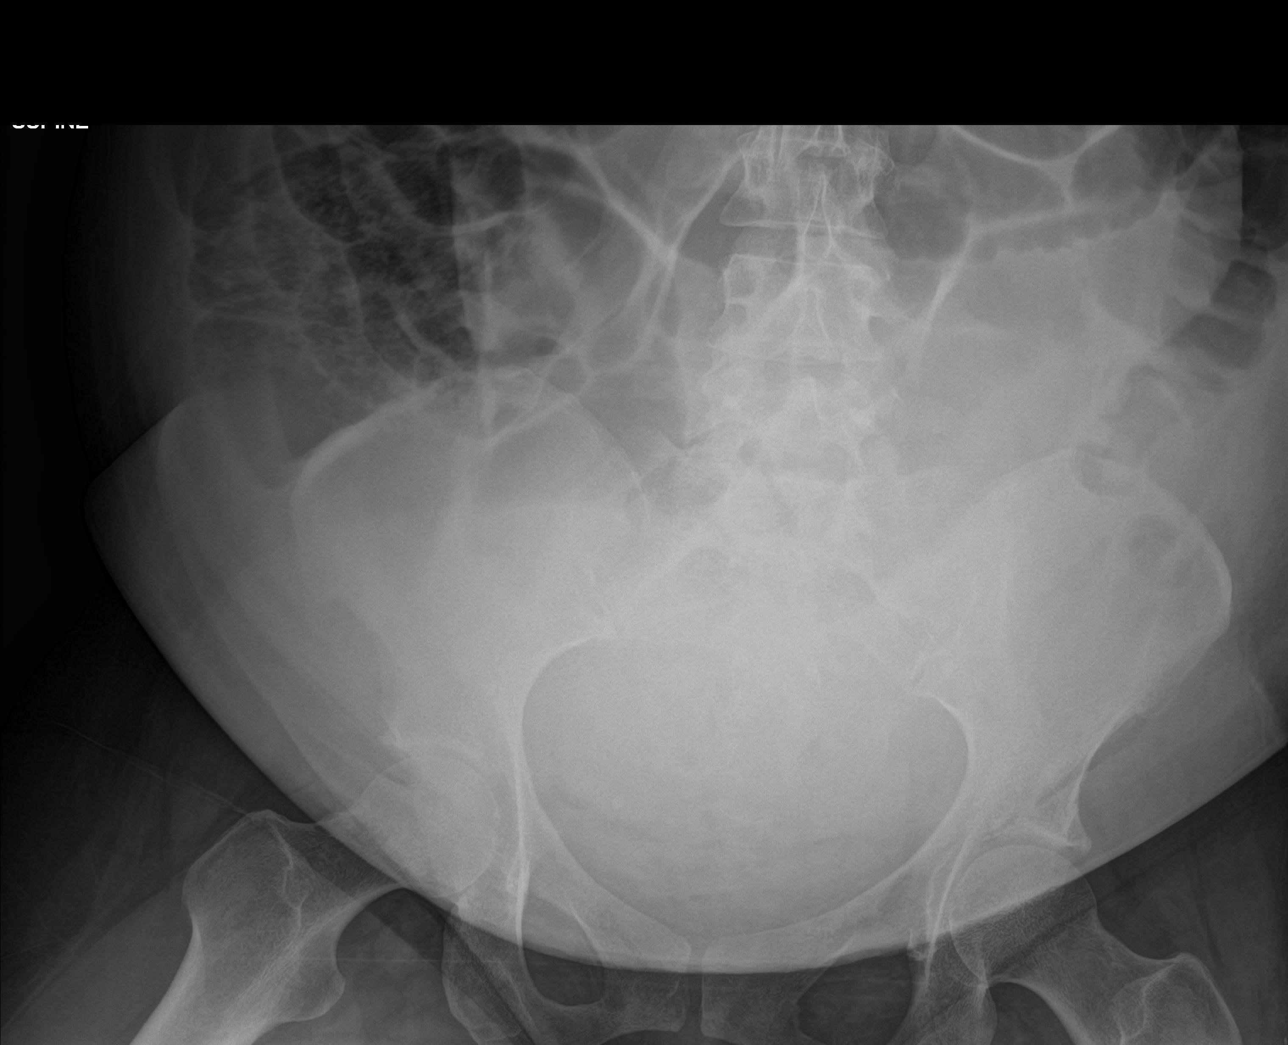

[abdomen supine (2 of 2)]
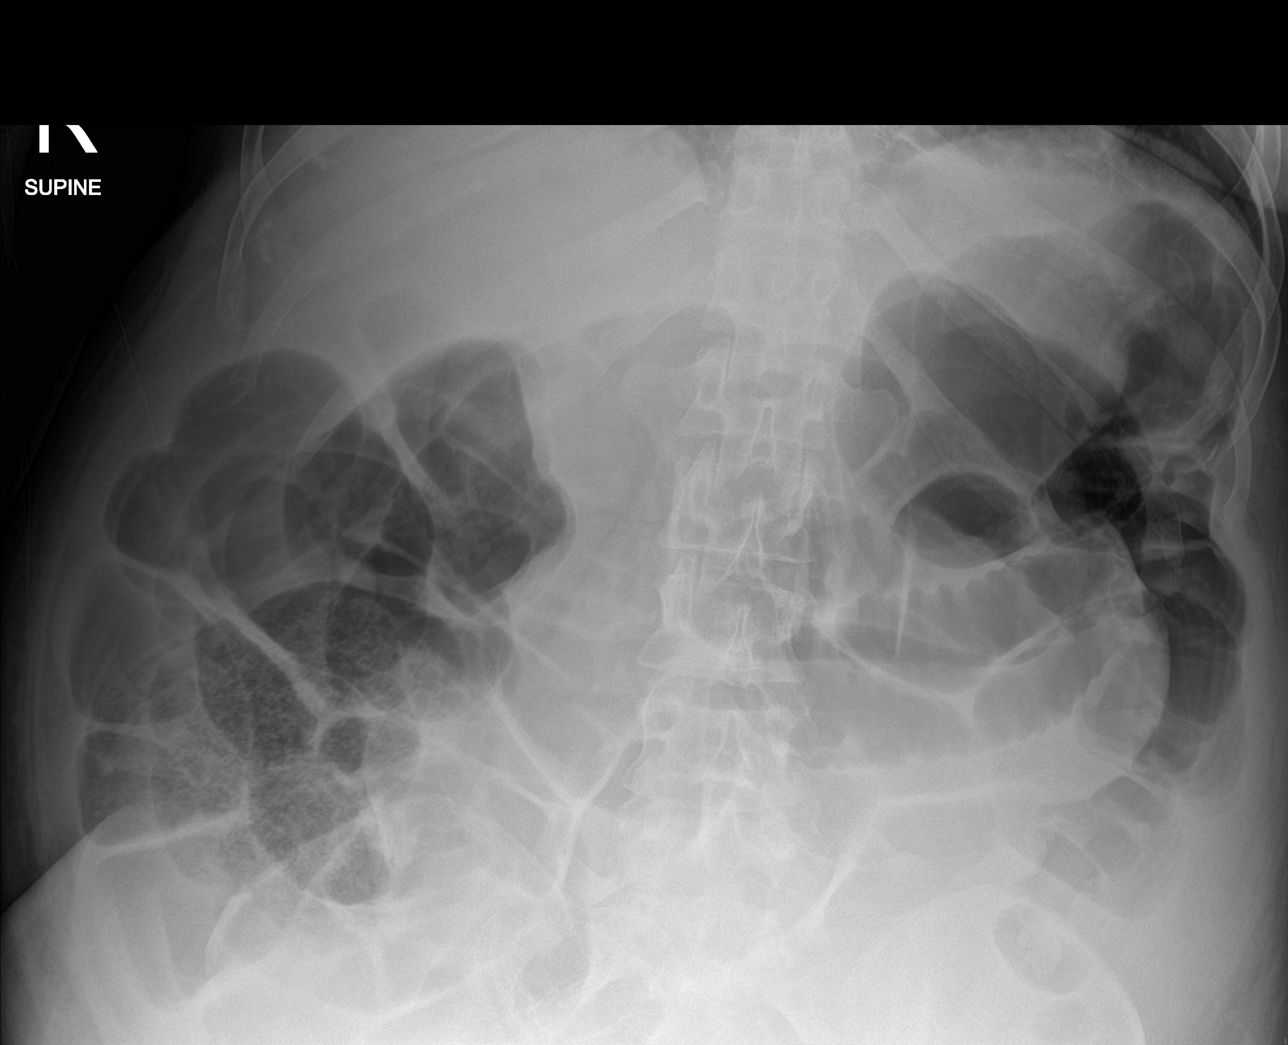

[3 of 3 positions shown; findings below may reference images not displayed]

FINDINGS: Gas-filled large and small bowel loops. Air-fluid levels in large
and small bowel loops. Mild stat of the colon. No free air.

No bowel wall thickening.  No urinary tract calculi.
IMPRESSION: Distended large and small bowel with air-fluid level suggestive of
ileus. No free air.

## 2023-06-10 ENCOUNTER — Ambulatory Visit (INDEPENDENT_AMBULATORY_CARE_PROVIDER_SITE_OTHER): Payer: Medicaid Other | Admitting: Obstetrics & Gynecology

## 2023-06-10 VITALS — BP 110/72 | HR 82 | Wt 180.0 lb

## 2023-06-10 DIAGNOSIS — O0990 Supervision of high risk pregnancy, unspecified, unspecified trimester: Secondary | ICD-10-CM

## 2023-06-10 DIAGNOSIS — Z98891 History of uterine scar from previous surgery: Secondary | ICD-10-CM

## 2023-06-10 DIAGNOSIS — O09299 Supervision of pregnancy with other poor reproductive or obstetric history, unspecified trimester: Secondary | ICD-10-CM

## 2023-06-10 NOTE — Progress Notes (Unsigned)
   PRENATAL VISIT NOTE  Subjective:  Melanie Strong is a 37 y.o. [phone] at [redacted]w[redacted]d being seen today for ongoing prenatal care.  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  Patient reports no complaints.  Contractions: Not present. Vag. Bleeding: None.  Movement: Present. Denies leaking of fluid.   The following portions of the patient's history were reviewed and updated as appropriate: allergies, current medications, past family history, past medical history, past social history, past surgical history and problem list.   Objective:   Vitals:   06/10/23 0939  BP: 110/72  Pulse: 82  Weight: 180 lb (81.6 kg)    Fetal Status: Fetal Heart Rate (bpm): 150   Movement: Present     General:  Alert, oriented and cooperative. Patient is in no acute distress.  Skin: Skin is warm and dry. No rash noted.   Cardiovascular: Normal heart rate noted  Respiratory: Normal respiratory effort, no problems with respiration noted  Abdomen: Soft, gravid, appropriate for gestational age.  Pain/Pressure: Present     Pelvic: Cervical exam deferred        Extremities: Normal range of motion.     Mental Status: Normal mood and affect. Normal behavior. Normal judgment and thought content.   Assessment and Plan:  Pregnancy: [id number] at [redacted]w[redacted]d 1. Supervision of high risk pregnancy, antepartum   2. History of cesarean section Discussed TOLAC possibility after 2 CS  3. Hx of preeclampsia, prior pregnancy, currently pregnant   Preterm labor symptoms and general obstetric precautions including but not limited to vaginal  bleeding, contractions, leaking of fluid and fetal movement were reviewed in detail with the patient. Please refer to After Visit Summary for other counseling recommendations.   Return in about 5 weeks (around 07/15/2023).  Future Appointments  Date Time Provider Department Center  07/28/2023  8:15 AM [facility] NURSE [facility]  07/28/2023  8:30 AM [facility] US1 [facility]    Scheryl Darter, MD

## 2023-07-01 ENCOUNTER — Encounter (HOSPITAL_COMMUNITY): Payer: Self-pay | Admitting: Obstetrics and Gynecology

## 2023-07-01 ENCOUNTER — Inpatient Hospital Stay (HOSPITAL_COMMUNITY)
Admission: AD | Admit: 2023-07-01 | Discharge: 2023-07-01 | Disposition: A | Discharge status: Home / Self Care | Payer: Medicaid Other | Attending: Obstetrics and Gynecology | Admitting: Obstetrics and Gynecology

## 2023-07-01 DIAGNOSIS — Z3A24 24 weeks gestation of pregnancy: Secondary | ICD-10-CM | POA: Insufficient documentation

## 2023-07-01 DIAGNOSIS — O0990 Supervision of high risk pregnancy, unspecified, unspecified trimester: Secondary | ICD-10-CM

## 2023-07-01 DIAGNOSIS — O4702 False labor before 37 completed weeks of gestation, second trimester: Secondary | ICD-10-CM | POA: Insufficient documentation

## 2023-07-01 DIAGNOSIS — O4700 False labor before 37 completed weeks of gestation, unspecified trimester: Secondary | ICD-10-CM | POA: Diagnosis not present

## 2023-07-01 DIAGNOSIS — R1030 Lower abdominal pain, unspecified: Secondary | ICD-10-CM | POA: Diagnosis present

## 2023-07-01 LAB — URINALYSIS, ROUTINE W REFLEX MICROSCOPIC
Bilirubin Urine: NEGATIVE
Glucose, UA: NEGATIVE mg/dL
Hgb urine dipstick: NEGATIVE
Ketones, ur: NEGATIVE mg/dL
Nitrite: NEGATIVE
Protein, ur: NEGATIVE mg/dL
Specific Gravity, Urine: 1.01 (ref 1.005–1.030)
pH: 7 (ref 5.0–8.0)

## 2023-07-01 LAB — WET PREP, GENITAL
Clue Cells Wet Prep HPF POC: NONE SEEN
Sperm: NONE SEEN
Trich, Wet Prep: NONE SEEN
WBC, Wet Prep HPF POC: >=10 — AB (ref <10)
Yeast Wet Prep HPF POC: NONE SEEN

## 2023-07-01 MED ORDER — NIFEDIPINE 10 MG PO CAPS
10.0000 mg | ORAL_CAPSULE | ORAL | Status: DC | PRN
  Administered 2023-07-01 (×2): 10 mg via ORAL
  Filled 2023-07-01 (×2): qty 1

## 2023-07-01 MED ORDER — NIFEDIPINE 10 MG PO CAPS: 10.0000 mg | ORAL_CAPSULE | Freq: Four times a day (QID) | ORAL | 0 refills | Status: DC | PRN

## 2023-07-01 NOTE — MAU Note (Signed)
Melanie Strong is a 37 y.o. at [redacted]w[redacted]d here in MAU reporting: hurting in lower abd, coming and going.  Feels like contractions.  Drinking a lot of water, taking extra strength Tylenol, but it is not helping.  Can't take it.  OB told her to come in.   Onset of complaint: yesterday, took Tylenol and hot shower, went to sleep.  They started up again this morning.  No recent intercourse.  Reports +FM Pain score: 8 Vitals:   07/01/23 1307  BP: 112/64  Pulse: 80  Resp: 18  Temp: 98.2 F (36.8 C)  SpO2: 100%     FHT:164 Lab orders placed from triage:  urine

## 2023-07-01 NOTE — MAU Provider Note (Signed)
CC:  Chief Complaint  Patient presents with   Contractions     Event Date/Time   First Provider Initiated Contact with Patient 07/01/23 1421      HPI: Melanie Strong is a 37 y.o. year old [id number] female at [redacted]w[redacted]d weeks gestation who presents to MAU reporting contractions since yesterday. No improvement w/ Tylenol.   Associated Sx: Neg for fever, chills, N/V/D/C, urinary complaints, vaginal bleeding or vaginal discharge.  Fetal movement: Nml  O: Patient Vitals for the past 24 hrs:  BP Temp Temp src Pulse Resp SpO2 Height Weight  07/01/23 1551 (!) 109/54 -- -- -- -- -- -- --  07/01/23 1521 102/60 -- -- 81 -- -- -- --  07/01/23 1307 112/64 98.2 F (36.8 C) Oral 80 18 100 % 5\' 5"  (1.651 m) 80.9 kg    General: NAD Heart: Regular rate Lungs: Normal rate and effort Abd: Soft, NT, Gravid, S=D Pelvic: NEFG, no blood.  Dilation: Closed Cervical Position: Posterior Exam by:: V.Toby Ayad,CNM  EFM: 150, Moderate variability, 15 x 15 accelerations, no decelerations Toco: Difficult to assess  Results for orders placed or performed during the hospital encounter of 07/01/23 (from the past 48 hour(s))  Urinalysis, Routine w reflex microscopic -Urine, Clean Catch     Status: Abnormal   Collection Time: 07/01/23  1:53 PM  Result Value Ref Range   Color, Urine YELLOW YELLOW   APPearance HAZY (A) CLEAR   Specific Gravity, Urine 1.010 1.005 - 1.030   pH 7.0 5.0 - 8.0   Glucose, UA NEGATIVE NEGATIVE mg/dL   Hgb urine dipstick NEGATIVE NEGATIVE   Bilirubin Urine NEGATIVE NEGATIVE   Ketones, ur NEGATIVE NEGATIVE mg/dL   Protein, ur NEGATIVE NEGATIVE mg/dL   Nitrite NEGATIVE NEGATIVE   Leukocytes,Ua TRACE (A) NEGATIVE   RBC / HPF 0-5 0 - 5 RBC/hpf   WBC, UA 6-10 0 - 5 WBC/hpf   Bacteria, UA MANY (A) NONE SEEN   Squamous Epithelial / HPF 0-5 0 - 5 /HPF   Mucus PRESENT     Comment: Performed at [facility], [address], [place], [place] [address]  Wet prep, genital     Status:  Abnormal   Collection Time: 07/01/23  2:39 PM   Specimen: Vaginal  Result Value Ref Range   Yeast Wet Prep HPF POC NONE SEEN NONE SEEN   Trich, Wet Prep NONE SEEN NONE SEEN   Clue Cells Wet Prep HPF POC NONE SEEN NONE SEEN   WBC, Wet Prep HPF POC >=10 (A) <10   Sperm NONE SEEN     Comment: Performed at [facility], [address] [place], [place] [address]     Orders Placed This Encounter  Procedures   Wet prep, genital   Urinalysis, Routine w reflex microscopic -Urine, Clean Catch   Lab instructions   Meds ordered this encounter  Medications   NIFEdipine (PROCARDIA) capsule 10 mg    A: [redacted]w[redacted]d week IUP No evidence of active preterm labor  FHR reactive  P: Discharge home in stable condition per consult with Rector Bing, MD. Preterm labor precautions and fetal kick counts. Follow-up as scheduled for prenatal visit or sooner as needed if symptoms worsen. Return to maternity admissions as needed if symptoms worsen.  Katrinka Blazing, [place], CNM 07/01/2023 4:46 PM  3

## 2023-07-04 DIAGNOSIS — Z419 Encounter for procedure for purposes other than remedying health state, unspecified: Secondary | ICD-10-CM | POA: Diagnosis not present

## 2023-07-15 ENCOUNTER — Ambulatory Visit (INDEPENDENT_AMBULATORY_CARE_PROVIDER_SITE_OTHER): Payer: Medicaid Other | Admitting: Family Medicine

## 2023-07-15 ENCOUNTER — Other Ambulatory Visit: Payer: Medicaid Other

## 2023-07-15 ENCOUNTER — Encounter: Payer: Self-pay | Admitting: Obstetrics

## 2023-07-15 VITALS — BP 104/63 | HR 81 | Wt 184.0 lb

## 2023-07-15 DIAGNOSIS — Z98891 History of uterine scar from previous surgery: Secondary | ICD-10-CM

## 2023-07-15 DIAGNOSIS — O4700 False labor before 37 completed weeks of gestation, unspecified trimester: Secondary | ICD-10-CM

## 2023-07-15 DIAGNOSIS — O0990 Supervision of high risk pregnancy, unspecified, unspecified trimester: Secondary | ICD-10-CM | POA: Diagnosis not present

## 2023-07-15 DIAGNOSIS — Z3A26 26 weeks gestation of pregnancy: Secondary | ICD-10-CM

## 2023-07-15 NOTE — Progress Notes (Signed)
   PRENATAL VISIT NOTE  Subjective:  Melanie Strong is a 37 y.o. [phone] at [redacted]w[redacted]d being seen today for ongoing prenatal care.  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  Patient reports no bleeding, no cramping, no leaking, and episodes of contractions irregularly .  Contractions: Irregular. Vag. Bleeding: None.  Movement: Present. Denies leaking of fluid.   The following portions of the patient's history were reviewed and updated as appropriate: allergies, current medications, past family history, past medical history, past social history, past surgical history and problem list.   Objective:   Vitals:   07/15/23 0847  BP: 104/63  Pulse: 81  Weight: 184 lb (83.5 kg)    Fetal Status: Fetal Heart Rate (bpm): 150   Movement: Present     General:  Alert, oriented and cooperative. Patient is in no acute distress.  Skin: Skin is warm and dry. No rash noted.   Cardiovascular: Normal heart rate noted  Respiratory: Normal respiratory effort, no problems with respiration noted  Abdomen: Soft, gravid, appropriate for gestational age.  Pain/Pressure: Present     Pelvic: Cervical exam deferred        Extremities: Normal range of motion.     Mental Status: Normal mood and affect. Normal behavior. Normal judgment and thought content.   Assessment and Plan:  Pregnancy: [id number] at [redacted]w[redacted]d 1. Supervision of high risk pregnancy, antepartum FHR and BP within normal limits.  Continue routine pain prenatal care - Glucose Tolerance, 2 Hours w/1 Hour - RPR - CBC - HIV antibody (with reflex)  2. [redacted] weeks  gestation of pregnancy - Glucose Tolerance, 2 Hours w/1 Hour - RPR - CBC - HIV antibody (with reflex)  3. Preterm contractions Patient reports that she has episodes of contractions which occur irregularly.  They are strong and painful.  Prescribed Procardia which she takes and the contractions resolved.  Discussed adequate hydration and continued use of the Procardia and patient is agreeable.  Discussed strict MAU precautions and patient is agreeable.  4. History of cesarean section History of C-section with most recent C-section less than 1 year ago.  Planning for repeat C-section.  Preterm labor symptoms and general obstetric precautions including but not limited to vaginal bleeding, contractions, leaking of fluid and fetal movement were reviewed in detail with the patient. Please refer to After Visit Summary for other counseling recommendations.   No follow-ups on file.  Future Appointments  Date Time Provider Department Center  07/28/2023  8:15 AM [facility] NURSE [facility]  07/28/2023  8:30 AM [facility] US1 [facility]  07/28/2023  1:50 PM Leftwich-Kirby, Wilmer Floor, CNM [facility] None    Celedonio Savage, MD

## 2023-07-15 NOTE — Progress Notes (Signed)
Pt states she is taking Procardia but still having contractions.

## 2023-07-16 DIAGNOSIS — O24419 Gestational diabetes mellitus in pregnancy, unspecified control: Secondary | ICD-10-CM | POA: Insufficient documentation

## 2023-07-16 DIAGNOSIS — O24415 Gestational diabetes mellitus in pregnancy, controlled by oral hypoglycemic drugs: Secondary | ICD-10-CM | POA: Insufficient documentation

## 2023-07-16 LAB — GLUCOSE TOLERANCE, 2 HOURS W/ 1HR
Glucose, 1 hour: 197 mg/dL — ABNORMAL HIGH (ref 70–179)
Glucose, 2 hour: 163 mg/dL — ABNORMAL HIGH (ref 70–152)
Glucose, Fasting: 83 mg/dL (ref 70–91)

## 2023-07-16 LAB — CBC
Hematocrit: 31.6 % — ABNORMAL LOW (ref 34.0–46.6)
Hemoglobin: 10.5 g/dL — ABNORMAL LOW (ref 11.1–15.9)
MCH: 27.3 pg (ref 26.6–33.0)
MCHC: 33.2 g/dL (ref 31.5–35.7)
MCV: 82 fL (ref 79–97)
Platelets: 188 10*3/uL (ref 150–450)
RBC: 3.84 x10E6/uL (ref 3.77–5.28)
RDW: 15 % (ref 11.7–15.4)
WBC: 7.2 10*3/uL (ref 3.4–10.8)

## 2023-07-16 LAB — RPR: RPR Ser Ql: NONREACTIVE

## 2023-07-16 LAB — HIV ANTIBODY (ROUTINE TESTING W REFLEX): HIV Screen 4th Generation wRfx: NONREACTIVE

## 2023-07-18 ENCOUNTER — Encounter: Payer: Self-pay | Admitting: Advanced Practice Midwife

## 2023-07-18 ENCOUNTER — Other Ambulatory Visit: Payer: Self-pay

## 2023-07-18 DIAGNOSIS — O24419 Gestational diabetes mellitus in pregnancy, unspecified control: Secondary | ICD-10-CM

## 2023-07-18 MED ORDER — ACCU-CHEK GUIDE W/DEVICE KIT
1.0000 | PACK | Freq: Every day | 0 refills | Status: DC
Start: 2023-07-18 — End: 2023-10-05

## 2023-07-18 MED ORDER — ACCU-CHEK SOFTCLIX LANCETS MISC
100.0000 | Freq: Four times a day (QID) | 12 refills | Status: DC
Start: 2023-07-18 — End: 2023-10-05

## 2023-07-18 MED ORDER — ACCU-CHEK GUIDE TEST VI STRP: ORAL_STRIP | 12 refills | Status: DC

## 2023-07-23 ENCOUNTER — Encounter: Payer: Medicaid Other | Attending: Obstetrics and Gynecology | Admitting: Dietician

## 2023-07-23 ENCOUNTER — Encounter: Payer: Self-pay | Admitting: Dietician

## 2023-07-23 VITALS — Wt 184.1 lb

## 2023-07-23 DIAGNOSIS — O24419 Gestational diabetes mellitus in pregnancy, unspecified control: Secondary | ICD-10-CM | POA: Insufficient documentation

## 2023-07-23 DIAGNOSIS — Z3A00 Weeks of gestation of pregnancy not specified: Secondary | ICD-10-CM | POA: Insufficient documentation

## 2023-07-23 NOTE — Progress Notes (Signed)
Patient was seen on 07/23/2023 for Gestational Diabetes self-management class at the [facility]. The following learning objectives were met by the patient during this course:  States the definition of Gestational Diabetes States why dietary management is important in controlling blood glucose Describes the effects each nutrient has on blood glucose levels Demonstrates ability to create a balanced meal plan Demonstrates carbohydrate counting  States when to check blood glucose levels Demonstrates proper blood glucose monitoring techniques States the effect of stress and exercise on blood glucose levels States the importance of limiting caffeine and abstaining from alcohol and smoking  Patient instructed to monitor glucose levels: FBS: 60 - <90 1 hour: <140 2 hour: <120  *Patient received handouts: Nutrition Diabetes and Pregnancy Blood glucose log Snack ideas for diabetes during pregnancy  Patient will be seen for follow-up as needed.

## 2023-07-26 ENCOUNTER — Encounter (HOSPITAL_COMMUNITY): Payer: Self-pay | Admitting: Obstetrics and Gynecology

## 2023-07-26 ENCOUNTER — Inpatient Hospital Stay (HOSPITAL_COMMUNITY)
Admission: AD | Admit: 2023-07-26 | Discharge: 2023-07-26 | Disposition: A | Discharge status: Home / Self Care | Payer: Medicaid Other | Attending: Obstetrics and Gynecology | Admitting: Obstetrics and Gynecology

## 2023-07-26 ENCOUNTER — Other Ambulatory Visit: Payer: Self-pay

## 2023-07-26 DIAGNOSIS — Z3A28 28 weeks gestation of pregnancy: Secondary | ICD-10-CM | POA: Insufficient documentation

## 2023-07-26 DIAGNOSIS — O26893 Other specified pregnancy related conditions, third trimester: Secondary | ICD-10-CM | POA: Diagnosis not present

## 2023-07-26 DIAGNOSIS — O368130 Decreased fetal movements, third trimester, not applicable or unspecified: Secondary | ICD-10-CM | POA: Insufficient documentation

## 2023-07-26 DIAGNOSIS — Z3689 Encounter for other specified antenatal screening: Secondary | ICD-10-CM | POA: Diagnosis not present

## 2023-07-26 DIAGNOSIS — O24410 Gestational diabetes mellitus in pregnancy, diet controlled: Secondary | ICD-10-CM | POA: Diagnosis not present

## 2023-07-26 DIAGNOSIS — R350 Frequency of micturition: Secondary | ICD-10-CM | POA: Diagnosis not present

## 2023-07-26 DIAGNOSIS — O09523 Supervision of elderly multigravida, third trimester: Secondary | ICD-10-CM | POA: Diagnosis not present

## 2023-07-26 HISTORY — DX: Gestational diabetes mellitus in pregnancy, unspecified control: O24.419

## 2023-07-26 HISTORY — DX: Type 2 diabetes mellitus without complications: E11.9

## 2023-07-26 LAB — COMPREHENSIVE METABOLIC PANEL
ALT: 10 U/L (ref 0–44)
AST: 11 U/L — ABNORMAL LOW (ref 15–41)
Albumin: 3.1 g/dL — ABNORMAL LOW (ref 3.5–5.0)
Alkaline Phosphatase: 71 U/L (ref 38–126)
Anion gap: 8 (ref 5–15)
BUN: 6 mg/dL (ref 6–20)
CO2: 22 mmol/L (ref 22–32)
Calcium: 9.2 mg/dL (ref 8.9–10.3)
Chloride: 106 mmol/L (ref 98–111)
Creatinine, Ser: 0.63 mg/dL (ref 0.44–1.00)
GFR, Estimated: >60 mL/min (ref >60)
Glucose, Bld: 83 mg/dL (ref 70–99)
Potassium: 3.7 mmol/L (ref 3.5–5.1)
Sodium: 136 mmol/L (ref 135–145)
Total Bilirubin: 0.4 mg/dL (ref <1.2)
Total Protein: 6.3 g/dL — ABNORMAL LOW (ref 6.5–8.1)

## 2023-07-26 LAB — CBC
HCT: 33.2 % — ABNORMAL LOW (ref 36.0–46.0)
Hemoglobin: 10.8 g/dL — ABNORMAL LOW (ref 12.0–15.0)
MCH: 26.7 pg (ref 26.0–34.0)
MCHC: 32.5 g/dL (ref 30.0–36.0)
MCV: 82 fL (ref 80.0–100.0)
Platelets: 187 10*3/uL (ref 150–400)
RBC: 4.05 MIL/uL (ref 3.87–5.11)
RDW: 15.1 % (ref 11.5–15.5)
WBC: 8.4 10*3/uL (ref 4.0–10.5)
nRBC: 0 % (ref 0.0–0.2)

## 2023-07-26 LAB — GLUCOSE, CAPILLARY: Glucose-Capillary: 82 mg/dL (ref 70–99)

## 2023-07-26 LAB — URINALYSIS, ROUTINE W REFLEX MICROSCOPIC
Bilirubin Urine: NEGATIVE
Glucose, UA: NEGATIVE mg/dL
Hgb urine dipstick: NEGATIVE
Ketones, ur: NEGATIVE mg/dL
Leukocytes,Ua: NEGATIVE
Nitrite: NEGATIVE
Protein, ur: NEGATIVE mg/dL
Specific Gravity, Urine: 1.014 (ref 1.005–1.030)
pH: 7 (ref 5.0–8.0)

## 2023-07-26 NOTE — MAU Note (Addendum)
Melanie Strong is a 37 y.o. at [redacted]w[redacted]d here in MAU reporting: hyperglycemia with dizziness. Patient states her blood sugar was 458 at 1311 today with her home glucometer. Patient denies having vaginal bleeding or LOF, denies having contractions, patient reports decreased fetal movement. Patient also reports having a burning sensation on the bottom of her feet, in her words, "it feels like someone is holding a torch under my feet." LMP: 01/11/2023 Onset of complaint: today at 1311 Pain score: 0 Vitals:   07/26/23 1524  BP: (!) 111/55  Pulse: 83  Resp: 16  Temp: 99 F (37.2 C)     FHT: 160 Lab orders placed from triage: CBG and urinalysis

## 2023-07-26 NOTE — MAU Provider Note (Signed)
History     CSN: [id number]  Arrival date and time: 07/26/23 1453   Event Date/Time   First Provider Initiated Contact with Patient 07/26/23 1620      Chief Complaint  Patient presents with   Hyperglycemia   Dizziness   HPI Melanie Strong is a 37 y.o. [id number] at [redacted]w[redacted]d who presents for hyperglycemia.  She was diagnosed with gestational diabetes a few weeks ago.  Not on medications at this point in time.  Has been taking her blood sugars as instructed.  States in the middle of the night she developed  dizziness, which has improved throughout the day.  Denies vomiting, abdominal pain, dysuria, vaginal bleeding, loss of fluid.  Reports decreased fetal movement since this morning.  Still feels movement just not as much.  Reports increase in frequency of urination during the pregnancy.  Her fasting blood sugar this morning was 110, her blood sugar after breakfast was 458.  She ate [place] sausage, eggs, and buttered grits.  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   Gestational diabetes    Glucose found in urine on examination    History of ileus    Infection    UTI   Pregnancy induced hypertension     Past Surgical History:  Procedure Laterality Date   CESAREAN SECTION  08/23/2021   Procedure: CESAREAN SECTION;  Surgeon: [name], MD;  Location: [facility];  Service: Obstetrics;;   CESAREAN SECTION N/A 10/04/2022   Procedure: CESAREAN SECTION;  Surgeon: Adam Phenix, MD;  Location: [facility];  Service: Obstetrics;  Laterality: N/A;    Family History  Problem Relation Age of Onset   Hypertension Mother    Diabetes Mother        Boarderline   Lupus Mother        2nd opinion says no/ unsure    Cancer Father        rectal   Sickle cell anemia Sister    Healthy Brother    Healthy Brother    Cancer Paternal Aunt        brain   Cancer Paternal Uncle        stomach   Diabetes Maternal Grandmother    Other Neg Hx    Asthma Neg Hx    Heart disease Neg Hx    Stroke Neg Hx     Social History   Tobacco Use   Smoking status: Some Days    Current packs/day: 0.25    Average packs/day: 0.3 packs/day for 10.0 years (2.5 ttl pk-yrs)    Types: Cigarettes    Passive exposure: Never   Smokeless tobacco: Never  Vaping Use   Vaping status: Never Used  Substance Use Topics   Alcohol use: No    Comment: rare   Drug use: No    Allergies: No Known Allergies  No medications prior to admission.    Review of Systems  All other systems reviewed and are  negative.  Physical Exam   Blood pressure 107/65, pulse 76, temperature 99 F (37.2 C), temperature source Oral, resp. rate 18, last menstrual period 01/11/2023, SpO2 100%, currently breastfeeding.  Physical Exam Vitals and nursing note reviewed.  Constitutional:      General: She is not in acute distress.    Appearance: She is well-developed. She is not ill-appearing.  HENT:     Head: Normocephalic and atraumatic.  Eyes:     General: No scleral icterus.       Right eye: No discharge.        Left eye: No discharge.     Conjunctiva/sclera: Conjunctivae normal.  Pulmonary:     Effort: Pulmonary effort is normal. No respiratory distress.  Neurological:     General: No focal deficit present.     Mental Status: She is alert.  Psychiatric:        Mood and Affect: Mood normal.        Behavior: Behavior normal.    NST:  Baseline: 150 bpm, Variability: Good {> 6 bpm), Accelerations: Reactive, and Decelerations: Absent  MAU Course  Procedures Results for orders placed or performed during the hospital encounter of 07/26/23 (from the past 24 hour(s))  Glucose, capillary     Status: None   Collection Time: 07/26/23  3:18 PM   Result Value Ref Range   Glucose-Capillary 82 70 - 99 mg/dL  Urinalysis, Routine w reflex microscopic -Urine, Clean Catch     Status: Abnormal   Collection Time: 07/26/23  3:31 PM  Result Value Ref Range   Color, Urine YELLOW YELLOW   APPearance HAZY (A) CLEAR   Specific Gravity, Urine 1.014 1.005 - 1.030   pH 7.0 5.0 - 8.0   Glucose, UA NEGATIVE NEGATIVE mg/dL   Hgb urine dipstick NEGATIVE NEGATIVE   Bilirubin Urine NEGATIVE NEGATIVE   Ketones, ur NEGATIVE NEGATIVE mg/dL   Protein, ur NEGATIVE NEGATIVE mg/dL   Nitrite NEGATIVE NEGATIVE   Leukocytes,Ua NEGATIVE NEGATIVE  CBC     Status: Abnormal   Collection Time: 07/26/23  4:50 PM  Result Value Ref Range   WBC 8.4 4.0 - 10.5 K/uL   RBC 4.05 3.87 - 5.11 MIL/uL   Hemoglobin 10.8 (L) 12.0 - 15.0 g/dL   HCT 16.1 (L) 09.6 - 04.5 %   MCV 82.0 80.0 - 100.0 fL   MCH 26.7 26.0 - 34.0 pg   MCHC 32.5 30.0 - 36.0 g/dL   RDW 40.9 81.1 - 91.4 %   Platelets 187 150 - 400 K/uL   nRBC 0.0 0.0 - 0.2 %  Comprehensive metabolic panel     Status: Abnormal   Collection Time: 07/26/23  4:50 PM  Result Value Ref Range   Sodium 136 135 - 145 mmol/L   Potassium 3.7 3.5 - 5.1 mmol/L   Chloride 106 98 - 111 mmol/L   CO2 22 22 - 32 mmol/L   Glucose, Bld 83 70 - 99 mg/dL   BUN 6 6 - 20 mg/dL   Creatinine, Ser 7.82 0.44 - 1.00 mg/dL   Calcium 9.2 8.9 - 95.6 mg/dL   Total Protein 6.3 (L) 6.5 - 8.1 g/dL   Albumin 3.1 (L) 3.5 - 5.0 g/dL   AST 11 (L) 15 - 41 U/L   ALT 10 0 - 44 U/L   Alkaline Phosphatase 71 38 - 126 U/L   Total Bilirubin 0.4 <1.2 mg/dL   GFR, Estimated >21 >30 mL/min   Anion gap 8 5 - 15  No results found.  MDM NST Labs Reviewed previous records  Assessment and Plan   1. Diet controlled gestational diabetes mellitus (GDM) in third trimester  -CBG in triage is 38.  Not on anything for blood sugar control.  Labs collected, anion gap normal.  Reviewed with Dr. Berton Lan, Most likely glucometer error.  Patient should take  glucometer to her next appointment for calibration.  2. Decreased fetal movements in third trimester, single or unspecified fetus  -Reports fetal movement meant since being on the monitor.  Reactive NST.  Reviewed kick counts.  3. NST (non-stress test) reactive   4. [redacted] weeks gestation of pregnancy      Judeth Horn 07/26/2023, 6:55 PM

## 2023-07-28 ENCOUNTER — Other Ambulatory Visit: Payer: Self-pay | Admitting: *Deleted

## 2023-07-28 ENCOUNTER — Ambulatory Visit: Payer: Medicaid Other | Attending: Maternal & Fetal Medicine

## 2023-07-28 ENCOUNTER — Encounter: Payer: Self-pay | Admitting: Advanced Practice Midwife

## 2023-07-28 ENCOUNTER — Ambulatory Visit: Payer: Medicaid Other | Admitting: *Deleted

## 2023-07-28 ENCOUNTER — Other Ambulatory Visit: Payer: Self-pay

## 2023-07-28 ENCOUNTER — Ambulatory Visit (INDEPENDENT_AMBULATORY_CARE_PROVIDER_SITE_OTHER): Payer: Medicaid Other | Admitting: Advanced Practice Midwife

## 2023-07-28 VITALS — BP 108/63 | HR 82 | Wt 183.8 lb

## 2023-07-28 VITALS — BP 119/59 | HR 71

## 2023-07-28 DIAGNOSIS — O34219 Maternal care for unspecified type scar from previous cesarean delivery: Secondary | ICD-10-CM

## 2023-07-28 DIAGNOSIS — O09523 Supervision of elderly multigravida, third trimester: Secondary | ICD-10-CM

## 2023-07-28 DIAGNOSIS — O24419 Gestational diabetes mellitus in pregnancy, unspecified control: Secondary | ICD-10-CM | POA: Diagnosis not present

## 2023-07-28 DIAGNOSIS — R208 Other disturbances of skin sensation: Secondary | ICD-10-CM

## 2023-07-28 DIAGNOSIS — O0990 Supervision of high risk pregnancy, unspecified, unspecified trimester: Secondary | ICD-10-CM | POA: Diagnosis not present

## 2023-07-28 DIAGNOSIS — O24410 Gestational diabetes mellitus in pregnancy, diet controlled: Secondary | ICD-10-CM

## 2023-07-28 DIAGNOSIS — D563 Thalassemia minor: Secondary | ICD-10-CM | POA: Diagnosis not present

## 2023-07-28 DIAGNOSIS — O99013 Anemia complicating pregnancy, third trimester: Secondary | ICD-10-CM

## 2023-07-28 DIAGNOSIS — Z3A28 28 weeks gestation of pregnancy: Secondary | ICD-10-CM

## 2023-07-28 DIAGNOSIS — O24415 Gestational diabetes mellitus in pregnancy, controlled by oral hypoglycemic drugs: Secondary | ICD-10-CM

## 2023-07-28 DIAGNOSIS — O09299 Supervision of pregnancy with other poor reproductive or obstetric history, unspecified trimester: Secondary | ICD-10-CM

## 2023-07-28 DIAGNOSIS — O358XX0 Maternal care for other (suspected) fetal abnormality and damage, not applicable or unspecified: Secondary | ICD-10-CM

## 2023-07-28 DIAGNOSIS — O09522 Supervision of elderly multigravida, second trimester: Secondary | ICD-10-CM | POA: Insufficient documentation

## 2023-07-28 MED ORDER — GABAPENTIN 300 MG PO CAPS
300.0000 mg | ORAL_CAPSULE | Freq: Two times a day (BID) | ORAL | 2 refills | Status: DC | PRN
Start: 2023-07-28 — End: 2023-10-05

## 2023-07-28 MED ORDER — LIDOCAINE 5 % EX OINT
1.0000 | TOPICAL_OINTMENT | CUTANEOUS | 0 refills | Status: DC | PRN
Start: 2023-07-28 — End: 2023-08-04

## 2023-07-28 MED ORDER — METFORMIN HCL 500 MG PO TABS
500.0000 mg | ORAL_TABLET | Freq: Two times a day (BID) | ORAL | 2 refills | Status: DC
Start: 2023-07-28 — End: 2023-08-11

## 2023-07-28 NOTE — Progress Notes (Unsigned)
F/U on TC received after hours on Sat 11/23 @ 2:20 PM. Pt reported hyperglycemia. Pt was instructed by after hours nurse to go to the hospital. Pt was seen and evaluated in MAU w/ normal labs and reactive fetal monitoring. Advised to bring glucometer to next appt to check calibration.

## 2023-07-28 NOTE — Progress Notes (Signed)
PRENATAL VISIT NOTE  Subjective:  Melanie Strong is a 37 y.o. [phone] at [redacted]w[redacted]d being seen today for ongoing prenatal care.  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  Patient reports  burning sensation of feet "like a lighter under each foot" .  Contractions: Irregular. Vag. Bleeding: None.  Movement: Present. Denies leaking of fluid.   The following portions of the patient's history were reviewed and updated as appropriate: allergies, current medications, past family history, past medical history, past social history, past surgical history and problem list.   Objective:   Vitals:   07/28/23 1352  BP: 108/63  Pulse: 82  Weight: 183 lb 12.8 oz (83.4 kg)    Fetal Status: Fetal Heart Rate (bpm): 143 Fundal Height: 29 cm Movement: Present     General:  Alert, oriented and cooperative. Patient is in no acute distress.  Skin: Skin is warm and dry. No rash noted.   Cardiovascular: Normal heart rate noted  Respiratory: Normal respiratory effort, no problems with respiration noted  Abdomen: Soft, gravid, appropriate for gestational age.  Pain/Pressure: Present     Pelvic: Cervical exam deferred        Extremities: Normal range of motion.  Edema: Trace  Mental Status: Normal mood and affect. Normal behavior. Normal judgment and thought content.   Assessment and Plan:  Pregnancy: [id number] at [redacted]w[redacted]d 1. Diet controlled gestational diabetes mellitus (GDM) in third trimester --Pt had MAU visit with abnormal  glucose readings, brought meter today to calibrate.  Glucose in office 88, pt home meter read 80.  Pt to continue home glucose values with current meter, let office know if there are additional way out of range values. --Reviewed glucose log today and fasting values range between 82 and 110, with 4 out of 7 above 95.  PP values range between 119-190, with ~ 30 % above 120.   --Discussed options, including starting insulin or Metformin. Pt opts to begin metformin 500 mg BID.  --Message sent to schedule pt for weekly antenatal testing at 32 weeks - POCT CBG (Fasting - Glucose)  2. Supervision of high risk pregnancy, antepartum --Anticipatory guidance about next visits/weeks of pregnancy given.   3. [redacted] weeks gestation of pregnancy   4. Burning sensation of foot --Pt very uncomfortable with sensation and concerned about diabetes and neuropathy. --Given GDM and no preexisting DM, and glucose values that are only slightly out of range, I do no expect diabetic neuropathy. --Will begin Metformin to manage GDM and Rx to symptom treat the pain.  Pt to f/u at next visit.   - gabapentin (NEURONTIN) 300 MG capsule; Take 1 capsule (300 mg total) by mouth 2 (two) times daily as needed.  Dispense: 60 capsule; Refill: 2 - lidocaine (XYLOCAINE) 5 % ointment; Apply 1 Application topically as needed.  Dispense: 35.44 g; Refill: 0  5. Gestational diabetes mellitus (GDM) in third trimester controlled on oral hypoglycemic drug  - metFORMIN (GLUCOPHAGE) 500 MG tablet; Take 1 tablet (500 mg total) by mouth 2 (two) times daily with a meal.  Dispense: 60 tablet; Refill: 2 - [place] MFM  FETAL BPP WO NON STRESS; Future   Preterm labor symptoms and general obstetric precautions including but not limited to vaginal bleeding, contractions, leaking of fluid and fetal movement were reviewed in detail with the patient. Please refer to After Visit Summary for other counseling recommendations.   Return in about 2 weeks (around  08/11/2023).  Future Appointments  Date Time Provider Department Center  08/11/2023  1:30 PM Lennart Pall, MD [facility] None  08/25/2023  1:50 PM Constant, Gigi Gin, MD [facility] None  08/28/2023  1:15 PM [facility] NURSE [facility]  08/28/2023  1:30 PM [facility] US1 [facility]  09/08/2023  1:30 PM Leftwich-Kirby, Wilmer Floor, CNM [facility] None  09/22/2023  1:30 PM Leftwich-Kirby, Wilmer Floor, CNM [facility] None  09/29/2023  1:30 PM Leftwich-Kirby, Wilmer Floor, CNM [facility] None    Sharen Counter, CNM

## 2023-08-03 DIAGNOSIS — Z419 Encounter for procedure for purposes other than remedying health state, unspecified: Secondary | ICD-10-CM | POA: Diagnosis not present

## 2023-08-04 ENCOUNTER — Other Ambulatory Visit: Payer: Self-pay | Admitting: Advanced Practice Midwife

## 2023-08-04 DIAGNOSIS — R208 Other disturbances of skin sensation: Secondary | ICD-10-CM

## 2023-08-04 MED ORDER — LIDOCAINE 5 % EX OINT: 1.0000 | TOPICAL_OINTMENT | CUTANEOUS | 1 refills | Status: DC | PRN

## 2023-08-11 ENCOUNTER — Ambulatory Visit (INDEPENDENT_AMBULATORY_CARE_PROVIDER_SITE_OTHER): Payer: Medicaid Other | Admitting: Obstetrics and Gynecology

## 2023-08-11 ENCOUNTER — Encounter: Payer: Self-pay | Admitting: Obstetrics and Gynecology

## 2023-08-11 VITALS — BP 124/73 | HR 93 | Wt 185.4 lb

## 2023-08-11 DIAGNOSIS — Z8619 Personal history of other infectious and parasitic diseases: Secondary | ICD-10-CM

## 2023-08-11 DIAGNOSIS — Z98891 History of uterine scar from previous surgery: Secondary | ICD-10-CM

## 2023-08-11 DIAGNOSIS — O09523 Supervision of elderly multigravida, third trimester: Secondary | ICD-10-CM | POA: Diagnosis not present

## 2023-08-11 DIAGNOSIS — O34219 Maternal care for unspecified type scar from previous cesarean delivery: Secondary | ICD-10-CM

## 2023-08-11 DIAGNOSIS — O09893 Supervision of other high risk pregnancies, third trimester: Secondary | ICD-10-CM | POA: Diagnosis not present

## 2023-08-11 DIAGNOSIS — O24415 Gestational diabetes mellitus in pregnancy, controlled by oral hypoglycemic drugs: Secondary | ICD-10-CM | POA: Diagnosis not present

## 2023-08-11 DIAGNOSIS — Z3A30 30 weeks gestation of pregnancy: Secondary | ICD-10-CM | POA: Diagnosis not present

## 2023-08-11 DIAGNOSIS — O24419 Gestational diabetes mellitus in pregnancy, unspecified control: Secondary | ICD-10-CM

## 2023-08-11 DIAGNOSIS — O09522 Supervision of elderly multigravida, second trimester: Secondary | ICD-10-CM

## 2023-08-11 DIAGNOSIS — O09299 Supervision of pregnancy with other poor reproductive or obstetric history, unspecified trimester: Secondary | ICD-10-CM

## 2023-08-11 DIAGNOSIS — D563 Thalassemia minor: Secondary | ICD-10-CM

## 2023-08-11 DIAGNOSIS — Z148 Genetic carrier of other disease: Secondary | ICD-10-CM

## 2023-08-11 DIAGNOSIS — O0990 Supervision of high risk pregnancy, unspecified, unspecified trimester: Secondary | ICD-10-CM

## 2023-08-11 MED ORDER — METFORMIN HCL 500 MG PO TABS
1000.0000 mg | ORAL_TABLET | Freq: Two times a day (BID) | ORAL | 3 refills | Status: DC
Start: 2023-08-11 — End: 2023-10-05

## 2023-08-11 MED ORDER — ACCU-CHEK GUIDE TEST VI STRP
ORAL_STRIP | 12 refills | Status: DC
Start: 2023-08-11 — End: 2023-10-05

## 2023-08-11 MED ORDER — BLOOD PRESSURE KIT DEVI
1.0000 | 0 refills | Status: DC
Start: 2023-08-11 — End: 2023-10-05

## 2023-08-11 NOTE — Progress Notes (Signed)
PRENATAL VISIT NOTE  Subjective:  Melanie Strong is a 37 y.o. [phone] at [redacted]w[redacted]d being seen today for ongoing prenatal care.  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  Patient reports  doing well overall. Notes heartburn and epigastric pain x 4 days, comes and goes, has not taken any meds for pain .  Contractions: Not present. Vag. Bleeding: None.  Movement: Present. Denies leaking of fluid.   The following portions of the patient's history were reviewed and updated as appropriate: allergies, current medications, past family history, past medical history, past social history, past surgical history and problem list.   Objective:   Vitals:   08/11/23 1333  BP: 124/73  Pulse: 93  Weight: 185 lb 6.4 oz (84.1 kg)   Fetal Status: Fetal Heart Rate (bpm): 144   Movement: Present     General:  Alert, oriented and cooperative. Patient is in no acute distress.  Skin: Skin is warm and dry. No rash noted.   Cardiovascular: Normal heart rate noted  Respiratory: Normal respiratory effort, no problems with respiration noted  Abdomen: Soft, gravid, appropriate for gestational age.  Pain/Pressure: Present      Assessment and Plan:  Pregnancy: [id number] at [redacted]w[redacted]d 1. Supervision of high risk pregnancy, antepartum 2. [redacted] weeks gestation of pregnancy - Blood Pressure Monitoring (BLOOD PRESSURE KIT) DEVI; 1 kit by Does not apply route once a week. Check Blood Pressure regularly and record readings into the Babyscripts App.   Large Cuff.  DX O90.0  Dispense: 1 each; Refill: 0  3. Gestational diabetes mellitus (GDM) in third trimester controlled on oral hypoglycemic drug - BG log reviewed - 8/13 fasting and 11/13 postprandial are above goal.  - Increase to metformin 1000mg  BID.  - Discussed potential need to add insulin if BG are not controlled with metformin. Also discussed taking walks post meals to see if that helps with postprandial control - Growth [place] 11/25 @28 /2 AGA 1131g (63%). Next growth scheduled 12/26 - Weekly BPPs to start w/ next growth  - Discussed timing of delivery - if BG well controlled and fetus AGA plan for rpt CS at 39 weeks. If BG unable to be controlled or LGA fetus, would plan for rpt section at 37 weeks.  - metFORMIN (GLUCOPHAGE) 500 MG tablet; Take 2 tablets (1,000 mg total) by mouth 2 (two) times daily with a meal.  Dispense: 120 tablet; Refill: 3 - glucose blood (ACCU-CHEK GUIDE TEST) test strip; Use four times daily to check blood sugar fasting and 2 hours post meal  Dispense: 200 each; Refill: 12  5. History of cesarean section Prior CS x 2 - arrest of dilation/NRFHT then elective repeat Plans elective repeat  6. History of ELISA positive for HSV Suppression at 36w  7. Multigravida of advanced maternal age in second trimester LR NIPS ldASA  8. Hx of preeclampsia, prior pregnancy, currently pregnant Normotensive Reviewed si/sx preE Lost home BP cuff - reordered today ldASA  9. Carrier of spinal muscular atrophy 10. 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 Alpha thalassemia silent carrier FOB is not a carrier  Please refer to  After Visit Summary for other counseling recommendations.   Return in about 2 weeks (around 08/25/2023) for return OB at 32 weeks.  Future Appointments  Date Time Provider Department Center  08/25/2023  1:50 PM Constant, Peggy, MD [facility] None  08/28/2023  1:15 PM [facility] NURSE [facility]  08/28/2023  1:30 PM [facility] US1 [facility]  09/08/2023  1:30 PM  Leftwich-Kirby, Wilmer Floor, CNM [facility] None  09/22/2023  1:30 PM Leftwich-Kirby, Wilmer Floor, CNM [facility] None  09/29/2023  1:50 PM Leftwich-Kirby, Wilmer Floor, CNM [facility] None    Lennart Pall, MD

## 2023-08-12 DIAGNOSIS — O0990 Supervision of high risk pregnancy, unspecified, unspecified trimester: Secondary | ICD-10-CM | POA: Diagnosis not present

## 2023-08-13 ENCOUNTER — Encounter: Payer: Self-pay | Admitting: Obstetrics and Gynecology

## 2023-08-14 ENCOUNTER — Encounter: Payer: Self-pay | Admitting: Advanced Practice Midwife

## 2023-08-14 ENCOUNTER — Encounter: Payer: Self-pay | Admitting: Obstetrics and Gynecology

## 2023-08-20 ENCOUNTER — Encounter: Payer: Self-pay | Admitting: Obstetrics and Gynecology

## 2023-08-22 ENCOUNTER — Inpatient Hospital Stay (HOSPITAL_COMMUNITY)
Admission: AD | Admit: 2023-08-22 | Discharge: 2023-08-22 | Discharge status: Against Medical Advice | Payer: Medicaid Other | Attending: Obstetrics & Gynecology | Admitting: Obstetrics & Gynecology

## 2023-08-22 ENCOUNTER — Encounter: Payer: Self-pay | Admitting: *Deleted

## 2023-08-22 DIAGNOSIS — Z5321 Procedure and treatment not carried out due to patient leaving prior to being seen by health care provider: Secondary | ICD-10-CM | POA: Insufficient documentation

## 2023-08-22 LAB — CBC WITH DIFFERENTIAL/PLATELET
Abs Immature Granulocytes: 0.15 10*3/uL — ABNORMAL HIGH (ref 0.00–0.07)
Basophils Absolute: 0 10*3/uL (ref 0.0–0.1)
Basophils Relative: 0 %
Eosinophils Absolute: 0.1 10*3/uL (ref 0.0–0.5)
Eosinophils Relative: 1 %
HCT: 34.2 % — ABNORMAL LOW (ref 36.0–46.0)
Hemoglobin: 11.1 g/dL — ABNORMAL LOW (ref 12.0–15.0)
Immature Granulocytes: 2 %
Lymphocytes Relative: 22 %
Lymphs Abs: 1.7 10*3/uL (ref 0.7–4.0)
MCH: 26.7 pg (ref 26.0–34.0)
MCHC: 32.5 g/dL (ref 30.0–36.0)
MCV: 82.4 fL (ref 80.0–100.0)
Monocytes Absolute: 0.7 10*3/uL (ref 0.1–1.0)
Monocytes Relative: 9 %
Neutro Abs: 5.1 10*3/uL (ref 1.7–7.7)
Neutrophils Relative %: 66 %
Platelets: 186 10*3/uL (ref 150–400)
RBC: 4.15 MIL/uL (ref 3.87–5.11)
RDW: 14.8 % (ref 11.5–15.5)
WBC: 7.8 10*3/uL (ref 4.0–10.5)
nRBC: 0 % (ref 0.0–0.2)

## 2023-08-22 LAB — COMPREHENSIVE METABOLIC PANEL
ALT: 11 U/L (ref 0–44)
AST: 14 U/L — ABNORMAL LOW (ref 15–41)
Albumin: 3.1 g/dL — ABNORMAL LOW (ref 3.5–5.0)
Alkaline Phosphatase: 108 U/L (ref 38–126)
Anion gap: 7 (ref 5–15)
BUN: <5 mg/dL — ABNORMAL LOW (ref 6–20)
CO2: 21 mmol/L — ABNORMAL LOW (ref 22–32)
Calcium: 9.7 mg/dL (ref 8.9–10.3)
Chloride: 108 mmol/L (ref 98–111)
Creatinine, Ser: 0.55 mg/dL (ref 0.44–1.00)
GFR, Estimated: >60 mL/min (ref >60)
Glucose, Bld: 92 mg/dL (ref 70–99)
Potassium: 4.1 mmol/L (ref 3.5–5.1)
Sodium: 136 mmol/L (ref 135–145)
Total Bilirubin: 0.5 mg/dL (ref <1.2)
Total Protein: 6.6 g/dL (ref 6.5–8.1)

## 2023-08-22 LAB — TYPE AND SCREEN
ABO/RH(D): O POS
Antibody Screen: NEGATIVE

## 2023-08-22 LAB — GLUCOSE, CAPILLARY: Glucose-Capillary: 80 mg/dL (ref 70–99)

## 2023-08-22 NOTE — Progress Notes (Signed)
Pt signed AMA form and left MAU waiting area before being seen by a provider.

## 2023-08-22 NOTE — MAU Note (Addendum)
LISSET PACKARD is a 37 y.o. at [redacted]w[redacted]d here in MAU reporting: has gest diab.  Sugars have been high, was put on "4 pills of Metformin". Was told to come in for possible admit. Slight contractions, doesn't need to take anything for them, hasn't had any since 1100. No bleeding or leaking. Reports +FM Onset of complaint: ongoing Pain score: none now Vitals:   08/22/23 1531  BP: 109/61  Pulse: 85  Resp: 16  Temp: 98.6 F (37 C)  SpO2: 100%     FHT:147 Lab orders placed from triage CBG 80; blood work has been drawn

## 2023-08-25 ENCOUNTER — Other Ambulatory Visit: Payer: Self-pay | Admitting: *Deleted

## 2023-08-25 ENCOUNTER — Encounter: Payer: Self-pay | Admitting: Obstetrics and Gynecology

## 2023-08-25 ENCOUNTER — Ambulatory Visit (INDEPENDENT_AMBULATORY_CARE_PROVIDER_SITE_OTHER): Payer: Medicaid Other | Admitting: Obstetrics and Gynecology

## 2023-08-25 VITALS — BP 109/69 | HR 90 | Wt 190.0 lb

## 2023-08-25 DIAGNOSIS — Z3A32 32 weeks gestation of pregnancy: Secondary | ICD-10-CM

## 2023-08-25 DIAGNOSIS — R208 Other disturbances of skin sensation: Secondary | ICD-10-CM

## 2023-08-25 DIAGNOSIS — O09522 Supervision of elderly multigravida, second trimester: Secondary | ICD-10-CM

## 2023-08-25 DIAGNOSIS — O0993 Supervision of high risk pregnancy, unspecified, third trimester: Secondary | ICD-10-CM

## 2023-08-25 DIAGNOSIS — Z98891 History of uterine scar from previous surgery: Secondary | ICD-10-CM

## 2023-08-25 DIAGNOSIS — Z8619 Personal history of other infectious and parasitic diseases: Secondary | ICD-10-CM

## 2023-08-25 DIAGNOSIS — O0990 Supervision of high risk pregnancy, unspecified, unspecified trimester: Secondary | ICD-10-CM

## 2023-08-25 DIAGNOSIS — O09299 Supervision of pregnancy with other poor reproductive or obstetric history, unspecified trimester: Secondary | ICD-10-CM

## 2023-08-25 DIAGNOSIS — O24415 Gestational diabetes mellitus in pregnancy, controlled by oral hypoglycemic drugs: Secondary | ICD-10-CM

## 2023-08-25 DIAGNOSIS — O09523 Supervision of elderly multigravida, third trimester: Secondary | ICD-10-CM

## 2023-08-25 DIAGNOSIS — O09293 Supervision of pregnancy with other poor reproductive or obstetric history, third trimester: Secondary | ICD-10-CM

## 2023-08-25 MED ORDER — LIDOCAINE 5 % EX OINT: 1.0000 | TOPICAL_OINTMENT | CUTANEOUS | 1 refills | Status: DC | PRN

## 2023-08-25 NOTE — Progress Notes (Signed)
   PRENATAL VISIT NOTE  Subjective:  Melanie Strong is a 37 y.o. [phone] at [redacted]w[redacted]d being seen today for ongoing prenatal care.  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  Patient reports no complaints.  Contractions: Irritability. Vag. Bleeding: None.  Movement: Present. Denies leaking of fluid.   The following portions of the patient's history were reviewed and updated as appropriate: allergies, current medications, past family history, past medical history, past social history, past surgical history and problem list.   Objective:   Vitals:   08/25/23 1349  BP: 109/69  Pulse: 90  Weight: 190 lb (86.2 kg)    Fetal Status: Fetal Heart Rate (bpm): 155 (Simultaneous filing. User may not have seen previous data.) Fundal Height: 33 cm Movement: Present     General:  Alert, oriented and cooperative. Patient is in no acute distress.  Skin: Skin is warm and dry. No rash noted.   Cardiovascular: Normal heart rate noted  Respiratory: Normal respiratory effort, no problems with respiration noted  Abdomen: Soft, gravid, appropriate for gestational age.  Pain/Pressure: Present     Pelvic: Cervical exam deferred        Extremities: Normal range of motion.  Edema: Mild pitting, slight indentation  Mental Status: Normal mood and affect. Normal behavior. Normal judgment and thought content.   Assessment and Plan:  Pregnancy: [id number] at [redacted]w[redacted]d 1. Supervision of high risk pregnancy, antepartum (Primary) Patient is  doing well without complaints Patient is considering Nexplanon for contraception  2. Gestational diabetes mellitus (GDM) in third trimester controlled on oral hypoglycemic drug CBGs reviewed and greatly improved with Metformin 1000 BID Encouraged patient to walk following meals Continue metformin at current dosing Follow up growth ultrasound 08/28/23  3. History of cesarean section Plans repeat cesarean section  4. History of ELISA positive for HSV Suppression at 34-36 weeks  5. Hx of preeclampsia, prior pregnancy, currently pregnant Normotensive and asymptomatic  6. Multigravida of advanced maternal age in second trimester   Preterm labor symptoms and general obstetric precautions including but not limited to vaginal bleeding, contractions, leaking of fluid and fetal movement were reviewed in detail with the patient. Please refer to After Visit Summary for other counseling recommendations.   Return in about 2 weeks (around 09/08/2023) for in person, ROB, High risk.  Future Appointments  Date Time Provider Department Center  08/28/2023  1:15 PM [facility] NURSE [facility]  08/28/2023  1:30 PM [facility] US3 [facility]  09/08/2023  1:30 PM Leftwich-Kirby, Wilmer Floor, CNM [facility] None  09/22/2023  1:30 PM Leftwich-Kirby, Wilmer Floor, CNM [facility] None  09/29/2023  1:50 PM Leftwich-Kirby, Wilmer Floor, CNM [facility] None  11/26/2023 10:30 AM Sater, Pearletha Furl, MD [facility] None    Catalina Antigua, MD

## 2023-08-25 NOTE — Progress Notes (Signed)
Received Babyscripts alerts for elevated BPs. Pt has been seen in MAU since readings were reported. BP WNL in MAU.

## 2023-08-26 DIAGNOSIS — Z3483 Encounter for supervision of other normal pregnancy, third trimester: Secondary | ICD-10-CM | POA: Diagnosis not present

## 2023-08-26 DIAGNOSIS — Z3482 Encounter for supervision of other normal pregnancy, second trimester: Secondary | ICD-10-CM | POA: Diagnosis not present

## 2023-08-28 ENCOUNTER — Encounter: Payer: Self-pay | Admitting: Obstetrics and Gynecology

## 2023-08-28 ENCOUNTER — Ambulatory Visit: Payer: Medicaid Other | Attending: Obstetrics and Gynecology

## 2023-08-28 ENCOUNTER — Ambulatory Visit: Payer: Medicaid Other | Admitting: *Deleted

## 2023-08-28 ENCOUNTER — Other Ambulatory Visit: Payer: Self-pay | Admitting: *Deleted

## 2023-08-28 VITALS — BP 116/69 | HR 89

## 2023-08-28 DIAGNOSIS — O24419 Gestational diabetes mellitus in pregnancy, unspecified control: Secondary | ICD-10-CM

## 2023-08-28 DIAGNOSIS — O09523 Supervision of elderly multigravida, third trimester: Secondary | ICD-10-CM | POA: Diagnosis not present

## 2023-08-28 DIAGNOSIS — O99013 Anemia complicating pregnancy, third trimester: Secondary | ICD-10-CM

## 2023-08-28 DIAGNOSIS — O0990 Supervision of high risk pregnancy, unspecified, unspecified trimester: Secondary | ICD-10-CM

## 2023-08-28 DIAGNOSIS — O09299 Supervision of pregnancy with other poor reproductive or obstetric history, unspecified trimester: Secondary | ICD-10-CM | POA: Insufficient documentation

## 2023-08-28 DIAGNOSIS — D563 Thalassemia minor: Secondary | ICD-10-CM

## 2023-08-28 DIAGNOSIS — O34219 Maternal care for unspecified type scar from previous cesarean delivery: Secondary | ICD-10-CM | POA: Diagnosis not present

## 2023-08-28 DIAGNOSIS — Z3A32 32 weeks gestation of pregnancy: Secondary | ICD-10-CM | POA: Diagnosis not present

## 2023-08-28 DIAGNOSIS — O24415 Gestational diabetes mellitus in pregnancy, controlled by oral hypoglycemic drugs: Secondary | ICD-10-CM

## 2023-09-02 ENCOUNTER — Encounter (HOSPITAL_COMMUNITY): Payer: Self-pay | Admitting: Obstetrics and Gynecology

## 2023-09-02 ENCOUNTER — Encounter: Payer: Self-pay | Admitting: Obstetrics and Gynecology

## 2023-09-02 ENCOUNTER — Other Ambulatory Visit: Payer: Self-pay

## 2023-09-02 ENCOUNTER — Inpatient Hospital Stay (HOSPITAL_COMMUNITY)
Admission: AD | Admit: 2023-09-02 | Discharge: 2023-09-02 | Disposition: A | Discharge status: Home / Self Care | Payer: Medicaid Other | Attending: Obstetrics and Gynecology | Admitting: Obstetrics and Gynecology

## 2023-09-02 DIAGNOSIS — Z3A33 33 weeks gestation of pregnancy: Secondary | ICD-10-CM | POA: Diagnosis not present

## 2023-09-02 DIAGNOSIS — O09523 Supervision of elderly multigravida, third trimester: Secondary | ICD-10-CM | POA: Diagnosis not present

## 2023-09-02 DIAGNOSIS — O99333 Smoking (tobacco) complicating pregnancy, third trimester: Secondary | ICD-10-CM | POA: Diagnosis not present

## 2023-09-02 DIAGNOSIS — O26893 Other specified pregnancy related conditions, third trimester: Secondary | ICD-10-CM | POA: Insufficient documentation

## 2023-09-02 DIAGNOSIS — Z3A34 34 weeks gestation of pregnancy: Secondary | ICD-10-CM | POA: Diagnosis not present

## 2023-09-02 DIAGNOSIS — O4703 False labor before 37 completed weeks of gestation, third trimester: Secondary | ICD-10-CM

## 2023-09-02 DIAGNOSIS — O0990 Supervision of high risk pregnancy, unspecified, unspecified trimester: Secondary | ICD-10-CM

## 2023-09-02 LAB — URINALYSIS, ROUTINE W REFLEX MICROSCOPIC
Bilirubin Urine: NEGATIVE
Glucose, UA: NEGATIVE mg/dL
Hgb urine dipstick: NEGATIVE
Ketones, ur: 5 mg/dL — AB
Nitrite: NEGATIVE
Protein, ur: NEGATIVE mg/dL
Specific Gravity, Urine: 1.016 (ref 1.005–1.030)
pH: 6 (ref 5.0–8.0)

## 2023-09-02 MED ORDER — LACTATED RINGERS IV BOLUS
1000.0000 mL | Freq: Once | INTRAVENOUS | Status: AC
  Administered 2023-09-02: 1000 mL via INTRAVENOUS

## 2023-09-02 MED ORDER — NIFEDIPINE 10 MG PO CAPS
10.0000 mg | ORAL_CAPSULE | ORAL | Status: AC
  Administered 2023-09-02 (×3): 10 mg via ORAL
  Filled 2023-09-02 (×3): qty 1

## 2023-09-02 NOTE — MAU Note (Signed)
...  Melanie Strong is a 37 y.o. at [redacted]w[redacted]d here in MAU reporting: CTX's for the past five days that have been unrelieved by taking Nifedipine  every six hours while awake. Last dose around midnight. She also reports constant pelvic pain this pregnancy that is worse with movement. She reports no cervical exams yet this pregnancy. Planning repeat C/S. Denies VB or LOF. +FM.  GDM - Metformin  1000 mg BID. Weekly BPP's. Hx C/S x2, HSV, and Pre-E.  Last CBG today 96 this AM.  Onset of complaint: 5 days  Pain score:  9/10 abdomen - CTX 8/10 pelvis  Vitals:   09/02/23 1046  BP: 131/75  Pulse: 95  Resp: 16  Temp: 98.4 F (36.9 C)  SpO2: 100%      FHT: 144 initial external Lab orders placed from triage: UA

## 2023-09-02 NOTE — MAU Note (Signed)
.  Melanie Strong is a 37 y.o. at [redacted]w[redacted]d here in MAU reporting: Nausea by 5 days but able to eat and drink normally till today and contractions by 5 days that my meds are not working for now, no c/o srom, bleeding, decreased FM, HA, or epigastic pain. EFM explained and applied to soft non tender abd. Abd palpated during report contraction- mild to palpation and short 20-30 secs in length.    Onset of complaint: 5 days Pain score: 9 Vitals:   09/02/23 1058 09/02/23 1100  BP: 128/77   Pulse: 95   Resp:    Temp:    SpO2:  99%     FHT:140

## 2023-09-02 NOTE — MAU Provider Note (Signed)
 History     CSN: [id number]  Arrival date and time: 09/02/23 1034   Event Date/Time   First Provider Initiated Contact with Patient 09/02/23 1136      Chief Complaint  Patient presents with   Contractions   HPI [name] is a 37 y.o. year old [id number] female at [redacted]w[redacted]d weeks gestation who presents to MAU reporting nausea x 5 days. She has been able to eat and drink until today. She has been having contractions x 5 days. She has been taking Nifedipine  every 6 hours, but she states they don't work. Her last dose was at midnight. She rates the pain from contractions 9/10. She also reports constant pain in pelvis that increases with movement; rated 8/10. She denies VB or LOF.  Per the triage RN's report the UC's are mild to palpation and are 20-30 secs in duration. Her pregnancy is complicated by GDM on Metformin  1000 mg BID (CBG 96). She receives [facility] with [facility]; next appt is 09/05/2023 & 09/08/2023.   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   Gestational diabetes    History of ileus    Pregnancy induced hypertension     Past Surgical History:  Procedure Laterality Date   CESAREAN SECTION  08/23/2021   Procedure: CESAREAN SECTION;  Surgeon: Eldonna Suzen Octave, MD;  Location: [facility] LD [facility];  Service: Obstetrics;;   CESAREAN SECTION N/A 10/04/2022   Procedure: CESAREAN SECTION;  Surgeon: Eveline Lynwood MATSU, MD;  Location: [facility]S;  Service: Obstetrics;  Laterality: N/A;    Family History  Problem Relation Age of Onset   Hypertension Mother    Diabetes Mother        Boarderline   Lupus Mother        2nd opinion says no/ unsure   Cancer Father        rectal   Sickle cell anemia Sister    Healthy Brother     Healthy Brother    Cancer Paternal Aunt        brain   Cancer Paternal Uncle        stomach   Diabetes Maternal Grandmother    Other Neg Hx    Asthma Neg Hx    Heart disease Neg Hx    Stroke Neg Hx     Social History   Tobacco Use   Smoking status: Some Days    Current packs/day: 0.25    Average packs/day: 0.3 packs/day for 10.0 years (2.5 ttl pk-yrs)    Types: Cigarettes    Passive exposure: Never   Smokeless tobacco: Never  Vaping Use   Vaping status: Never Used  Substance Use Topics   Alcohol use: No    Comment: rare   Drug use: No    Allergies: No Known Allergies  Medications Prior to Admission  Medication Sig Dispense Refill Last Dose/Taking   aspirin  EC 81 MG tablet Take 1 tablet (81 mg total) by mouth daily. Take after  12 weeks for prevention of preeclampsia later in pregnancy 300 tablet 2 09/01/2023 Morning   gabapentin  (NEURONTIN ) 300 MG capsule Take 1 capsule (300 mg total) by mouth 2 (two) times daily as needed. 60 capsule 2 Past Month   lidocaine  (XYLOCAINE ) 5 % ointment Apply 1 Application topically as needed. 35.44 g 1 09/01/2023 Bedtime   metFORMIN  (GLUCOPHAGE ) 500 MG tablet Take 2 tablets (1,000 mg total) by mouth 2 (two) times daily with a meal. 120 tablet 3 09/01/2023 at  9:30 PM   NIFEdipine  (ADALAT  CC) 30 MG 24 hr tablet Take 30 mg by mouth daily.   09/01/2023 at  9:30 PM   Prenatal Vit-Fe Fumarate-FA (MULTIVITAMIN-PRENATAL) 27-0.8 MG TABS tablet Take 1 tablet by mouth daily before breakfast. 30 tablet 12 09/01/2023 Morning   Accu-Chek Softclix Lancets lancets 100 each by Other route 4 (four) times daily. 100 each 12    Blood Glucose Monitoring Suppl (ACCU-CHEK GUIDE) w/Device KIT 1 kit by Does not apply route daily. 1 kit 0    Blood Pressure Monitoring (BLOOD PRESSURE KIT) DEVI 1 kit by Does not apply route once a week. Check Blood Pressure regularly and record readings into the Babyscripts App.  Large Cuff.  DX O90.0 1 each 0    glucose blood (ACCU-CHEK  GUIDE TEST) test strip Use four times daily to check blood sugar fasting and 2 hours post meal 200 each 12     Review of Systems  Constitutional:  Positive for appetite change (unable to eat and drink today).  HENT: Negative.    Eyes: Negative.   Respiratory: Negative.    Cardiovascular: Negative.   Gastrointestinal:  Positive for nausea.  Endocrine: Negative.   Genitourinary:  Positive for pelvic pain (frequent contractions).  Musculoskeletal: Negative.   Skin: Negative.   Allergic/Immunologic: Negative.   Neurological: Negative.   Hematological: Negative.   Psychiatric/Behavioral: Negative.     Physical Exam   Blood pressure 128/77, pulse 95, temperature 98.4 F (36.9 C), temperature source Oral, resp. rate 16, height 5' 5 (1.651 m), weight 86.1 kg, last menstrual period 01/11/2023, SpO2 99%, currently breastfeeding.  Physical Exam Vitals and nursing note reviewed. Exam conducted with a chaperone present.  Constitutional:      Appearance: Normal appearance. She is obese.  Cardiovascular:     Rate and Rhythm: Normal rate.  Pulmonary:     Effort: Pulmonary effort is normal.  Abdominal:     Palpations: Abdomen is soft.  Genitourinary:    General: Normal vulva.     Comments: Dilation: Closed Exam by:: JONELLE Cart CNM  Musculoskeletal:        General: Normal range of motion.  Skin:    General: Skin is warm and dry.  Neurological:     Mental Status: She is alert and oriented to person, place, and time.  Psychiatric:        Mood and Affect: Mood normal.        Behavior: Behavior normal.        Thought Content: Thought content normal.        Judgment: Judgment normal.    Reassessment @ 1315: Patient still feeling contractions, but feels better. MAU Course  Procedures  MDM CCUA Procardia  10 mg every 20 mins x 3 doses  Results for orders placed or performed during the hospital encounter of 09/02/23 (from the past 24 hours)  Urinalysis, Routine w reflex microscopic  -Urine, Clean Catch     Status: Abnormal   Collection Time: 09/02/23 11:02 AM  Result Value Ref Range   Color, Urine YELLOW YELLOW   APPearance HAZY (A) CLEAR   Specific Gravity, Urine 1.016 1.005 - 1.030   pH 6.0 5.0 - 8.0   Glucose, UA NEGATIVE NEGATIVE mg/dL   Hgb urine dipstick NEGATIVE NEGATIVE   Bilirubin Urine NEGATIVE NEGATIVE   Ketones, ur 5 (A) NEGATIVE mg/dL   Protein, ur NEGATIVE NEGATIVE mg/dL   Nitrite NEGATIVE NEGATIVE   Leukocytes,Ua TRACE (A) NEGATIVE   RBC / HPF 0-5 0 - 5 RBC/hpf   WBC, UA 0-5 0 - 5 WBC/hpf   Bacteria, UA MANY (A) NONE SEEN   Squamous Epithelial / HPF 0-5 0 - 5 /HPF   Mucus PRESENT     Assessment and Plan  1. Preterm uterine contractions in third trimester, antepartum - Advised to continue Procardia  as previously prescribed until 34 weeks  2. [redacted] weeks gestation of pregnancy   - Discharge patient - Keep scheduled appt with [facility] on 09/05/2023 - Patient verbalized an understanding of the plan of care and agrees.  Ala Cart, CNM 09/02/2023, 11:36 AM

## 2023-09-02 NOTE — Discharge Instructions (Signed)
You should stop taking the Nifedipine every 6 hours when you are 34 weeks (in 4 days).

## 2023-09-03 DIAGNOSIS — Z419 Encounter for procedure for purposes other than remedying health state, unspecified: Secondary | ICD-10-CM | POA: Diagnosis not present

## 2023-09-06 ENCOUNTER — Encounter: Payer: Self-pay | Admitting: Family Medicine

## 2023-09-08 ENCOUNTER — Ambulatory Visit (INDEPENDENT_AMBULATORY_CARE_PROVIDER_SITE_OTHER): Payer: Medicaid Other | Admitting: Advanced Practice Midwife

## 2023-09-08 VITALS — BP 128/70 | HR 90 | Wt 193.0 lb

## 2023-09-08 DIAGNOSIS — Z3A34 34 weeks gestation of pregnancy: Secondary | ICD-10-CM

## 2023-09-08 DIAGNOSIS — O24415 Gestational diabetes mellitus in pregnancy, controlled by oral hypoglycemic drugs: Secondary | ICD-10-CM | POA: Diagnosis not present

## 2023-09-08 DIAGNOSIS — O0990 Supervision of high risk pregnancy, unspecified, unspecified trimester: Secondary | ICD-10-CM

## 2023-09-08 MED ORDER — ONDANSETRON HCL 4 MG PO TABS: 8.0000 mg | ORAL_TABLET | Freq: Two times a day (BID) | ORAL | 0 refills | Status: DC

## 2023-09-08 NOTE — Progress Notes (Signed)
 Saturday had vomiting. Not feeling well today. No fever noted. Tues at [facility] for check. Not dilated at that time. Says baby not as active as usual X 2 days.

## 2023-09-08 NOTE — Progress Notes (Signed)
 PRENATAL VISIT NOTE  Subjective:  Melanie Strong is a 38 y.o. [phone] at [redacted]w[redacted]d being seen today for ongoing prenatal care.  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  Patient reports nausea, vomiting, and states she has generally not been feeling well. She has been taking her Metformin  as directed but had not been able to eat since Friday so she states her BS have been low  and she last recorded BS was 80. She reported only 1 episode of Vomiting.  Denies leaking of fluid, CTX, pelvic pain and feels good FM's  The following portions of the patient's history were reviewed and updated as appropriate: allergies, current medications, past family history, past medical history, past social history, past surgical history and problem list.   Objective:   Vitals:   09/08/23 1325  BP: 128/70  Pulse: 90  Weight: 193 lb (87.5 kg)    Fetal Status: Fetal Heart Rate (bpm): 152   Movement: Present     General:  Alert, oriented and cooperative. Patient is in no acute distress.  Skin: Skin is warm and dry. No rash noted.   Cardiovascular: Normal heart rate noted  Respiratory: Normal respiratory effort, no problems with respiration noted  Abdomen: Soft, gravid, appropriate for gestational age.  Pain/Pressure: Present     Pelvic: Cervical exam deferred        Extremities: Normal range of motion.  Edema: Trace  Mental Status: Normal mood and affect. Normal behavior. Normal judgment and thought content.   Assessment and  Plan:  Pregnancy: [id number] at [redacted]w[redacted]d  1. Supervision of high risk pregnancy, antepartum (Primary)   2. Gestational diabetes mellitus (GDM) in third trimester controlled on oral hypoglycemic drug  Patient Blood Sugar log reviewed by me  Fasting's  5 were > 90  PP: 5 were > 120 with 2 outliers (187 and 212) - No Blood sugars were recorded the past 3 days (not eating and   nausea) -Last noted BS was 80  (DW Dr Alger - Will hold Metformin  until patient starts eating again, continue BS monitoring, restart Metformin  when tolerating PO Food /Fluids) -Zofran  RX sent - Recheck BS log @ [facility] (Friday 1/10) - Strict precautions reviewed and when to report to MAU for further evaluation if s/s persist or any concerns  - CBC - Comp Met (CMET) - Urinalysis, Routine w reflex microscopic - Amylase - Lipase - Protein / creatinine ratio, urine  3. [redacted] weeks gestation of pregnancy    Preterm labor symptoms and general obstetric precautions including but not limited to vaginal bleeding, contractions, leaking of fluid and fetal movement were reviewed in detail with the patient. Please refer to After Visit Summary for other counseling recommendations.    Return in about 2 weeks (around 09/22/2023) for [facility] + GBS.  Future Appointments  Date Time Provider Department Center  09/12/2023  8:35 AM Starla Harland BROCKS, MD [facility] None  09/12/2023  9:20 AM [facility] NURSE [facility] None  09/12/2023  9:45 AM [facility] LAB [facility] None  09/22/2023  1:30 PM Leftwich-Kirby, Olam LABOR, CNM [facility] None  09/25/2023 10:15 AM [facility]  NURSE [facility]  09/25/2023 10:30 AM [facility] US4 [facility]  09/29/2023  1:50 PM Leftwich-Kirby, Olam LABOR, CNM [facility] None  10/06/2023  2:30 PM Leftwich-Kirby, Olam LABOR, CNM [facility] None  10/14/2023  2:30 PM Starla Harland BROCKS, MD [facility] None  10/20/2023  1:50 PM Leftwich-Kirby, Olam LABOR, CNM [facility] None  11/26/2023 10:30 AM Sater, Charlie LABOR, MD [facility] None   Olam Dalton, MSN, [facility],  Center for [facility]

## 2023-09-09 LAB — COMPREHENSIVE METABOLIC PANEL
ALT: 8 [IU]/L (ref 0–32)
AST: 12 [IU]/L (ref 0–40)
Albumin: 4 g/dL (ref 3.9–4.9)
Alkaline Phosphatase: 160 [IU]/L — ABNORMAL HIGH (ref 44–121)
BUN/Creatinine Ratio: 4 — ABNORMAL LOW (ref 9–23)
BUN: 2 mg/dL — ABNORMAL LOW (ref 6–20)
Bilirubin Total: 0.2 mg/dL (ref 0.0–1.2)
CO2: 20 mmol/L (ref 20–29)
Calcium: 9.1 mg/dL (ref 8.7–10.2)
Chloride: 106 mmol/L (ref 96–106)
Creatinine, Ser: 0.57 mg/dL (ref 0.57–1.00)
Globulin, Total: 2.8 g/dL (ref 1.5–4.5)
Glucose: 84 mg/dL (ref 70–99)
Potassium: 4.2 mmol/L (ref 3.5–5.2)
Sodium: 139 mmol/L (ref 134–144)
Total Protein: 6.8 g/dL (ref 6.0–8.5)
eGFR: 121 mL/min/{1.73_m2} (ref >59)

## 2023-09-09 LAB — URINALYSIS, ROUTINE W REFLEX MICROSCOPIC
Bilirubin, UA: NEGATIVE
Glucose, UA: NEGATIVE
Ketones, UA: NEGATIVE
Leukocytes,UA: NEGATIVE
Nitrite, UA: NEGATIVE
Protein,UA: NEGATIVE
RBC, UA: NEGATIVE
Specific Gravity, UA: 1.014 (ref 1.005–1.030)
Urobilinogen, Ur: 1 mg/dL (ref 0.2–1.0)
pH, UA: 6.5 (ref 5.0–7.5)

## 2023-09-09 LAB — CBC
Hematocrit: 33.7 % — ABNORMAL LOW (ref 34.0–46.6)
Hemoglobin: 11 g/dL — ABNORMAL LOW (ref 11.1–15.9)
MCH: 27 pg (ref 26.6–33.0)
MCHC: 32.6 g/dL (ref 31.5–35.7)
MCV: 83 fL (ref 79–97)
Platelets: 179 10*3/uL (ref 150–450)
RBC: 4.08 x10E6/uL (ref 3.77–5.28)
RDW: 14.4 % (ref 11.7–15.4)
WBC: 7.3 10*3/uL (ref 3.4–10.8)

## 2023-09-09 LAB — AMYLASE: Amylase: 78 U/L (ref 31–110)

## 2023-09-09 LAB — PROTEIN / CREATININE RATIO, URINE
Creatinine, Urine: 72.7 mg/dL
Protein, Ur: 14.4 mg/dL
Protein/Creat Ratio: 198 mg/g{creat} (ref 0–200)

## 2023-09-09 LAB — LIPASE: Lipase: 38 U/L (ref 14–72)

## 2023-09-11 ENCOUNTER — Telehealth: Payer: Self-pay

## 2023-09-11 NOTE — Telephone Encounter (Signed)
 S/w pt after babyscripts alert of BP 147/83 and headache. Pt took BP on phone and reading was 139/78, pulse 89. Pt states that she has not taken anything for the HA, advised to take Tylenol  and if symptoms are not relived in addition to new symptoms occurring be evaluated at the hospital, pt agreed. Pt also has appt tomorrow morning in the office.

## 2023-09-12 ENCOUNTER — Ambulatory Visit: Payer: Medicaid Other

## 2023-09-12 ENCOUNTER — Other Ambulatory Visit: Payer: Self-pay

## 2023-09-12 ENCOUNTER — Encounter: Payer: Self-pay | Admitting: Obstetrics & Gynecology

## 2023-09-12 ENCOUNTER — Encounter (HOSPITAL_COMMUNITY): Payer: Self-pay | Admitting: Obstetrics and Gynecology

## 2023-09-12 ENCOUNTER — Ambulatory Visit (INDEPENDENT_AMBULATORY_CARE_PROVIDER_SITE_OTHER): Payer: Medicaid Other | Admitting: Obstetrics & Gynecology

## 2023-09-12 ENCOUNTER — Other Ambulatory Visit: Payer: Medicaid Other

## 2023-09-12 ENCOUNTER — Inpatient Hospital Stay (HOSPITAL_COMMUNITY)
Admission: AD | Admit: 2023-09-12 | Discharge: 2023-09-12 | Disposition: A | Discharge status: Home / Self Care | Payer: Medicaid Other | Attending: Obstetrics and Gynecology | Admitting: Obstetrics and Gynecology

## 2023-09-12 VITALS — BP 108/62 | HR 89 | Wt 192.2 lb

## 2023-09-12 DIAGNOSIS — Z3A34 34 weeks gestation of pregnancy: Secondary | ICD-10-CM | POA: Diagnosis not present

## 2023-09-12 DIAGNOSIS — O99333 Smoking (tobacco) complicating pregnancy, third trimester: Secondary | ICD-10-CM | POA: Diagnosis not present

## 2023-09-12 DIAGNOSIS — O26893 Other specified pregnancy related conditions, third trimester: Secondary | ICD-10-CM | POA: Diagnosis not present

## 2023-09-12 DIAGNOSIS — R519 Headache, unspecified: Secondary | ICD-10-CM | POA: Insufficient documentation

## 2023-09-12 DIAGNOSIS — O09523 Supervision of elderly multigravida, third trimester: Secondary | ICD-10-CM

## 2023-09-12 DIAGNOSIS — F17210 Nicotine dependence, cigarettes, uncomplicated: Secondary | ICD-10-CM | POA: Diagnosis not present

## 2023-09-12 DIAGNOSIS — O0990 Supervision of high risk pregnancy, unspecified, unspecified trimester: Secondary | ICD-10-CM

## 2023-09-12 DIAGNOSIS — R1013 Epigastric pain: Secondary | ICD-10-CM | POA: Diagnosis present

## 2023-09-12 DIAGNOSIS — O34219 Maternal care for unspecified type scar from previous cesarean delivery: Secondary | ICD-10-CM

## 2023-09-12 DIAGNOSIS — H539 Unspecified visual disturbance: Secondary | ICD-10-CM | POA: Diagnosis not present

## 2023-09-12 DIAGNOSIS — Z98891 History of uterine scar from previous surgery: Secondary | ICD-10-CM

## 2023-09-12 DIAGNOSIS — O24415 Gestational diabetes mellitus in pregnancy, controlled by oral hypoglycemic drugs: Secondary | ICD-10-CM

## 2023-09-12 DIAGNOSIS — Z8619 Personal history of other infectious and parasitic diseases: Secondary | ICD-10-CM

## 2023-09-12 DIAGNOSIS — Z148 Genetic carrier of other disease: Secondary | ICD-10-CM

## 2023-09-12 DIAGNOSIS — D563 Thalassemia minor: Secondary | ICD-10-CM

## 2023-09-12 DIAGNOSIS — O99113 Other diseases of the blood and blood-forming organs and certain disorders involving the immune mechanism complicating pregnancy, third trimester: Secondary | ICD-10-CM

## 2023-09-12 LAB — CBC WITH DIFFERENTIAL/PLATELET
Abs Immature Granulocytes: 0.21 10*3/uL — ABNORMAL HIGH (ref 0.00–0.07)
Basophils Absolute: 0 10*3/uL (ref 0.0–0.1)
Basophils Relative: 0 %
Eosinophils Absolute: 0.1 10*3/uL (ref 0.0–0.5)
Eosinophils Relative: 1 %
HCT: 32.3 % — ABNORMAL LOW (ref 36.0–46.0)
Hemoglobin: 10.5 g/dL — ABNORMAL LOW (ref 12.0–15.0)
Immature Granulocytes: 3 %
Lymphocytes Relative: 25 %
Lymphs Abs: 2 10*3/uL (ref 0.7–4.0)
MCH: 27.1 pg (ref 26.0–34.0)
MCHC: 32.5 g/dL (ref 30.0–36.0)
MCV: 83.5 fL (ref 80.0–100.0)
Monocytes Absolute: 0.7 10*3/uL (ref 0.1–1.0)
Monocytes Relative: 8 %
Neutro Abs: 5 10*3/uL (ref 1.7–7.7)
Neutrophils Relative %: 63 %
Platelets: 172 10*3/uL (ref 150–400)
RBC: 3.87 MIL/uL (ref 3.87–5.11)
RDW: 15.1 % (ref 11.5–15.5)
WBC: 8 10*3/uL (ref 4.0–10.5)
nRBC: 0 % (ref 0.0–0.2)

## 2023-09-12 LAB — URINALYSIS, ROUTINE W REFLEX MICROSCOPIC
Bilirubin Urine: NEGATIVE
Glucose, UA: NEGATIVE mg/dL
Hgb urine dipstick: NEGATIVE
Ketones, ur: NEGATIVE mg/dL
Leukocytes,Ua: NEGATIVE
Nitrite: NEGATIVE
Protein, ur: NEGATIVE mg/dL
Specific Gravity, Urine: 1.006 (ref 1.005–1.030)
pH: 7 (ref 5.0–8.0)

## 2023-09-12 LAB — COMPREHENSIVE METABOLIC PANEL
ALT: 10 U/L (ref 0–44)
AST: 16 U/L (ref 15–41)
Albumin: 2.9 g/dL — ABNORMAL LOW (ref 3.5–5.0)
Alkaline Phosphatase: 132 U/L — ABNORMAL HIGH (ref 38–126)
Anion gap: 10 (ref 5–15)
BUN: <5 mg/dL — ABNORMAL LOW (ref 6–20)
CO2: 20 mmol/L — ABNORMAL LOW (ref 22–32)
Calcium: 9 mg/dL (ref 8.9–10.3)
Chloride: 106 mmol/L (ref 98–111)
Creatinine, Ser: 0.61 mg/dL (ref 0.44–1.00)
GFR, Estimated: >60 mL/min (ref >60)
Glucose, Bld: 110 mg/dL — ABNORMAL HIGH (ref 70–99)
Potassium: 3.6 mmol/L (ref 3.5–5.1)
Sodium: 136 mmol/L (ref 135–145)
Total Bilirubin: 0.6 mg/dL (ref 0.0–1.2)
Total Protein: 6.4 g/dL — ABNORMAL LOW (ref 6.5–8.1)

## 2023-09-12 LAB — PROTEIN / CREATININE RATIO, URINE
Creatinine, Urine: 46 mg/dL
Protein Creatinine Ratio: 0.15 mg/mg{creat} (ref 0.00–0.15)
Total Protein, Urine: 7 mg/dL

## 2023-09-12 MED ORDER — BUTALBITAL-APAP-CAFFEINE 50-325-40 MG PO TABS
1.0000 | ORAL_TABLET | Freq: Once | ORAL | Status: AC
Start: 2023-09-12 — End: 2023-09-12
  Administered 2023-09-12: 1 via ORAL
  Filled 2023-09-12: qty 1

## 2023-09-12 NOTE — Progress Notes (Signed)
 PRENATAL VISIT NOTE  Subjective:  Melanie Strong is a 38 y.o. [id number] at [redacted]w[redacted]d being seen today for ongoing prenatal care.  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  Patient reports  a headache for 2 days, did not improve with Tylenol  taken yesterday. She also report epigastric discomfort for at least a few weeks feels like someone is hitting me. She also reports visual disturbances for a few weeks. She was seen several days ago and her labs were essentially normal although alk phos increased significantly. Her BP today if great and her reflexs are fine. She has hand swelling but no swelling above her feet She reports good FM and NST is reassurring .  Contractions: Irritability. Vag. Bleeding: None.  Movement: Present. Denies leaking of fluid.   The following portions of the patient's history were reviewed and updated as appropriate: allergies, current medications, past family history, past medical history, past social history, past surgical history and problem list.   Objective:   Vitals:   09/12/23 0840  BP: 108/62  Pulse: 89  Weight: 192 lb 3.2 oz (87.2 kg)    Fetal Status:     Movement: Present     General:  Alert, oriented and cooperative. Patient is in no acute distress.  Skin: Skin is warm and dry. No rash noted.   Cardiovascular: Normal heart rate noted  Respiratory: Normal respiratory effort, no problems with respiration noted  Abdomen: Soft, gravid, appropriate for gestational age.   Pain/Pressure: Present     Pelvic: Cervical exam deferred        Extremities: Normal range of motion.  Edema: Mild pitting, slight indentation  Mental Status: Normal mood and affect. Normal behavior. Normal judgment and thought content.   Assessment and Plan:  Pregnancy: [id number] at [redacted]w[redacted]d 1. Gestational diabetes mellitus (GDM) in third trimester controlled on oral hypoglycemic drug (Primary)  - Glucose Tolerance, 2 Hours w/1 Hour  - Her sugars recorded are generally within range. Fetal testing reassuring  2. [redacted] weeks gestation of pregnancy  - Glucose Tolerance, 2 Hours w/1 Hour  3. Alpha thalassemia silent carrier   4. Carrier of beta thalassemia   5. Carrier of spinal muscular atrophy   6. History of cesarean section - scheduled for RCS  7. History of ELISA positive for HSV   8. Supervision of high risk pregnancy, antepartum   9. Multigravida of advanced maternal age in third trimester - low risk Panorama  10. H/O Pre E and now with new onset headache not resolving with tylenol - she will be evaluated at the hospital.   Preterm labor symptoms and general obstetric precautions including but not limited to vaginal bleeding, contractions, leaking of fluid and fetal movement were reviewed in detail with the patient. Please refer to After Visit Summary for other counseling recommendations.   Return in about 1 week (around 09/19/2023) for ROB and GBS/cervical cultures.  Future Appointments  Date Time Provider Department Center  09/22/2023  1:30 PM Milly Olam LABOR, CNM [facility] None  09/25/2023 10:15 AM [facility] NURSE [facility]  09/25/2023 10:30 AM [facility] US[facility]  09/29/2023  1:50 PM Leftwich-Kirby, Olam LABOR, CNM [facility] None  10/06/2023  2:30 PM Leftwich-Kirby, Olam LABOR, CNM [facility] None  10/14/2023  2:30 PM Starla Harland BROCKS, MD [facility] None  10/20/2023  1:50 PM Leftwich-Kirby, Olam LABOR, CNM [facility] None  11/26/2023 10:30 AM Sater, Charlie LABOR, MD [facility] None    Harland BROCKS Starla, MD

## 2023-09-12 NOTE — MAU Note (Signed)
 Melanie Strong is a 38 y.o. at [redacted]w[redacted]d here in MAU reporting: she was sent from office for Pre Eclampsia evaluation.  Reports her BP was not elevated at office visit, but she's had a HA for 2 days and has visual disturbances.  Endorses epigastric pain, but points to center of epigastric region.  Endorses +FM, denies VB or LOF.  LMP: NA Onset of complaint: 2 days  Pain score: 7 Vitals:   09/12/23 1102  BP: 109/68  Pulse: 90  Resp: 18  Temp: 98 F (36.7 C)  SpO2: 99%     FHT:144 bpm Lab orders placed from triage: UA

## 2023-09-12 NOTE — MAU Provider Note (Signed)
 History     CSN: [id number]  Arrival date and time: 09/12/23 1030   Event Date/Time   First Provider Initiated Contact with Patient 09/12/23 1201      Chief Complaint  Patient presents with   Headache   Visual Disturbances   HPI [name] is a 38 y.o. [id number] at [redacted]w[redacted]d who presents to MAU today with complaint of concerns for pre-eclampsia. She was sent from the office for further evaluation. She has had a headache for 2 days unrelieved by Tylenol . She rates her pain at 7/10 now. She has had epigastric pain that feels like a muscle pain. She denies contractions now, but has had some off and on over the last few days. She reports +FM.    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   Gestational diabetes    History of ileus    Pregnancy induced hypertension     Past Surgical History:  Procedure Laterality Date   CESAREAN SECTION  08/23/2021   Procedure: CESAREAN SECTION;  Surgeon: Eldonna Suzen Octave, MD;  [facility]: [facility];  Service: Obstetrics;;   CESAREAN SECTION N/A 10/04/2022   Procedure: CESAREAN SECTION;  Surgeon: Eveline Lynwood MATSU, MD;  Location: [facility];  Service: Obstetrics;  Laterality: N/A;    Family History  Problem Relation Age of Onset   Hypertension Mother    Diabetes Mother        Boarderline   Lupus Mother        2nd opinion says no/ unsure   Cancer Father        rectal   Sickle cell anemia Sister    Healthy Brother    Healthy Brother    Cancer Paternal Aunt        brain   Cancer Paternal Uncle        stomach   Diabetes Maternal Grandmother    Other Neg Hx    Asthma Neg Hx    Heart disease Neg Hx    Stroke Neg Hx     Social History   Tobacco Use   Smoking status:  Some Days    Current packs/day: 0.25    Average packs/day: 0.3 packs/day for 10.0 years (2.5 ttl pk-yrs)    Types: Cigarettes    Passive exposure: Never   Smokeless tobacco: Never  Vaping Use   Vaping status: Never Used  Substance Use Topics   Alcohol use: No    Comment: rare   Drug use: No    Allergies: No Known Allergies  Medications Prior to Admission  Medication Sig Dispense Refill Last Dose/Taking   aspirin  EC 81 MG tablet Take 1 tablet (81 mg total) by mouth daily. Take after 12 weeks for prevention of preeclampsia later in pregnancy 300 tablet 2 09/12/2023   lidocaine  (XYLOCAINE ) 5 % ointment Apply 1 Application topically as needed. 35.44 g 1 09/11/2023   metFORMIN  (GLUCOPHAGE ) 500 MG tablet Take 2 tablets (1,000 mg total) by mouth 2 (  two) times daily with a meal. 120 tablet 3 09/11/2023   ondansetron  (ZOFRAN ) 4 MG tablet Take 2 tablets (8 mg total) by mouth 2 (two) times daily. 20 tablet 0 09/10/2023   Prenatal Vit-Fe Fumarate-FA (MULTIVITAMIN-PRENATAL) 27-0.8 MG TABS tablet Take 1 tablet by mouth daily before breakfast. 30 tablet 12 09/12/2023   Accu-Chek Softclix Lancets lancets 100 each by Other route 4 (four) times daily. 100 each 12    Blood Glucose Monitoring Suppl (ACCU-CHEK GUIDE) w/Device KIT 1 kit by Does not apply route daily. 1 kit 0    Blood Pressure Monitoring (BLOOD PRESSURE KIT) DEVI 1 kit by Does not apply route once a week. Check Blood Pressure regularly and record readings into the [employer] App.  Large Cuff.  DX O90.0 1 each 0    gabapentin  (NEURONTIN ) 300 MG capsule Take 1 capsule (300 mg total) by mouth 2 (two) times daily as needed. 60 capsule 2 More than a month   glucose blood (ACCU-CHEK GUIDE TEST) test strip Use four times daily to check blood sugar fasting and 2 hours post meal 200 each 12    NIFEdipine  (ADALAT  CC) 30 MG 24 hr tablet Take 30 mg by mouth daily. (Patient not taking: Reported on 09/12/2023)       Review of Systems  Constitutional:  Negative  for fever.  Cardiovascular:  Negative for leg swelling.  Gastrointestinal:  Positive for abdominal pain. Negative for constipation, diarrhea, nausea and vomiting.  Genitourinary:  Negative for pelvic pain, vaginal bleeding and vaginal discharge.  Neurological:  Positive for headaches.   Physical Exam   Blood pressure 115/72, pulse 89, temperature 98 F (36.7 C), temperature source Oral, resp. rate 18, height 5' 5 (1.651 m), weight 86.9 kg, last menstrual period 01/11/2023, SpO2 100%, currently breastfeeding.  Physical Exam Vitals and nursing note reviewed.  Constitutional:      General: She is not in acute distress.    Appearance: She is normal weight.  Cardiovascular:     Rate and Rhythm: Normal rate.  Pulmonary:     Effort: Pulmonary effort is normal.  Abdominal:     General: There is no distension.     Palpations: Abdomen is soft. There is no mass.     Tenderness: There is abdominal tenderness (mild epigastric tenderness to palpation). There is no guarding.  Skin:    General: Skin is warm and dry.     Findings: No erythema.  Neurological:     Mental Status: She is alert and oriented to person, place, and time.     Deep Tendon Reflexes: Reflexes normal.  Psychiatric:        Mood and Affect: Mood normal.    Results for orders placed or performed during the hospital encounter of 09/12/23 (from the past 24 hours)  Urinalysis, Routine w reflex microscopic -Urine, Clean Catch     Status: None   Collection Time: 09/12/23 12:11 PM  Result Value Ref Range   Color, Urine YELLOW YELLOW   APPearance CLEAR CLEAR   Specific Gravity, Urine 1.006 1.005 - 1.030   pH 7.0 5.0 - 8.0   Glucose, UA NEGATIVE NEGATIVE mg/dL   Hgb urine dipstick NEGATIVE NEGATIVE   Bilirubin Urine NEGATIVE NEGATIVE   Ketones, ur NEGATIVE NEGATIVE mg/dL   Protein, ur NEGATIVE NEGATIVE mg/dL   Nitrite NEGATIVE NEGATIVE   Leukocytes,Ua NEGATIVE NEGATIVE  CBC with Differential/Platelet     Status: Abnormal    Collection Time: 09/12/23 12:25 PM  Result Value Ref Range  WBC 8.0 4.0 - 10.5 K/uL   RBC 3.87 3.87 - 5.11 MIL/uL   Hemoglobin 10.5 (L) 12.0 - 15.0 g/dL   HCT 67.6 (L) 63.9 - 53.9 %   MCV 83.5 80.0 - 100.0 fL   MCH 27.1 26.0 - 34.0 pg   MCHC 32.5 30.0 - 36.0 g/dL   RDW 84.8 88.4 - 84.4 %   Platelets 172 150 - 400 K/uL   nRBC 0.0 0.0 - 0.2 %   Neutrophils Relative % 63 %   Neutro Abs 5.0 1.7 - 7.7 K/uL   Lymphocytes Relative 25 %   Lymphs Abs 2.0 0.7 - 4.0 K/uL   Monocytes Relative 8 %   Monocytes Absolute 0.7 0.1 - 1.0 K/uL   Eosinophils Relative 1 %   Eosinophils Absolute 0.1 0.0 - 0.5 K/uL   Basophils Relative 0 %   Basophils Absolute 0.0 0.0 - 0.1 K/uL   Immature Granulocytes 3 %   Abs Immature Granulocytes 0.21 (H) 0.00 - 0.07 K/uL  Comprehensive metabolic panel     Status: Abnormal   Collection Time: 09/12/23 12:25 PM  Result Value Ref Range   Sodium 136 135 - 145 mmol/L   Potassium 3.6 3.5 - 5.1 mmol/L   Chloride 106 98 - 111 mmol/L   CO2 20 (L) 22 - 32 mmol/L   Glucose, Bld 110 (H) 70 - 99 mg/dL   BUN <5 (L) 6 - 20 mg/dL   Creatinine, Ser 9.38 0.44 - 1.00 mg/dL   Calcium 9.0 8.9 - 89.6 mg/dL   Total Protein 6.4 (L) 6.5 - 8.1 g/dL   Albumin 2.9 (L) 3.5 - 5.0 g/dL   AST 16 15 - 41 U/L   ALT 10 0 - 44 U/L   Alkaline Phosphatase 132 (H) 38 - 126 U/L   Total Bilirubin 0.6 0.0 - 1.2 mg/dL   GFR, Estimated >39 >39 mL/min   Anion gap 10 5 - 15  Protein / creatinine ratio, urine     Status: None   Collection Time: 09/12/23 12:55 PM  Result Value Ref Range   Creatinine, Urine 46 mg/dL   Total Protein, Urine 7 mg/dL   Protein Creatinine Ratio 0.15 0.00 - 0.15 mg/mg[Cre]   Patient Vitals for the past 24 hrs:  BP Temp Temp src Pulse Resp SpO2 Height Weight  09/12/23 1315 117/76 -- -- 82 -- -- -- --  09/12/23 1254 116/64 -- -- 80 -- 100 % -- --  09/12/23 1200 -- -- -- -- -- 99 % -- --  09/12/23 1145 112/72 -- -- 81 -- 100 % -- --  09/12/23 1130 115/72 -- -- 89 -- 100  % -- --  09/12/23 1114 117/70 -- -- 92 -- 100 % -- --  09/12/23 1102 109/68 98 F (36.7 C) Oral 90 18 99 % -- --  09/12/23 1049 -- -- -- -- -- -- 5' 5 (1.651 m) 86.9 kg      MAU Course  Procedures None  MDM Serial BPs CBC, CMP, Protein/Creatinine ratio, UA today  All values normotensive. Labs without evidence of pre-eclampsia  Fioricet  given for headache.   Assessment and Plan  A: SIUP at [redacted]w[redacted]d Headache  Epigastric pain  P:  Discharge home Tylenol  PRN for pain  Pre-eclampsia precautions discussed Patient advised to follow-up with [facility] as scheduled or sooner PRN Patient may return to MAU as needed or if her condition were to change or worsen   Mliss Rinne, PA-C 09/12/2023, 12:01 PM

## 2023-09-12 NOTE — Progress Notes (Signed)
 Pt presents for ROB visit. Pt c/o headaches for 2 day

## 2023-09-22 ENCOUNTER — Other Ambulatory Visit (HOSPITAL_COMMUNITY)
Admission: RE | Admit: 2023-09-22 | Discharge: 2023-09-22 | Disposition: A | Discharge status: Home / Self Care | Payer: Medicaid Other | Source: Ambulatory Visit | Attending: Advanced Practice Midwife | Admitting: Advanced Practice Midwife

## 2023-09-22 ENCOUNTER — Ambulatory Visit (INDEPENDENT_AMBULATORY_CARE_PROVIDER_SITE_OTHER): Payer: Medicaid Other | Admitting: Advanced Practice Midwife

## 2023-09-22 VITALS — BP 116/67 | HR 86 | Wt 193.0 lb

## 2023-09-22 DIAGNOSIS — O09299 Supervision of pregnancy with other poor reproductive or obstetric history, unspecified trimester: Secondary | ICD-10-CM

## 2023-09-22 DIAGNOSIS — Z3A36 36 weeks gestation of pregnancy: Secondary | ICD-10-CM | POA: Insufficient documentation

## 2023-09-22 DIAGNOSIS — O09523 Supervision of elderly multigravida, third trimester: Secondary | ICD-10-CM | POA: Diagnosis not present

## 2023-09-22 DIAGNOSIS — D563 Thalassemia minor: Secondary | ICD-10-CM

## 2023-09-22 DIAGNOSIS — Z98891 History of uterine scar from previous surgery: Secondary | ICD-10-CM | POA: Insufficient documentation

## 2023-09-22 DIAGNOSIS — O24415 Gestational diabetes mellitus in pregnancy, controlled by oral hypoglycemic drugs: Secondary | ICD-10-CM | POA: Insufficient documentation

## 2023-09-22 DIAGNOSIS — O0990 Supervision of high risk pregnancy, unspecified, unspecified trimester: Secondary | ICD-10-CM

## 2023-09-22 NOTE — Progress Notes (Signed)
PRENATAL VISIT NOTE  Subjective:  Melanie Strong is a 38 y.o. [phone] at [redacted]w[redacted]d being seen today for ongoing prenatal care.  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  Patient reports occasional contractions.  Contractions: Irregular. Vag. Bleeding: None.  Movement: Present. Denies leaking of fluid.   The following portions of the patient's history were reviewed and updated as appropriate: allergies, current medications, past family history, past medical history, past social history, past surgical history and problem list.   Objective:   Vitals:   09/22/23 1325  BP: 116/67  Pulse: 86  Weight: 193 lb (87.5 kg)    Fetal Status: Fetal Heart Rate (bpm): 149 Fundal Height: 36 cm Movement: Present     General:  Alert, oriented and cooperative. Patient is in no acute distress.  Skin: Skin is warm and dry. No rash noted.   Cardiovascular: Normal heart rate noted  Respiratory: Normal respiratory effort, no problems with respiration noted  Abdomen: Soft, gravid, appropriate for gestational age.  Pain/Pressure: Present     Pelvic: Cervical exam performed in the presence of a chaperone Dilation: Closed Effacement (%): Thick Station: Ballotable  Extremities: Normal range of motion.  Edema: Trace  Mental Status: Normal mood and affect. Normal behavior. Normal judgment and thought content.   Assessment and Plan:  Pregnancy: [id number] at [redacted]w[redacted]d There are no diagnoses linked to this encounter. Preterm labor symptoms  and general obstetric precautions including but not limited to vaginal bleeding, contractions, leaking of fluid and fetal movement were reviewed in detail with the patient. Please refer to After Visit Summary for other counseling recommendations.   Return in about 1 week (around 09/29/2023) for [facility].  Future Appointments  Date Time Provider Department Center  09/25/2023 10:15 AM [facility] NURSE [facility]  09/25/2023 10:30 AM [facility]  09/29/2023  1:50 PM Leftwich-Kirby, Wilmer Floor, CNM [facility] None  10/06/2023  2:30 PM Leftwich-Kirby, Wilmer Floor, CNM [facility] None  10/14/2023  2:30 PM Allie Bossier, MD [facility] None  10/20/2023  1:50 PM Leftwich-Kirby, Wilmer Floor, CNM [facility] None  11/26/2023 10:30 AM Sater, Pearletha Furl, MD [facility] None    1. Gestational diabetes mellitus (GDM) in third trimester controlled on oral hypoglycemic drug (Primary) - GDM A2 Metformin 1000 BID  - BS log reviewed  ( FS b/w 90-102) 2 hr PP >50% are above 130 with 1 outlier of 312 on 09/19/23   - Cervicovaginal ancillary only( [place]) - Antenatal testing in progress - next appointment 1/23 - Growth Sono AGA  - Est. FW: 1311 gm 2 lb 14 oz 63 % on 07/28/23  2. Hx of preeclampsia, prior pregnancy, currently pregnant bASA  3. Multigravida of advanced maternal age in third trimester   4. History of cesarean section -Desires repeat C/S no desire to TOLAC - Cervicovaginal ancillary only( [place])   6. Supervision of high risk pregnancy, antepartum   7. Alpha thalassemia silent carrier FOB testing drawn 8/17-he is negative for alpha and beta thal as well as CF and SMA.   8. [redacted] weeks gestation of pregnancy  - Culture,  beta strep (group b only) - Cervicovaginal ancillary only( [place])    Marcell Barlow, MSN, [facility], Center for [employer]

## 2023-09-22 NOTE — Progress Notes (Signed)
Pt presents for ROB visit. Pt c/o contractions. Requesting cervical check

## 2023-09-23 LAB — CERVICOVAGINAL ANCILLARY ONLY
Chlamydia: NEGATIVE
Comment: NEGATIVE
Comment: NEGATIVE
Comment: NORMAL
Neisseria Gonorrhea: NEGATIVE
Trichomonas: NEGATIVE

## 2023-09-25 ENCOUNTER — Ambulatory Visit: Payer: Medicaid Other | Attending: Obstetrics & Gynecology | Admitting: Obstetrics and Gynecology

## 2023-09-25 ENCOUNTER — Ambulatory Visit: Payer: Medicaid Other | Attending: Obstetrics | Admitting: *Deleted

## 2023-09-25 ENCOUNTER — Other Ambulatory Visit: Payer: Self-pay | Admitting: Family Medicine

## 2023-09-25 ENCOUNTER — Ambulatory Visit (HOSPITAL_BASED_OUTPATIENT_CLINIC_OR_DEPARTMENT_OTHER): Payer: Medicaid Other

## 2023-09-25 ENCOUNTER — Other Ambulatory Visit: Payer: Self-pay

## 2023-09-25 VITALS — BP 118/68

## 2023-09-25 DIAGNOSIS — O24415 Gestational diabetes mellitus in pregnancy, controlled by oral hypoglycemic drugs: Secondary | ICD-10-CM | POA: Insufficient documentation

## 2023-09-25 DIAGNOSIS — N858 Other specified noninflammatory disorders of uterus: Secondary | ICD-10-CM | POA: Diagnosis not present

## 2023-09-25 DIAGNOSIS — O34219 Maternal care for unspecified type scar from previous cesarean delivery: Secondary | ICD-10-CM | POA: Diagnosis not present

## 2023-09-25 DIAGNOSIS — Z148 Genetic carrier of other disease: Secondary | ICD-10-CM | POA: Insufficient documentation

## 2023-09-25 DIAGNOSIS — O24419 Gestational diabetes mellitus in pregnancy, unspecified control: Secondary | ICD-10-CM

## 2023-09-25 DIAGNOSIS — Z3A36 36 weeks gestation of pregnancy: Secondary | ICD-10-CM

## 2023-09-25 DIAGNOSIS — O99013 Anemia complicating pregnancy, third trimester: Secondary | ICD-10-CM | POA: Diagnosis not present

## 2023-09-25 DIAGNOSIS — O34211 Maternal care for low transverse scar from previous cesarean delivery: Secondary | ICD-10-CM | POA: Diagnosis not present

## 2023-09-25 DIAGNOSIS — O09523 Supervision of elderly multigravida, third trimester: Secondary | ICD-10-CM

## 2023-09-25 DIAGNOSIS — O3663X0 Maternal care for excessive fetal growth, third trimester, not applicable or unspecified: Secondary | ICD-10-CM | POA: Insufficient documentation

## 2023-09-25 DIAGNOSIS — O09893 Supervision of other high risk pregnancies, third trimester: Secondary | ICD-10-CM | POA: Diagnosis not present

## 2023-09-25 DIAGNOSIS — Z98891 History of uterine scar from previous surgery: Secondary | ICD-10-CM

## 2023-09-25 DIAGNOSIS — D563 Thalassemia minor: Secondary | ICD-10-CM

## 2023-09-25 DIAGNOSIS — O358XX0 Maternal care for other (suspected) fetal abnormality and damage, not applicable or unspecified: Secondary | ICD-10-CM | POA: Diagnosis not present

## 2023-09-25 DIAGNOSIS — O0990 Supervision of high risk pregnancy, unspecified, unspecified trimester: Secondary | ICD-10-CM

## 2023-09-25 DIAGNOSIS — Z7984 Long term (current) use of oral hypoglycemic drugs: Secondary | ICD-10-CM | POA: Insufficient documentation

## 2023-09-25 NOTE — Progress Notes (Unsigned)
  Maternal-Fetal Medicine-Consultation    I had the pleasure of seeing [name] today at [facility]. She is G4 P2012 at 36w 5d gestation and is here for fetal growth assessment and BPP.  Her high risk problems include:  -Gestational diabetes.  Patient takes metformin 1000 mg twice daily.  I reviewed her blood glucose log.  Most of her fasting levels are increased between 110-120 mg/dL.  Most postprandial levels are within normal range.  She had one reading at 312 mg postprandial last week.   -History of 2 cesarean deliveries.  Patient is scheduled to undergo repeat cesarean delivery on 10/13/2023.  Ultrasound The estimated fetal weight is at the 93rd percentile and the abdominal circumference measurement at the 99 percentile.  Amniotic fluid is normal and good fetal activity seen.  Cephalic presentation.  Antenatal testing is reassuring.  BPP 8/8.  Placenta is anterior and not low-lying.  Myometrial thinning is observed in the lower part of the placenta but no lacunae are seen.  Although there is low suspicion for placenta accreta spectrum, focal accreta cannot be ruled out.  Our concerns include: Poorly controlled gestational diabetes I counseled the patient on blood glucose levels and that increased her fasting levels show poor control.  Complications of poorly controlled diabetes include stillbirth. I discussed the option of inpatient management for control.  Insulin may not be the appropriate choice at this gestational age as outpatient management. Patient does not want to get admitted. Because of an increased risk for stillbirth with poorly controlled diabetes, I recommend delivery at 37 to [redacted] weeks gestation. Patient agreed with my recommendations.  Previous cesarean deliveries I explained that previous cesarean delivery since increase the risk of placenta accreta spectrum.  Although the likelihood of passage is low, focal accreta may be evident only  at surgery.  If bleeding cannot be controlled, hysterectomy will be performed. Patient is aware that consent for hysterectomy will be taken. Patient desires to have more children.  I discussed the benefit of vaginal birth after cesarean delivery.  Trial of labor is associated with scar rupture and 1% to 2% of cases.  She had low transverse uterine incisions in both pregnancies. Patient would like to have elective cesarean delivery.  Recommendations -Delivery at 37 to [redacted] weeks gestation.  Message will be sent (in basket) to her Ob providers.  Patient has a prenatal visit appointment next week. -Anticipate focal placenta accreta spectrum and this is low likelihood).   -Consent for cesarean hysterectomy should be taken. -Blood should be available at short notice during delivery.   Thank you for consultation.  If you have any questions or concerns, please contact me the [facility].  Consultation including face-to-face (more than 50%) counseling 40 minutes.

## 2023-09-25 NOTE — Progress Notes (Signed)
  Maternal-Fetal Medicine-Consultation    I had the pleasure of seeing [name] today at the [facility]. She is G4 P2012 at 36w 5d gestation and is here for fetal growth assessment and BPP.  Her high risk problems include:  -Gestational diabetes.  Patient takes metformin 1000 mg twice daily.  I reviewed her blood glucose log.  Most of her fasting levels are increased between 110-120 mg/dL.  Most postprandial levels are within normal range.  She had one reading at 312 mg postprandial last week.   -History of 2 cesarean deliveries.  Patient is scheduled to undergo repeat cesarean delivery on 10/13/2023.  Ultrasound The estimated fetal weight is at the 93rd percentile and the abdominal circumference measurement at the 99 percentile.  Amniotic fluid is normal and good fetal activity seen.  Cephalic presentation.  Antenatal testing is reassuring.  BPP 8/8.  Placenta is anterior and not low-lying.  Myometrial thinning is observed in the lower part of the placenta but no lacunae are seen.  Although there is low suspicion for placenta accreta spectrum, focal accreta cannot be ruled out.  Our concerns include: Poorly controlled gestational diabetes I counseled the patient on blood glucose levels and that increased her fasting levels show poor control.  Complications of poorly controlled diabetes include stillbirth. I discussed the option of inpatient management for control.  Insulin may not be the appropriate choice at this gestational age as outpatient management. Patient does not want to get admitted. Because of an increased risk for stillbirth with poorly controlled diabetes, I recommend delivery at 37 to [redacted] weeks gestation. Patient agreed with my recommendations.  Previous cesarean deliveries I explained that previous cesarean delivery since increase the risk of placenta accreta spectrum.  Although the likelihood of passage is low, focal accreta may be evident only  at surgery.  If bleeding cannot be controlled, hysterectomy will be performed. Patient is aware that consent for hysterectomy will be taken. Patient desires to have more children.  I discussed the benefit of vaginal birth after cesarean delivery.  Trial of labor is associated with scar rupture and 1% to 2% of cases.  She had low transverse uterine incisions in both pregnancies. Patient would like to have elective cesarean delivery.  Recommendations -Delivery at 37 to [redacted] weeks gestation.  Message will be sent (in basket) to her Ob providers.  Patient has a prenatal visit appointment next week. -Anticipate focal placenta accreta spectrum and this is low likelihood).   -Consent for cesarean hysterectomy should be taken. -Blood should be available at short notice during delivery.   Thank you for consultation.  If you have any questions or concerns, please contact me the [facility].  Consultation including face-to-face (more than 50%) counseling 40 minutes.

## 2023-09-26 ENCOUNTER — Telehealth: Payer: Self-pay | Admitting: Obstetrics and Gynecology

## 2023-09-26 ENCOUNTER — Encounter: Payer: Self-pay | Admitting: Advanced Practice Midwife

## 2023-09-26 LAB — CULTURE, BETA STREP (GROUP B ONLY): Strep Gp B Culture: NEGATIVE

## 2023-09-26 NOTE — Telephone Encounter (Signed)
Called patient to discuss her request to reschedule CS. She has poorly controlled GDM with an LGA fetus, so delivery was recommended by MFM between 37-38wk.  Of note, she also has history of 2 prior CS with anterior placenta and myometrial thinning noted on [place] yesterday. No lacunae were seen, but accreta possible. MFM recommends consenting for c hyst with her scheduled case  She was scheduled for tomorrow at [redacted]w[redacted]d, but she does not have childcare. She initially requested 2/3 when she is [redacted]w[redacted]d because she wants to be able to celebrate her son's birthday. We discussed that this is not reasonable, but that I would recommend delivery within the 37th week if at all possible. Reviewed our reason for recommending delivery earlier is due to elevated risk of stillbirth with uncontrolled GDM and an LGA fetus. After discussion, she would still like to reschedule her CS, but is amenable to CS on 1/31 when she will have child care.  OR charge nurse called and case rescheduled.   Routing as FYI to first & second attending scheduled 1/31.   Harvie Bridge, MD Obstetrician & Gynecologist, [facility] for [employer], [facility]

## 2023-09-29 ENCOUNTER — Ambulatory Visit (INDEPENDENT_AMBULATORY_CARE_PROVIDER_SITE_OTHER): Payer: Medicaid Other | Admitting: Nurse Practitioner

## 2023-09-29 ENCOUNTER — Encounter: Payer: Self-pay | Admitting: Advanced Practice Midwife

## 2023-09-29 ENCOUNTER — Encounter (HOSPITAL_COMMUNITY): Payer: Self-pay

## 2023-09-29 VITALS — BP 130/74 | HR 96 | Wt 194.4 lb

## 2023-09-29 DIAGNOSIS — O24415 Gestational diabetes mellitus in pregnancy, controlled by oral hypoglycemic drugs: Secondary | ICD-10-CM

## 2023-09-29 DIAGNOSIS — O0990 Supervision of high risk pregnancy, unspecified, unspecified trimester: Secondary | ICD-10-CM

## 2023-09-29 DIAGNOSIS — Z3A37 37 weeks gestation of pregnancy: Secondary | ICD-10-CM

## 2023-09-29 DIAGNOSIS — O09523 Supervision of elderly multigravida, third trimester: Secondary | ICD-10-CM

## 2023-09-29 DIAGNOSIS — Z98891 History of uterine scar from previous surgery: Secondary | ICD-10-CM

## 2023-09-29 DIAGNOSIS — O09299 Supervision of pregnancy with other poor reproductive or obstetric history, unspecified trimester: Secondary | ICD-10-CM

## 2023-09-29 NOTE — Progress Notes (Signed)
PRENATAL VISIT NOTE  Subjective:  Melanie Strong is a 38 y.o. [phone] at [redacted]w[redacted]d being seen today for ongoing prenatal care.  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  Patient reports no complaints.  Contractions: Irritability. Vag. Bleeding: None.  Movement: Present. Denies leaking of fluid.   The following portions of the patient's history were reviewed and updated as appropriate: allergies, current medications, past family history, past medical history, past social history, past surgical history and problem list.   Objective:   Vitals:   09/29/23 1346  BP: 130/74  Pulse: 96  Weight: 194 lb 6.4 oz (88.2 kg)    Fetal Status: Fetal Heart Rate (bpm): 152   Movement: Present     General:  Alert, oriented and cooperative. Patient is in no acute distress.  Skin: Skin is warm and dry. No rash noted.   Cardiovascular: Normal heart rate noted  Respiratory: Normal respiratory effort, no problems with respiration noted  Abdomen: Soft, gravid, appropriate for gestational age.  Pain/Pressure: Present     Pelvic: Cervical exam deferred        Extremities: Normal range of motion.  Edema: Trace (hands)  Mental Status: Normal mood and affect. Normal behavior. Normal judgment and thought content.   Assessment and Plan:  Pregnancy: [id number] at [redacted]w[redacted]d  1. Gestational diabetes mellitus (GDM) in third trimester controlled on oral hypoglycemic drug (Primary) - Poorly controlled > 50 % of fasting are >90 - > then 50%  of pp > 130 - Per MFM recommendations on (09/25/23) patient is advised delivery b/w 37-[redacted] wks GA - Repeat C- Section has been scheduled for 10/03/23  - Presurgical testing has been arranged for 10/01/23 - Anticipatory guidance and educational counseling provided to the patient regarding poorly controlled GMA A 2 and the risks including but not limited to maternal fetal death. - Anticipatory guidance regarding possible NICU admission   - Advise out of work and I have approved her Short term disability  and FMLA to start today d/t anticipated rLTCS on 10/03/23 and poorly controlled GDM A2 in high risk pregnancy   - Patient verbalized understanding of all of the above discussed in detail - Encouraged tighter glycemic control   2. History of cesarean section - prior C/S x 2 - scheduled 10/03/23  3. [redacted] weeks gestation of pregnancy   4. Supervision of high risk pregnancy, antepartum   5. Hx of preeclampsia, prior pregnancy, currently pregnant    09/29/2023    2:07 PM 09/29/2023    1:46 PM 09/25/2023   10:11 AM  Vitals with BMI  Height 5\' 5"     Weight 191 lbs 194 lbs 6 oz   BMI 31.78 32.35   Systolic  130 118  Diastolic  74 68  Pulse  96     - bASA compliant  - BP's stable - Denies any s/s of PreE   6. Multigravida of advanced maternal age in third trimester   Term labor symptoms and general obstetric precautions including but not limited to vaginal bleeding, contractions, leaking of fluid and fetal movement were reviewed in detail with  the patient. Please refer to After Visit Summary for other counseling recommendations.   No follow-ups on file.  Future Appointments  Date Time Provider Department Center  10/01/2023 10:30 AM [facility] PAT 1 [facility] None  10/06/2023  2:30 PM Katrinka Blazing [place], CNM [facility] None  10/14/2023  2:30 PM Allie Bossier, MD [facility] None  10/20/2023  1:50 PM Leftwich-Kirby, Wilmer Floor, CNM [facility] None  11/26/2023 10:30 AM Sater, Pearletha Furl, MD [facility] None    Marcell Barlow, MSN, [facility], Center for [facility]

## 2023-09-29 NOTE — Patient Instructions (Addendum)
Melanie Strong  09/29/2023   Your procedure is scheduled on:  10/03/2023  Arrive at 1230 at Entrance C on [employer] at [facility]  and [employer]. You are invited to use the FREE valet parking or use the Visitor's parking deck.  Pick up the phone at the desk and dial [phone].  Strong this number if you have problems the morning of surgery: [phone]  Remember:   Do not eat food:(After Midnight) Desps de medianoche.  You may drink clear liquids until arrival at __1230___.  Clear liquids means a liquid you can see thru.  It can have color such as Cola or Kool aid.  Tea is OK and coffee as long as no milk or creamer of any kind.  Take these medicines the morning of surgery with A SIP OF WATER:  none   Do not wear jewelry, make-up or nail polish.  Do not wear lotions, powders, or perfumes. Do not wear deodorant.  Do not shave 48 hours prior to surgery.  Do not bring valuables to the hospital.  [facility] is not   responsible for any belongings or valuables brought to the hospital.  Contacts, dentures or bridgework may not be worn into surgery.  Leave suitcase in the car. After surgery it may be brought to your room.  For patients admitted to the hospital, checkout time is 11:00 AM the day of              discharge.      Please read over the following fact sheets that you were given:     Preparing for Surgery

## 2023-09-30 ENCOUNTER — Encounter: Payer: Self-pay | Admitting: *Deleted

## 2023-10-01 ENCOUNTER — Encounter (HOSPITAL_COMMUNITY): Payer: Self-pay | Admitting: Obstetrics and Gynecology

## 2023-10-01 ENCOUNTER — Encounter (HOSPITAL_COMMUNITY)
Admission: RE | Admit: 2023-10-01 | Discharge: 2023-10-01 | Disposition: A | Discharge status: Home / Self Care | Payer: Medicaid Other | Source: Ambulatory Visit | Attending: Obstetrics and Gynecology | Admitting: Obstetrics and Gynecology

## 2023-10-01 LAB — CBC
HCT: 33.1 % — ABNORMAL LOW (ref 36.0–46.0)
Hemoglobin: 10.7 g/dL — ABNORMAL LOW (ref 12.0–15.0)
MCH: 27.2 pg (ref 26.0–34.0)
MCHC: 32.3 g/dL (ref 30.0–36.0)
MCV: 84 fL (ref 80.0–100.0)
Platelets: 175 10*3/uL (ref 150–400)
RBC: 3.94 MIL/uL (ref 3.87–5.11)
RDW: 15.4 % (ref 11.5–15.5)
WBC: 7.2 10*3/uL (ref 4.0–10.5)
nRBC: 0 % (ref 0.0–0.2)

## 2023-10-01 LAB — RPR: RPR Ser Ql: NONREACTIVE

## 2023-10-03 ENCOUNTER — Inpatient Hospital Stay (HOSPITAL_COMMUNITY): Payer: Medicaid Other | Admitting: Anesthesiology

## 2023-10-03 ENCOUNTER — Encounter (HOSPITAL_COMMUNITY)
Admission: AD | Disposition: A | Discharge status: Home / Self Care | Payer: Self-pay | Source: Home / Self Care | Attending: Obstetrics & Gynecology

## 2023-10-03 ENCOUNTER — Inpatient Hospital Stay (HOSPITAL_COMMUNITY)
Admission: AD | Admit: 2023-10-03 | Discharge: 2023-10-05 | DRG: 787 | Disposition: A | Discharge status: Home / Self Care | Payer: Medicaid Other | Attending: Obstetrics & Gynecology | Admitting: Obstetrics & Gynecology

## 2023-10-03 ENCOUNTER — Encounter (HOSPITAL_COMMUNITY): Payer: Self-pay | Admitting: Obstetrics and Gynecology

## 2023-10-03 ENCOUNTER — Other Ambulatory Visit: Payer: Self-pay

## 2023-10-03 DIAGNOSIS — Z148 Genetic carrier of other disease: Secondary | ICD-10-CM | POA: Diagnosis not present

## 2023-10-03 DIAGNOSIS — O34219 Maternal care for unspecified type scar from previous cesarean delivery: Secondary | ICD-10-CM | POA: Diagnosis not present

## 2023-10-03 DIAGNOSIS — Z8249 Family history of ischemic heart disease and other diseases of the circulatory system: Secondary | ICD-10-CM | POA: Diagnosis not present

## 2023-10-03 DIAGNOSIS — O0990 Supervision of high risk pregnancy, unspecified, unspecified trimester: Principal | ICD-10-CM

## 2023-10-03 DIAGNOSIS — O24424 Gestational diabetes mellitus in childbirth, insulin controlled: Secondary | ICD-10-CM | POA: Diagnosis not present

## 2023-10-03 DIAGNOSIS — Z98891 History of uterine scar from previous surgery: Secondary | ICD-10-CM

## 2023-10-03 DIAGNOSIS — O9832 Other infections with a predominantly sexual mode of transmission complicating childbirth: Secondary | ICD-10-CM | POA: Diagnosis not present

## 2023-10-03 DIAGNOSIS — Z3A37 37 weeks gestation of pregnancy: Secondary | ICD-10-CM | POA: Diagnosis not present

## 2023-10-03 DIAGNOSIS — O24425 Gestational diabetes mellitus in childbirth, controlled by oral hypoglycemic drugs: Secondary | ICD-10-CM | POA: Diagnosis present

## 2023-10-03 DIAGNOSIS — A6000 Herpesviral infection of urogenital system, unspecified: Secondary | ICD-10-CM | POA: Diagnosis present

## 2023-10-03 DIAGNOSIS — O26893 Other specified pregnancy related conditions, third trimester: Secondary | ICD-10-CM | POA: Diagnosis not present

## 2023-10-03 DIAGNOSIS — Z3490 Encounter for supervision of normal pregnancy, unspecified, unspecified trimester: Secondary | ICD-10-CM

## 2023-10-03 DIAGNOSIS — O34211 Maternal care for low transverse scar from previous cesarean delivery: Secondary | ICD-10-CM | POA: Diagnosis not present

## 2023-10-03 DIAGNOSIS — F17210 Nicotine dependence, cigarettes, uncomplicated: Secondary | ICD-10-CM | POA: Diagnosis not present

## 2023-10-03 DIAGNOSIS — O24415 Gestational diabetes mellitus in pregnancy, controlled by oral hypoglycemic drugs: Secondary | ICD-10-CM

## 2023-10-03 DIAGNOSIS — O99334 Smoking (tobacco) complicating childbirth: Secondary | ICD-10-CM | POA: Diagnosis not present

## 2023-10-03 DIAGNOSIS — Z833 Family history of diabetes mellitus: Secondary | ICD-10-CM | POA: Diagnosis not present

## 2023-10-03 LAB — CBC
HCT: 38.6 % (ref 36.0–46.0)
Hemoglobin: 12.1 g/dL (ref 12.0–15.0)
MCH: 27.5 pg (ref 26.0–34.0)
MCHC: 31.3 g/dL (ref 30.0–36.0)
MCV: 87.7 fL (ref 80.0–100.0)
Platelets: 173 10*3/uL (ref 150–400)
RBC: 4.4 MIL/uL (ref 3.87–5.11)
RDW: 15.7 % — ABNORMAL HIGH (ref 11.5–15.5)
WBC: 9.3 10*3/uL (ref 4.0–10.5)
nRBC: 0 % (ref 0.0–0.2)

## 2023-10-03 LAB — CREATININE, SERUM
Creatinine, Ser: 0.64 mg/dL (ref 0.44–1.00)
GFR, Estimated: >60 mL/min (ref >60)

## 2023-10-03 LAB — PREPARE RBC (CROSSMATCH)

## 2023-10-03 LAB — GLUCOSE, CAPILLARY
Glucose-Capillary: 83 mg/dL (ref 70–99)
Glucose-Capillary: 74 mg/dL (ref 70–99)

## 2023-10-03 SURGERY — Surgical Case
Anesthesia: Spinal

## 2023-10-03 MED ORDER — ACETAMINOPHEN 10 MG/ML IV SOLN
INTRAVENOUS | Status: DC | PRN
  Administered 2023-10-03: 1000 mg via INTRAVENOUS

## 2023-10-03 MED ORDER — PHENYLEPHRINE HCL (PRESSORS) 10 MG/ML IV SOLN
INTRAVENOUS | Status: DC | PRN
  Administered 2023-10-03 (×2): 160 ug via INTRAVENOUS

## 2023-10-03 MED ORDER — TRANEXAMIC ACID-NACL 1000-0.7 MG/100ML-% IV SOLN
1000.0000 mg | INTRAVENOUS | Status: AC
  Administered 2023-10-03: 1000 mg via INTRAVENOUS

## 2023-10-03 MED ORDER — FENTANYL CITRATE (PF) 100 MCG/2ML IJ SOLN
INTRAMUSCULAR | Status: AC
  Filled 2023-10-03: qty 2

## 2023-10-03 MED ORDER — POVIDONE-IODINE 10 % EX SWAB
2.0000 | Freq: Once | CUTANEOUS | Status: AC
  Administered 2023-10-03: 2 via TOPICAL

## 2023-10-03 MED ORDER — DEXAMETHASONE SODIUM PHOSPHATE 10 MG/ML IJ SOLN
INTRAMUSCULAR | Status: DC | PRN
  Administered 2023-10-03: 10 mg via INTRAVENOUS

## 2023-10-03 MED ORDER — DIPHENHYDRAMINE HCL 25 MG PO CAPS
25.0000 mg | ORAL_CAPSULE | Freq: Four times a day (QID) | ORAL | Status: DC | PRN
  Administered 2023-10-03 (×2): 25 mg via ORAL
  Filled 2023-10-03 (×2): qty 1

## 2023-10-03 MED ORDER — CEFAZOLIN SODIUM-DEXTROSE 2-4 GM/100ML-% IV SOLN
2.0000 g | INTRAVENOUS | Status: AC
  Administered 2023-10-03: 2 g via INTRAVENOUS

## 2023-10-03 MED ORDER — ONDANSETRON HCL 4 MG/2ML IJ SOLN: 4.0000 mg | Freq: Three times a day (TID) | INTRAMUSCULAR | Status: DC | PRN

## 2023-10-03 MED ORDER — OXYCODONE HCL 5 MG PO TABS
5.0000 mg | ORAL_TABLET | ORAL | Status: DC | PRN
Start: 2023-10-03 — End: 2023-10-05
  Administered 2023-10-04: 5 mg via ORAL
  Administered 2023-10-04 – 2023-10-05 (×4): 10 mg via ORAL
  Filled 2023-10-03 (×5): qty 2

## 2023-10-03 MED ORDER — GABAPENTIN 100 MG PO CAPS
200.0000 mg | ORAL_CAPSULE | Freq: Every day | ORAL | Status: DC
  Administered 2023-10-03 – 2023-10-04 (×2): 200 mg via ORAL
  Filled 2023-10-03 (×2): qty 2

## 2023-10-03 MED ORDER — KETOROLAC TROMETHAMINE 30 MG/ML IJ SOLN
30.0000 mg | Freq: Four times a day (QID) | INTRAMUSCULAR | Status: DC | PRN
  Administered 2023-10-03: 30 mg via INTRAVENOUS

## 2023-10-03 MED ORDER — DIPHENHYDRAMINE HCL 50 MG/ML IJ SOLN
12.5000 mg | INTRAMUSCULAR | Status: DC | PRN
  Administered 2023-10-04 (×2): 12.5 mg via INTRAVENOUS
  Filled 2023-10-03 (×3): qty 1

## 2023-10-03 MED ORDER — PRENATAL MULTIVITAMIN CH
1.0000 | ORAL_TABLET | Freq: Every day | ORAL | Status: DC
  Administered 2023-10-04 – 2023-10-05 (×2): 1 via ORAL
  Filled 2023-10-03 (×2): qty 1

## 2023-10-03 MED ORDER — OXYTOCIN-SODIUM CHLORIDE 30-0.9 UT/500ML-% IV SOLN: 2.5000 [IU]/h | INTRAVENOUS | Status: AC

## 2023-10-03 MED ORDER — LACTATED RINGERS IV BOLUS: 1000.0000 mL | Freq: Once | INTRAVENOUS | Status: DC

## 2023-10-03 MED ORDER — SODIUM CHLORIDE 0.9% FLUSH: 3.0000 mL | INTRAVENOUS | Status: DC | PRN

## 2023-10-03 MED ORDER — WITCH HAZEL-GLYCERIN EX PADS: 1.0000 | MEDICATED_PAD | CUTANEOUS | Status: DC | PRN

## 2023-10-03 MED ORDER — SODIUM CHLORIDE 0.9 % IR SOLN
Status: DC | PRN
  Administered 2023-10-03: 1

## 2023-10-03 MED ORDER — MAGNESIUM HYDROXIDE 400 MG/5ML PO SUSP: 30.0000 mL | ORAL | Status: DC | PRN

## 2023-10-03 MED ORDER — BUPIVACAINE IN DEXTROSE 0.75-8.25 % IT SOLN
INTRATHECAL | Status: DC | PRN
  Administered 2023-10-03: 2 mL via INTRATHECAL

## 2023-10-03 MED ORDER — SCOPOLAMINE 1 MG/3DAYS TD PT72
1.0000 | MEDICATED_PATCH | Freq: Once | TRANSDERMAL | Status: DC
  Administered 2023-10-03: 1.5 mg via TRANSDERMAL

## 2023-10-03 MED ORDER — NALOXONE HCL 4 MG/10ML IJ SOLN: 1.0000 ug/kg/h | INTRAVENOUS | Status: DC | PRN

## 2023-10-03 MED ORDER — FENTANYL CITRATE (PF) 100 MCG/2ML IJ SOLN
INTRAMUSCULAR | Status: DC | PRN
  Administered 2023-10-03: 15 ug via INTRATHECAL

## 2023-10-03 MED ORDER — SIMETHICONE 80 MG PO CHEW
80.0000 mg | CHEWABLE_TABLET | Freq: Three times a day (TID) | ORAL | Status: DC
  Administered 2023-10-04 – 2023-10-05 (×4): 80 mg via ORAL
  Filled 2023-10-03 (×5): qty 1

## 2023-10-03 MED ORDER — OXYTOCIN-SODIUM CHLORIDE 30-0.9 UT/500ML-% IV SOLN
INTRAVENOUS | Status: DC | PRN
  Administered 2023-10-03: 30 [IU] via INTRAVENOUS

## 2023-10-03 MED ORDER — SODIUM CHLORIDE 0.9% IV SOLUTION: Freq: Once | INTRAVENOUS | Status: DC

## 2023-10-03 MED ORDER — NALOXONE HCL 0.4 MG/ML IJ SOLN: 0.4000 mg | INTRAMUSCULAR | Status: DC | PRN

## 2023-10-03 MED ORDER — METHYLERGONOVINE MALEATE 0.2 MG/ML IJ SOLN
INTRAMUSCULAR | Status: AC
  Filled 2023-10-03: qty 1

## 2023-10-03 MED ORDER — DEXAMETHASONE SODIUM PHOSPHATE 10 MG/ML IJ SOLN
INTRAMUSCULAR | Status: AC
  Filled 2023-10-03: qty 1

## 2023-10-03 MED ORDER — DIPHENHYDRAMINE HCL 25 MG PO CAPS: 25.0000 mg | ORAL_CAPSULE | ORAL | Status: DC | PRN

## 2023-10-03 MED ORDER — MENTHOL 3 MG MT LOZG: 1.0000 | LOZENGE | OROMUCOSAL | Status: DC | PRN

## 2023-10-03 MED ORDER — ENOXAPARIN SODIUM 40 MG/0.4ML IJ SOSY
40.0000 mg | PREFILLED_SYRINGE | INTRAMUSCULAR | Status: DC
  Administered 2023-10-04 – 2023-10-05 (×2): 40 mg via SUBCUTANEOUS
  Filled 2023-10-03 (×2): qty 0.4

## 2023-10-03 MED ORDER — FENTANYL CITRATE (PF) 100 MCG/2ML IJ SOLN: 25.0000 ug | INTRAMUSCULAR | Status: DC | PRN

## 2023-10-03 MED ORDER — LACTATED RINGERS IV SOLN: INTRAVENOUS | Status: DC

## 2023-10-03 MED ORDER — LACTATED RINGERS IV SOLN: INTRAVENOUS | Status: AC

## 2023-10-03 MED ORDER — ONDANSETRON HCL 4 MG/2ML IJ SOLN
INTRAMUSCULAR | Status: AC
  Filled 2023-10-03: qty 2

## 2023-10-03 MED ORDER — STERILE WATER FOR IRRIGATION IR SOLN
Status: DC | PRN
  Administered 2023-10-03: 1

## 2023-10-03 MED ORDER — ACETAMINOPHEN 500 MG PO TABS
1000.0000 mg | ORAL_TABLET | Freq: Four times a day (QID) | ORAL | Status: DC
  Administered 2023-10-03 – 2023-10-05 (×7): 1000 mg via ORAL
  Filled 2023-10-03 (×7): qty 2

## 2023-10-03 MED ORDER — TRANEXAMIC ACID-NACL 1000-0.7 MG/100ML-% IV SOLN
INTRAVENOUS | Status: AC
  Filled 2023-10-03: qty 100

## 2023-10-03 MED ORDER — KETOROLAC TROMETHAMINE 30 MG/ML IJ SOLN
30.0000 mg | Freq: Four times a day (QID) | INTRAMUSCULAR | Status: AC
  Administered 2023-10-03 – 2023-10-04 (×3): 30 mg via INTRAVENOUS
  Filled 2023-10-03 (×3): qty 1

## 2023-10-03 MED ORDER — METHYLERGONOVINE MALEATE 0.2 MG/ML IJ SOLN
INTRAMUSCULAR | Status: DC | PRN
  Administered 2023-10-03: .2 mg via INTRAMUSCULAR

## 2023-10-03 MED ORDER — METOCLOPRAMIDE HCL 5 MG/ML IJ SOLN
INTRAMUSCULAR | Status: DC | PRN
  Administered 2023-10-03: 10 mg via INTRAVENOUS

## 2023-10-03 MED ORDER — SENNOSIDES-DOCUSATE SODIUM 8.6-50 MG PO TABS
2.0000 | ORAL_TABLET | Freq: Every day | ORAL | Status: DC
  Administered 2023-10-04 – 2023-10-05 (×2): 2 via ORAL
  Filled 2023-10-03 (×2): qty 2

## 2023-10-03 MED ORDER — CEFAZOLIN SODIUM-DEXTROSE 2-4 GM/100ML-% IV SOLN
INTRAVENOUS | Status: AC
  Filled 2023-10-03: qty 100

## 2023-10-03 MED ORDER — COCONUT OIL OIL: 1.0000 | TOPICAL_OIL | Status: DC | PRN

## 2023-10-03 MED ORDER — PHENYLEPHRINE HCL-NACL 20-0.9 MG/250ML-% IV SOLN
INTRAVENOUS | Status: DC | PRN
  Administered 2023-10-03: 60 ug/min via INTRAVENOUS

## 2023-10-03 MED ORDER — ACETAMINOPHEN 10 MG/ML IV SOLN
INTRAVENOUS | Status: AC
  Filled 2023-10-03: qty 100

## 2023-10-03 MED ORDER — SCOPOLAMINE 1 MG/3DAYS TD PT72
MEDICATED_PATCH | TRANSDERMAL | Status: AC
  Filled 2023-10-03: qty 1

## 2023-10-03 MED ORDER — ACETAMINOPHEN 500 MG PO TABS: 1000.0000 mg | ORAL_TABLET | Freq: Four times a day (QID) | ORAL | Status: DC

## 2023-10-03 MED ORDER — ONDANSETRON HCL 4 MG/2ML IJ SOLN
INTRAMUSCULAR | Status: DC | PRN
  Administered 2023-10-03: 4 mg via INTRAVENOUS

## 2023-10-03 MED ORDER — KETOROLAC TROMETHAMINE 30 MG/ML IJ SOLN: 30.0000 mg | Freq: Four times a day (QID) | INTRAMUSCULAR | Status: DC | PRN

## 2023-10-03 MED ORDER — SIMETHICONE 80 MG PO CHEW
80.0000 mg | CHEWABLE_TABLET | ORAL | Status: DC | PRN
  Administered 2023-10-04: 80 mg via ORAL

## 2023-10-03 MED ORDER — LACTATED RINGERS IV SOLN
INTRAVENOUS | Status: DC
Start: 2023-10-03 — End: 2023-10-03

## 2023-10-03 MED ORDER — IBUPROFEN 600 MG PO TABS
600.0000 mg | ORAL_TABLET | Freq: Four times a day (QID) | ORAL | Status: DC
  Administered 2023-10-04 – 2023-10-05 (×4): 600 mg via ORAL
  Filled 2023-10-03 (×4): qty 1

## 2023-10-03 MED ORDER — OXYTOCIN-SODIUM CHLORIDE 30-0.9 UT/500ML-% IV SOLN
INTRAVENOUS | Status: AC
Start: 2023-10-03 — End: ?
  Filled 2023-10-03: qty 500

## 2023-10-03 MED ORDER — DIBUCAINE (PERIANAL) 1 % EX OINT: 1.0000 | TOPICAL_OINTMENT | CUTANEOUS | Status: DC | PRN

## 2023-10-03 MED ORDER — KETOROLAC TROMETHAMINE 30 MG/ML IJ SOLN
INTRAMUSCULAR | Status: AC
  Filled 2023-10-03: qty 1

## 2023-10-03 MED ORDER — ZOLPIDEM TARTRATE 5 MG PO TABS
5.0000 mg | ORAL_TABLET | Freq: Every evening | ORAL | Status: DC | PRN
Start: 2023-10-03 — End: 2023-10-05

## 2023-10-03 MED ORDER — MORPHINE SULFATE (PF) 0.5 MG/ML IJ SOLN
INTRAMUSCULAR | Status: AC
  Filled 2023-10-03: qty 10

## 2023-10-03 MED ORDER — MORPHINE SULFATE (PF) 0.5 MG/ML IJ SOLN
INTRAMUSCULAR | Status: DC | PRN
  Administered 2023-10-03: 150 ug via INTRATHECAL

## 2023-10-03 SURGICAL SUPPLY — 31 items
BENZOIN TINCTURE PRP APPL 2/3 (GAUZE/BANDAGES/DRESSINGS) IMPLANT
CHLORAPREP W/TINT 26 (MISCELLANEOUS) ×2 IMPLANT
CLAMP UMBILICAL CORD (MISCELLANEOUS) ×1 IMPLANT
CLOTH BEACON ORANGE TIMEOUT ST (SAFETY) ×1 IMPLANT
DRSG OPSITE POSTOP 4X10 (GAUZE/BANDAGES/DRESSINGS) ×1 IMPLANT
ELECT REM PT RETURN 9FT ADLT (ELECTROSURGICAL) ×1
ELECTRODE REM PT RTRN 9FT ADLT (ELECTROSURGICAL) ×1 IMPLANT
EXTRACTOR VACUUM BELL STYLE (SUCTIONS) IMPLANT
GAUZE PAD ABD 7.5X8 STRL (GAUZE/BANDAGES/DRESSINGS) IMPLANT
GAUZE SPONGE 4X4 12PLY STRL LF (GAUZE/BANDAGES/DRESSINGS) IMPLANT
GLOVE BIOGEL PI IND STRL 6.5 (GLOVE) ×2 IMPLANT
GLOVE ECLIPSE 6.5 STRL STRAW (GLOVE) ×2 IMPLANT
GOWN STRL REUS W/TWL LRG LVL3 (GOWN DISPOSABLE) ×3 IMPLANT
KIT ABG SYR 3ML LUER SLIP (SYRINGE) IMPLANT
MAT PREVALON FULL STRYKER (MISCELLANEOUS) IMPLANT
NDL HYPO 25X1 1.5 SAFETY (NEEDLE) IMPLANT
NEEDLE HYPO 22GX1.5 SAFETY (NEEDLE) ×1 IMPLANT
NEEDLE HYPO 25X1 1.5 SAFETY (NEEDLE)
NS IRRIG 1000ML POUR BTL (IV SOLUTION) ×1 IMPLANT
PACK C SECTION WH (CUSTOM PROCEDURE TRAY) ×1 IMPLANT
PAD OB MATERNITY 4.3X12.25 (PERSONAL CARE ITEMS) ×1 IMPLANT
RETRACTOR TRAXI PANNICULUS (MISCELLANEOUS) IMPLANT
STRIP CLOSURE SKIN 1/2X4 (GAUZE/BANDAGES/DRESSINGS) IMPLANT
SUT MON AB 4-0 PS1 27 (SUTURE) ×1 IMPLANT
SUT PLAIN ABS 2-0 CT1 27XMFL (SUTURE) ×1 IMPLANT
SUT VIC AB 0 CT1 36 (SUTURE) ×2 IMPLANT
SUT VIC AB 0 CTX36XBRD ANBCTRL (SUTURE) ×1 IMPLANT
SYR CONTROL 10ML LL (SYRINGE) ×1 IMPLANT
TOWEL OR 17X24 6PK STRL BLUE (TOWEL DISPOSABLE) ×1 IMPLANT
TRAY FOLEY W/BAG SLVR 14FR LF (SET/KITS/TRAYS/PACK) ×1 IMPLANT
WATER STERILE IRR 1000ML POUR (IV SOLUTION) ×1 IMPLANT

## 2023-10-03 NOTE — Brief Op Note (Signed)
10/03/2023  4:39 PM  PATIENT:  [name]  38 y.o. female  PRE-OPERATIVE DIAGNOSIS:  Repeat Cesarean Section  POST-OPERATIVE DIAGNOSIS:  Repeat Cesarean Section  PROCEDURE:  Procedure(s): CESAREAN SECTION (N/A) APPLICATION OF CELL SAVER (N/A)  SURGEON:  Surgeons and Role:    * Lesly Dukes, MD - Primary    * Anyanwu, Jethro Bastos, MD - Assisting    * Warden Fillers, MD  ASSISTANTS: Mittie Bodo, MD  ANESTHESIA:   spinal  EBL:  887   BLOOD ADMINISTERED:none  DRAINS: none   LOCAL MEDICATIONS USED:  NONE  SPECIMEN:  Source of Specimen:  Placenta to L & D  DISPOSITION OF SPECIMEN:   L & D  COUNTS:  YES  TOURNIQUET:  * No tourniquets in log *  DICTATION: .Dragon Dictation  PLAN OF CARE: Admit to inpatient   PATIENT DISPOSITION:  PACU - hemodynamically stable.   Delay start of Pharmacological VTE agent (>24hrs) due to surgical blood loss or risk of bleeding: no

## 2023-10-03 NOTE — Anesthesia Preprocedure Evaluation (Signed)
Anesthesia Evaluation  Patient identified by MRN, date of birth, ID band Patient awake    Reviewed: Allergy & Precautions, NPO status , Patient's Chart, lab work & pertinent test results  Airway Mallampati: II  TM Distance: >3 FB Neck ROM: Full    Dental no notable dental hx. (+) Teeth Intact, Dental Advisory Given   Pulmonary Current Smoker and Patient abstained from smoking.   Pulmonary exam normal breath sounds clear to auscultation       Cardiovascular hypertension, Normal cardiovascular exam Rhythm:Regular Rate:Normal     Neuro/Psych negative neurological ROS  negative psych ROS   GI/Hepatic negative GI ROS, Neg liver ROS,,,  Endo/Other  diabetes, Poorly Controlled, Gestational, Oral Hypoglycemic Agents    Renal/GU negative Renal ROS  negative genitourinary   Musculoskeletal negative musculoskeletal ROS (+)    Abdominal   Peds  Hematology  (+) Blood dyscrasia, anemia   Anesthesia Other Findings history of 2 prior CS with anterior placenta and myometrial thinning noted on [place]. No lacunae were seen, but accreta possible.  Reproductive/Obstetrics (+) Pregnancy                             Anesthesia Physical Anesthesia Plan  ASA: 3  Anesthesia Plan: Spinal   Post-op Pain Management:    Induction:   PONV Risk Score and Plan: 2 and Treatment may vary due to age or medical condition  Airway Management Planned: Natural Airway  Additional Equipment:   Intra-op Plan:   Post-operative Plan:   Informed Consent: I have reviewed the patients History and Physical, chart, labs and discussed the procedure including the risks, benefits and alternatives for the proposed anesthesia with the patient or authorized representative who has indicated his/her understanding and acceptance.     Dental advisory given  Plan Discussed with: CRNA  Anesthesia Plan Comments: (2 IVs, T&Cx2)        Anesthesia Quick Evaluation

## 2023-10-03 NOTE — Anesthesia Procedure Notes (Signed)
Spinal  Patient location during procedure: OR Start time: 10/03/2023 3:36 PM End time: 10/03/2023 3:39 PM Reason for block: surgical anesthesia Staffing Performed: anesthesiologist  Anesthesiologist: Elmer Picker, MD Performed by: Elmer Picker, MD Authorized by: Elmer Picker, MD   Preanesthetic Checklist Completed: patient identified, IV checked, risks and benefits discussed, surgical consent, monitors and equipment checked, pre-op evaluation and timeout performed Spinal Block Patient position: sitting Prep: DuraPrep and site prepped and draped Patient monitoring: cardiac monitor, continuous pulse ox and blood pressure Approach: midline Location: L3-4 Injection technique: single-shot Needle Needle type: Pencan  Needle gauge: 24 G Needle length: 9 cm Assessment Sensory level: T6 Events: CSF return Additional Notes Functioning IV was confirmed and monitors were applied. Sterile prep and drape, including hand hygiene and sterile gloves were used. The patient was positioned and the spine was prepped. The skin was anesthetized with lidocaine.  Free flow of clear CSF was obtained prior to injecting local anesthetic into the CSF.  The spinal needle aspirated freely following injection.  The needle was carefully withdrawn.  The patient tolerated the procedure well.

## 2023-10-03 NOTE — Discharge Summary (Shared)
Postpartum Discharge Summary  Date of Service updated***     Patient Name: Melanie Strong DOB: 11/17/1985 MRN: [id number]  Date of admission: 10/03/2023 Delivery date:10/03/2023 Delivering provider: Lesly Dukes Date of discharge: 10/04/2023  Admitting diagnosis: Pregnancy [Z34.90] S/P cesarean section [Z98.891] Intrauterine pregnancy: [redacted]w[redacted]d     Secondary diagnosis:  Principal Problem:   Pregnancy Active Problems:   S/P cesarean section  Additional problems: ***    Discharge diagnosis: Term Pregnancy Delivered and repeat C/S for concern for focal accreta given thin myometrium                                               Post partum procedures:{Postpartum procedures:23558} Augmentation: N/A Complications: Hemorrhage>1065mL  Hospital course: Sceduled C/S   38 y.o. yo [id number] at [redacted]w[redacted]d was admitted to the hospital 10/03/2023 for scheduled cesarean section with the following indication: repeat C/S for concern for focal accreta given thin though no accreta seen intraoperatively .Delivery details are as follows:  Membrane Rupture Time/Date: 4:08 PM,10/03/2023  Delivery Method:C-Section, Low Transverse Operative Delivery:N/A Details of operation can be found in separate operative note.  Patient had a postpartum course complicated by***.  She is ambulating, tolerating a regular diet, passing flatus, and urinating well. Patient is discharged home in stable condition on  10/04/23        Newborn Data: Birth date:10/03/2023 Birth time:4:08 PM Gender:Female Living status:Living Apgars:8 ,8  Weight:3140 g    Magnesium Sulfate received: {Mag received:30440022} BMZ received: No Rhophylac:N/A MMR:N/A T-DaP: declined Flu: declined RSV Vaccine received: declined  Transfusion:{Transfusion received:30440034}  Immunizations received: There is no immunization history for the selected administration types on file for this patient.  Physical exam  Vitals:   10/04/23 0128 10/04/23  0533 10/04/23 0930 10/04/23 2030  BP: 108/66 (!) 98/56 (!) 110/54 115/71  Pulse: 70 64 77 69  Resp: 18 18 18 18   Temp: 98.8 F (37.1 C) 98.8 F (37.1 C) 98 F (36.7 C) (!) 97.4 F (36.3 C)  TempSrc: Oral Oral  Oral  SpO2: 98% 98%  100%  Weight:      Height:       General: {Exam; general:21111117} Lochia: {Desc; appropriate/inappropriate:30686::"appropriate"} Uterine Fundus: {Desc; firm/soft:30687} Incision: {Exam; incision:21111123} DVT Evaluation: {Exam; dvt:2111122} Labs: Lab Results  Component Value Date   WBC 13.5 (H) 10/04/2023   HGB 10.1 (L) 10/04/2023   HCT 31.0 (L) 10/04/2023   MCV 83.8 10/04/2023   PLT 187 10/04/2023      Latest Ref Rng & Units 10/03/2023    6:46 PM  CMP  Creatinine 0.44 - 1.00 mg/dL 9.32    Edinburgh Score:    10/04/2023   12:10 PM  Edinburgh Postnatal Depression Scale Screening Tool  I have been able to laugh and see the funny side of things. 0  I have looked forward with enjoyment to things. 0  I have blamed myself unnecessarily when things went wrong. 0  I have been anxious or worried for no good reason. 0  I have felt scared or panicky for no good reason. 0  Things have been getting on top of me. 0  I have been so unhappy that I have had difficulty sleeping. 0  I have felt sad or miserable. 0  I have been so unhappy that I have been crying. 0  The thought of harming myself has  occurred to me. 0  Edinburgh Postnatal Depression Scale Total 0   Edinburgh Postnatal Depression Scale Total: 0   After visit meds:  Allergies as of 10/04/2023   No Known Allergies   Med Rec must be completed prior to using this [facility]***        Discharge home in stable condition Infant Feeding: {Baby feeding:23562} Infant Disposition:{CHL IP OB HOME WITH WUJWJX:91478} Discharge instruction: per After Visit Summary and Postpartum booklet. Activity: Advance as tolerated. Pelvic rest for 6 weeks.  Diet: {OB GNFA:21308657} Future  Appointments: Future Appointments  Date Time Provider Department Center  10/09/2023 10:20 AM [facility] NURSE [facility] None  11/17/2023  8:30 AM [facility] LAB [facility] None  11/17/2023  9:55 AM Leftwich-Kirby, Wilmer Floor, CNM [facility] None  11/26/2023 10:30 AM Sater, Pearletha Furl, MD [facility] None   Follow up Visit: Message to [facility] 2/1  Please schedule this patient for a In person postpartum visit in 4 weeks with the following provider: Any provider. Additional Postpartum F/U:2 hour GTT and Incision check 1 week  High risk pregnancy complicated by: GDM and Hx of PreE, Hx ELISA positive HSV, Multigravida of AMA Delivery mode:  C-Section, Low Transverse Anticipated Birth Control:  Unsure   10/04/2023 Richardson Landry, CNM

## 2023-10-03 NOTE — Op Note (Signed)
[name] PROCEDURE DATE: 10/03/2023  PREOPERATIVE DIAGNOSIS: Intrauterine pregnancy at  [redacted]w[redacted]d weeks gestation for repeat cesarean section and thin myometrium with suspicions for focal accreta  POSTOPERATIVE DIAGNOSIS:  Intrauterine pregnancy at  [redacted]w[redacted]d weeks gestation for repeat cesarean section and thin myometrium and no accreta  PROCEDURE:    Low Transverse Cesarean Section  SURGEON:  Dr. Elsie Lincoln  ASSISTANT: Dr. Mariel Aloe, Dr. Jaynie Collins, Dr. Mittie Bodo An experienced assistant was required given the standard of surgical care given the complexity of the case. This assistant was needed for exposure, dissection, suctioning, retraction, instrument exchange, assisting with delivery with administration of fundal pressure, and for overall help during the procedure.   INDICATIONS: Melanie Strong is a 38 y.o. [id number] at [redacted]w[redacted]d for repeat cesarean section.  The risks of cesarean section discussed with the patient included but were not limited to: bleeding which may require transfusion or reoperation; infection which may require antibiotics; injury to bowel, bladder, ureters or other surrounding organs; injury to the fetus; need for additional procedures including hysterectomy in the event of a life-threatening hemorrhage; placental abnormalities wth subsequent pregnancies, incisional problems, thromboembolic phenomenon and other postoperative/anesthesia complications. The patient concurred with the proposed plan, giving informed written consent for the procedure.    FINDINGS:  Viable female  infant in cephalic presentation, 8,8 Apgars, weight to be determined in 1 hour, clear amniotic fluid.  Intact placenta, three vessel cord.  Grossly normal uterus, ovaries and fallopian tubes. .   ANESTHESIA:    Spinal  ESTIMATED BLOOD LOSS: 1014 cc  SPECIMENS: Placenta sent to L & D  COMPLICATIONS: None immediate  PROCEDURE IN DETAIL:  The patient received intravenous antibiotics and had  sequential compression devices applied to her lower extremities.  Spinal anesthesia was administered  and was found to be adequate. She was then placed in a dorsal supine position with a leftward tilt, and prepped and draped in a sterile manner.  A foley catheter was placed into her bladder and attached to constant gravity.  After an adequate timeout was performed, a Pfannenstiel skin incision was made with scalpel and carried through to the underlying layer of fascia. The fascia was incised in the midline and this incision was extended bilaterally using the Mayo scissors. Kocher clamps were applied to the superior aspect of the fascial incision and the underlying rectus muscles were dissected off bluntly and with the Bovie. The rectus muscles were separated in the midline bluntly and the peritoneum was entered bluntly.   An Alexis retractor was placed in the peritoneal cavity.  transverse hysterotomy was made with a scalpel and extended bilaterally bluntly. The infant was successfully delivered, and cord was clamped and cut and infant was handed over to awaiting neonatology team. Uterine massage was then administered and the placenta delivered intact with three-vessel cord. The uterus was cleared of clot and debris.  The hysterotomy was closed with 0 vicryl.  The peritoneum and rectus muscles were noted to be hemostatic.  The peritoneum was closed with 0 Vicryl.  The fascia was closed with 0-Vicryl in a running fashion with good restoration of anatomy.  The subcutaneus tissue was copiously irrigated and re approximated with 2-0 plain gut .  The skin was closed with 4-0 Vicryl in a subcuticular fashion.    Pt tolerated the procedure will.  All counts were correct x2.  Pt went to the recovery room in stable condition.

## 2023-10-03 NOTE — H&P (Signed)
Obstetric Preoperative History and Physical  Melanie Strong is a 38 y.o. [id number] with IUP at [redacted]w[redacted]d presenting for scheduled cesarean section.  Reports good fetal movement, no bleeding, no contractions, no leaking of fluid.  No acute preoperative concerns.    Cesarean Section Indication:  Hx of 2 prior C/S, A1GDM  Prenatal Course Source of Care: [facility]  with onset of care at 13 weeks Pregnancy complications or risks: Patient Active Problem List   Diagnosis Date Noted   Pregnancy 10/03/2023   Gestational diabetes mellitus (GDM) in third trimester controlled on oral hypoglycemic drug 07/16/2023   Supervision of high risk pregnancy, antepartum 03/17/2023   History of cesarean section 06/19/2022   History of ELISA positive for HSV 06/19/2022   Multigravida of advanced maternal age in third trimester 04/24/2022   Hx of preeclampsia, prior pregnancy, currently pregnant 03/20/2022   Alpha thalassemia silent carrier 02/13/2021   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   Prenatal labs and studies: ABO, Rh: --/--/O POS (01/29 1032) Antibody: NEG (01/29 1032) Rubella: 1.10 (07/22 0955) RPR: NON REACTIVE (01/29 1100)  HBsAg: Negative (07/22 0955)  HIV: Non Reactive (11/12 0817)  AVW:UJWJXBJY/-- (01/20 1350) 1 hr Glucola  abnormal Genetic screening normal Anatomy US normal  Prenatal Transfer Tool  Maternal Diabetes: Yes:  Diabetes Type:  Insulin/Medication controlled Genetic Screening: Normal Maternal Ultrasounds/Referrals: Other: Fetal Ultrasounds or other Referrals:  Other:  Maternal Substance Abuse:  No (tobacco) Significant Maternal Medications:  Meds include: Other: metformin, valtrex Significant Maternal Lab Results: Other:   Past Medical History:  Diagnosis Date   Anemia    Diabetes mellitus without complication (HCC)    Genital herpes 07/11/2021    Gestational diabetes    History of ileus    Pregnancy induced hypertension     Past Surgical History:  Procedure Laterality Date   CESAREAN SECTION  08/23/2021   Procedure: CESAREAN SECTION;  Surgeon: [name], MD;  Location: [facility] LD ORS;  Service: Obstetrics;;   CESAREAN SECTION N/A 10/04/2022   Procedure: CESAREAN SECTION;  Surgeon: Adam Phenix, MD;  Location: [facility] LD ORS;  Service: Obstetrics;  Laterality: N/A;    OB History  Gravida Para Term Preterm AB Living  4 2 2  1 2   SAB IAB Ectopic Multiple Live Births  1 0 0 0 2    # Outcome Date GA Lbr Len/2nd Weight Sex Type Anes PTL Lv  4 Current           3 Term 10/04/22 [redacted]w[redacted]d  3770 g M CS-LTranv Spinal  LIV  2 Term 08/23/21 [redacted]w[redacted]d  3940 g F CS-LTranv EPI  LIV  1 SAB 06/29/20 [redacted]w[redacted]d    SAB       Social History   Socioeconomic History   Marital status: Married    Spouse name: Not on file   Number of children: Not on file   Years of education: Not on file   Highest education level: Not on file  Occupational History   Not on file  Tobacco Use   Smoking status: Some Days    Current packs/day: 0.25    Average packs/day: 0.3 packs/day for 10.0 years (2.5 ttl pk-yrs)    Types: Cigarettes    Passive exposure: Never   Smokeless tobacco: Never  Vaping Use   Vaping status: Every Day  Substance and Sexual Activity   Alcohol  use: No    Comment: rare   Drug use: No   Sexual activity: Yes    Partners: Male    Birth control/protection: None  Other Topics Concern   Not on file  Social History Narrative   Not on file   Social Drivers of Health   Financial Resource Strain: Not on file  Food Insecurity: No Food Insecurity (10/03/2023)   Hunger Vital Sign    Worried About Running Out of Food in the Last Year: Never true    Ran Out of Food in the Last Year: Never true  Transportation Needs: No Transportation Needs (10/03/2023)   PRAPARE - Administrator, Civil Service (Medical): No    Lack of  Transportation (Non-Medical): No  Physical Activity: Not on file  Stress: Not on file  Social Connections: Unknown (10/03/2023)   Social Connection and Isolation Panel [NHANES]    Frequency of Communication with Friends and Family: More than three times a week    Frequency of Social Gatherings with Friends and Family: Not on file    Attends Religious Services: Not on Marketing executive or Organizations: Not on file    Attends Banker Meetings: Not on file    Marital Status: Not on file    Family History  Problem Relation Age of Onset   Hypertension Mother    Diabetes Mother        Boarderline   Lupus Mother        2nd opinion says no/ unsure   Cancer Father        rectal   Sickle cell anemia Sister    Healthy Brother    Healthy Brother    Cancer Paternal Aunt        brain   Cancer Paternal Uncle        stomach   Diabetes Maternal Grandmother    Other Neg Hx    Asthma Neg Hx    Heart disease Neg Hx    Stroke Neg Hx     Medications Prior to Admission  Medication Sig Dispense Refill Last Dose/Taking   aspirin EC 81 MG tablet Take 1 tablet (81 mg total) by mouth daily. Take after 12 weeks for prevention of preeclampsia later in pregnancy 300 tablet 2 10/02/2023   gabapentin (NEURONTIN) 300 MG capsule Take 1 capsule (300 mg total) by mouth 2 (two) times daily as needed. 60 capsule 2 Taking As Needed   lidocaine (XYLOCAINE) 5 % ointment Apply 1 Application topically as needed. 35.44 g 1 Taking As Needed   metFORMIN (GLUCOPHAGE) 500 MG tablet Take 2 tablets (1,000 mg total) by mouth 2 (two) times daily with a meal. 120 tablet 3 10/02/2023   ondansetron (ZOFRAN) 4 MG tablet Take 2 tablets (8 mg total) by mouth 2 (two) times daily. (Patient not taking: Reported on 09/29/2023) 20 tablet 0 Taking Differently   Prenatal Vit-Fe Fumarate-FA (MULTIVITAMIN-PRENATAL) 27-0.8 MG TABS tablet Take 1 tablet by mouth daily before breakfast. 30 tablet 12 10/02/2023   Accu-Chek  Softclix Lancets lancets 100 each by Other route 4 (four) times daily. 100 each 12    Blood Glucose Monitoring Suppl (ACCU-CHEK GUIDE) w/Device KIT 1 kit by Does not apply route daily. 1 kit 0    Blood Pressure Monitoring (BLOOD PRESSURE KIT) DEVI 1 kit by Does not apply route once a week. Check Blood Pressure regularly and record readings into the Bab[employer] App.  Large Cuff.  DX O90.0  1 each 0    glucose blood (ACCU-CHEK GUIDE TEST) test strip Use four times daily to check blood sugar fasting and 2 hours post meal 200 each 12     No Known Allergies  Review of Systems: Pertinent items noted in HPI and remainder of comprehensive ROS otherwise negative.  Physical Exam: BP 127/83   Pulse 68   Temp 98.3 F (36.8 C) (Oral)   Resp 16   Ht 5\' 5"  (1.651 m)   Wt 86.6 kg   LMP 01/11/2023   SpO2 98%   BMI 31.78 kg/m  FHR by Doppler: 140 bpm CONSTITUTIONAL: Well-developed, well-nourished female in no acute distress.  HENT:  Normocephalic, atraumatic, External right and left ear normal. Oropharynx is clear and moist EYES: Conjunctivae and EOM are normal. Pupils are equal, round, and reactive to light. No scleral icterus.  NECK: Normal range of motion, supple, no masses SKIN: Skin is warm and dry. No rash noted. Not diaphoretic. No erythema. No pallor. NEUROLGIC: Alert and oriented to person, place, and time. Normal reflexes, muscle tone coordination. No cranial nerve deficit noted. PSYCHIATRIC: Normal mood and affect. Normal behavior. Normal judgment and thought content. CARDIOVASCULAR: Normal heart rate noted, regular rhythm RESPIRATORY: Effort and breath sounds normal, no problems with respiration noted ABDOMEN: Soft, nontender, nondistended, gravid.  PELVIC: Deferred MUSCULOSKELETAL: Normal range of motion. No edema and no tenderness. 2+ distal pulses.  Pertinent Labs/Studies:   Results for orders placed or performed during the hospital encounter of 10/03/23 (from the past 72 hours)   Type and screen     Status: None (Preliminary result)   Collection Time: 10/01/23 10:32 AM  Result Value Ref Range   ABO/RH(D) O POS    Antibody Screen NEG    Sample Expiration      10/04/2023,2359 Performed at [facility], [address] [place], [place] [address]    Unit Number [id number]    Blood Component Type RED CELLS,LR    Unit division 00    Status of Unit ALLOCATED    Transfusion Status OK TO TRANSFUSE    Crossmatch Result Compatible    Unit Number [id number]    Blood Component Type RBC LR PHER2    Unit division 00    Status of Unit ALLOCATED    Transfusion Status OK TO TRANSFUSE    Crossmatch Result Compatible   CBC     Status: Abnormal   Collection Time: 10/01/23 11:00 AM  Result Value Ref Range   WBC 7.2 4.0 - 10.5 K/uL   RBC 3.94 3.87 - 5.11 MIL/uL   Hemoglobin 10.7 (L) 12.0 - 15.0 g/dL   HCT 72.5 (L) 36.6 - 44.0 %   MCV 84.0 80.0 - 100.0 fL   MCH 27.2 26.0 - 34.0 pg   MCHC 32.3 30.0 - 36.0 g/dL   RDW 34.7 42.5 - 95.6 %   Platelets 175 150 - 400 K/uL   nRBC 0.0 0.0 - 0.2 %    Comment: Performed at [facility], [address] [place], [place] [address]  RPR     Status: None   Collection Time: 10/01/23 11:00 AM  Result Value Ref Range   RPR Ser Ql NON REACTIVE NON REACTIVE    Comment: Performed at [facility], [address], [place], [place] [address]  Glucose, capillary     Status: None   Collection Time: 10/03/23 12:56 PM  Result Value Ref Range   Glucose-Capillary 83 70 - 99 mg/dL  Comment: Glucose reference range applies only to samples taken after fasting for at least 8 hours.  Prepare RBC (crossmatch)     Status: None   Collection Time: 10/03/23  1:00 PM  Result Value Ref Range   Order Confirmation      ORDER PROCESSED BY BLOOD BANK Performed at [facility], [address] [place], [place] [address]     Assessment and Plan: CYERA BALBONI is a 38 y.o. [phone] at [redacted]w[redacted]d being admitted for scheduled  cesarean section--Repeat with anterior placenta with myometrial thinning with small concern for focal accreta.  She was consented for blood transfusion, cell saver and hysterectomy if life threatening bleeding occurs.  Marland Kitchen.The risks of surgery were discussed with the patient including but were not limited to: bleeding which may require transfusion or reoperation; infection which may require antibiotics; injury to bowel, bladder, ureters or other surrounding organs; injury to the fetus; need for additional procedures including hysterectomy in the event of a life-threatening hemorrhage; formation of adhesions; placental abnormalities wth subsequent pregnancies; incisional problems; thromboembolic phenomenon and other postoperative/anesthesia complications. The patient concurred with the proposed plan, giving informed written consent for the procedure. Patient has been NPO since last night she will remain NPO for procedure. Anesthesia and OR aware. Preoperative prophylactic antibiotics and SCDs ordered on call to the OR. To OR when ready.   Cell saver is not available at this time.  Unclear if it will be available today.  Will proceed with case.  4 units blood available.   Elsie Lincoln, MD Obstetrician & Gynecologist, [facility] for [employer], [facility]

## 2023-10-03 NOTE — Transfer of Care (Signed)
Immediate Anesthesia Transfer of Care Note  Patient: Melanie Strong  Procedure(s) Performed: CESAREAN SECTION APPLICATION OF CELL SAVER  Patient Location: PACU  Anesthesia Type:Spinal  Level of Consciousness: awake, alert , and oriented  Airway & Oxygen Therapy: Patient Spontanous Breathing  Post-op Assessment: Report given to RN and Post -op Vital signs reviewed and stable  Post vital signs: Reviewed and stable  Last Vitals:  Vitals Value Taken Time  BP 112/61 10/03/23 1709  Temp    Pulse 144 10/03/23 1711  Resp 16 10/03/23 1711  SpO2 83 % 10/03/23 1711  Vitals shown include unfiled device data.  Last Pain:  Vitals:   10/03/23 1242  TempSrc: Oral  PainSc: 0-No pain         Complications: No notable events documented.

## 2023-10-04 ENCOUNTER — Other Ambulatory Visit: Payer: Self-pay

## 2023-10-04 DIAGNOSIS — Z419 Encounter for procedure for purposes other than remedying health state, unspecified: Secondary | ICD-10-CM | POA: Diagnosis not present

## 2023-10-04 LAB — CBC
HCT: 31 % — ABNORMAL LOW (ref 36.0–46.0)
Hemoglobin: 10.1 g/dL — ABNORMAL LOW (ref 12.0–15.0)
MCH: 27.3 pg (ref 26.0–34.0)
MCHC: 32.6 g/dL (ref 30.0–36.0)
MCV: 83.8 fL (ref 80.0–100.0)
Platelets: 187 10*3/uL (ref 150–400)
RBC: 3.7 MIL/uL — ABNORMAL LOW (ref 3.87–5.11)
RDW: 15.4 % (ref 11.5–15.5)
WBC: 13.5 10*3/uL — ABNORMAL HIGH (ref 4.0–10.5)
nRBC: 0 % (ref 0.0–0.2)

## 2023-10-04 NOTE — Plan of Care (Signed)

## 2023-10-04 NOTE — Lactation Note (Signed)
This note was copied from a baby's chart. Lactation Consultation Note  Patient Name: Melanie Strong [facility]W'C Date: 10/04/2023 Age:37 hours Reason for consult: Initial assessment;Early term 37-38.6wks, Per MOB, infant is latch well and she has no  breastfeeding concerns at this time. Infant recently BF for 20 minutes at 2315 pm. LC did not observe latch at this time. MOB is experienced with breastfeeding see maternal data below. Infant had one void and one stool since birth. LC did observe infant has upper labial frenulum tight to upper lip and gum line. MOB will continue to breastfeed infant by cues, on demand, every 2-3 hours, skin to skin. LC discussed importance of maternal rest, balance meals and snacks, hydration. MOB knows to call for latch assistance if needed. MOB was made aware of O/P services, breastfeeding support groups, community resources, and our phone # for post-discharge questions.    Maternal Data Has patient been taught Hand Expression?: Yes Does the patient have breastfeeding experience prior to this delivery?: Yes How long did the patient breastfeed?: Per MOB, she BF 2nd child for 6 months who is currently 1 year.  Feeding Mother's Current Feeding Choice: Breast Milk  LATCH Score                    Lactation Tools Discussed/Used    Interventions Interventions: Breast feeding basics reviewed;Skin to skin;Hand express;Education;LC Services brochure  Discharge Pump: DEBP;Personal  Consult Status Consult Status: Follow-up Date: 10/04/23 Follow-up type: In-patient    Frederico Hamman 10/04/2023, 1:21 AM

## 2023-10-04 NOTE — Progress Notes (Signed)
Patient ID: Melanie Strong, female   DOB: [birth year], 38 y.o.   MRN: [id number] POSTPARTUM PROGRESS NOTE  Post Partum Day 1  Subjective:  Melanie Strong is a 38 y.o. [id number] s/p rLTCS at [redacted]w[redacted]d.  No acute events overnight.  Pt denies problems with ambulating, voiding or po intake.  She denies nausea or vomiting.  Pain is moderately controlled.  She reports feeling abdominal cramping and tightness in her abdomen. She has not had flatus. She has not had bowel movement.  Lochia Small.   Objective: Blood pressure 114/72, pulse (!) 57, temperature 98.3 F (36.8 C), temperature source Oral, resp. rate 19, height 5\' 6"  (1.676 m), weight 99.3 kg, last menstrual period 07/05/2023, SpO2 100%.  Physical Exam:  General: alert, cooperative and no distress Chest: no respiratory distress Heart:regular rate, distal pulses intact Abdomen: soft, nontender,  Uterine Fundus: firm, tender DVT Evaluation: No calf swelling or tenderness Extremities: no edema Skin: warm, dry; incision clean/dry/intact     Latest Ref Rng & Units 10/04/2023    5:08 AM 10/03/2023    7:52 PM 10/01/2023   11:00 AM  CBC  WBC 4.0 - 10.5 K/uL 13.5  9.3  7.2   Hemoglobin 12.0 - 15.0 g/dL 95.6  21.3  08.6   Hematocrit 36.0 - 46.0 % 31.0  38.6  33.1   Platelets 150 - 400 K/uL 187  173  175    Assessment/Plan: Melanie Strong is a 38 y.o. [id number] s/p rLTCS at [redacted]w[redacted]d  PPD#1 - Doing well Contraception: undecided Feeding: breast Dispo: Plan for discharge tomorrow.   LOS: 1 days   Margie Billet, MD 10/04/2023 7:59 AM

## 2023-10-04 NOTE — Progress Notes (Signed)
Subjective: Postpartum Day 1: Cesarean Delivery Patient reports tolerating PO and no problems voiding.    Objective: Vital signs in last 24 hours: Temp:  [96.5 F (35.8 C)-99.2 F (37.3 C)] 98 F (36.7 C) (02/01 0930) Pulse Rate:  [57-153] 77 (02/01 0930) Resp:  [16-22] 18 (02/01 0930) BP: (98-121)/(51-86) 110/54 (02/01 0930) SpO2:  [83 %-99 %] 98 % (02/01 0533)  Physical Exam:  General: alert, cooperative, and no distress Lochia: appropriate Uterine Fundus: firm Incision: no significant drainage DVT Evaluation: No evidence of DVT seen on physical exam.  Recent Labs    10/03/23 1952 10/04/23 0508  HGB 12.1 10.1*  HCT 38.6 31.0*    Assessment/Plan: Status post Cesarean section. Doing well postoperatively.  Continue current care.  Sharen Counter, CNM 10/04/2023, 2:34 PM

## 2023-10-04 NOTE — Anesthesia Postprocedure Evaluation (Signed)
Anesthesia Post Note  Patient: Melanie Strong  Procedure(s) Performed: CESAREAN SECTION     Patient location during evaluation: PACU Anesthesia Type: Spinal Level of consciousness: oriented and awake and alert Pain management: pain level controlled Vital Signs Assessment: post-procedure vital signs reviewed and stable Respiratory status: spontaneous breathing, respiratory function stable and patient connected to nasal cannula oxygen Cardiovascular status: blood pressure returned to baseline and stable Postop Assessment: no headache, no backache and no apparent nausea or vomiting Anesthetic complications: no  No notable events documented.  Last Vitals:  Vitals:   10/04/23 0128 10/04/23 0533  BP: 108/66 (!) 98/56  Pulse: 70 64  Resp: 18 18  Temp: 37.1 C 37.1 C  SpO2: 98% 98%    Last Pain:  Vitals:   10/04/23 0533  TempSrc: Oral  PainSc: 8    Pain Goal:                   Devanny Palecek L Jevan Gaunt

## 2023-10-05 ENCOUNTER — Encounter: Payer: Self-pay | Admitting: Advanced Practice Midwife

## 2023-10-05 ENCOUNTER — Other Ambulatory Visit: Payer: Self-pay | Admitting: Family Medicine

## 2023-10-05 DIAGNOSIS — Z98891 History of uterine scar from previous surgery: Secondary | ICD-10-CM

## 2023-10-05 LAB — TYPE AND SCREEN
ABO/RH(D): O POS
Antibody Screen: NEGATIVE
Unit division: 0
Unit division: 0
Unit division: 0
Unit division: 0

## 2023-10-05 LAB — BPAM RBC
Blood Product Expiration Date: 202503032359
Blood Product Expiration Date: 202503032359
Blood Product Expiration Date: 202503082359
Blood Product Expiration Date: 202503082359
ISSUE DATE / TIME: 202501311732
ISSUE DATE / TIME: 202501311732
Unit Type and Rh: 5100
Unit Type and Rh: 5100
Unit Type and Rh: 5100
Unit Type and Rh: 5100

## 2023-10-05 MED ORDER — SENNOSIDES-DOCUSATE SODIUM 8.6-50 MG PO TABS: 2.0000 | ORAL_TABLET | Freq: Two times a day (BID) | ORAL | 0 refills | Status: DC | PRN

## 2023-10-05 MED ORDER — IBUPROFEN 600 MG PO TABS: 600.0000 mg | ORAL_TABLET | Freq: Four times a day (QID) | ORAL | 0 refills | Status: DC

## 2023-10-05 MED ORDER — OXYCODONE HCL 5 MG PO TABS: 5.0000 mg | ORAL_TABLET | ORAL | 0 refills | Status: DC | PRN

## 2023-10-05 MED ORDER — OXYCODONE HCL 5 MG PO CAPS: 5.0000 mg | ORAL_CAPSULE | ORAL | 0 refills | Status: DC | PRN

## 2023-10-05 MED ORDER — ACETAMINOPHEN 500 MG PO TABS: 1000.0000 mg | ORAL_TABLET | Freq: Four times a day (QID) | ORAL | 0 refills | Status: DC

## 2023-10-05 MED ORDER — GABAPENTIN 100 MG PO CAPS: 200.0000 mg | ORAL_CAPSULE | Freq: Every day | ORAL | 0 refills | Status: DC

## 2023-10-05 NOTE — Plan of Care (Signed)
Problem: Education: Goal: Knowledge of General Education information will improve Description: Including pain rating scale, medication(s)/side effects and non-pharmacologic comfort measures 10/05/2023 1455 by Donne Hazel, LPN Outcome: Adequate for Discharge 10/05/2023 0745 by Donne Hazel, LPN Outcome: Progressing   Problem: Health Behavior/Discharge Planning: Goal: Ability to manage health-related needs will improve 10/05/2023 1455 by Donne Hazel, LPN Outcome: Adequate for Discharge 10/05/2023 0745 by Donne Hazel, LPN Outcome: Progressing   Problem: Clinical Measurements: Goal: Ability to maintain clinical measurements within normal limits will improve 10/05/2023 1455 by Donne Hazel, LPN Outcome: Adequate for Discharge 10/05/2023 0745 by Donne Hazel, LPN Outcome: Progressing Goal: Will remain free from infection 10/05/2023 1455 by Donne Hazel, LPN Outcome: Adequate for Discharge 10/05/2023 0745 by Donne Hazel, LPN Outcome: Progressing Goal: Diagnostic test results will improve 10/05/2023 1455 by Donne Hazel, LPN Outcome: Adequate for Discharge 10/05/2023 0745 by Donne Hazel, LPN Outcome: Progressing Goal: Respiratory complications will improve 10/05/2023 1455 by Donne Hazel, LPN Outcome: Adequate for Discharge 10/05/2023 0745 by Donne Hazel, LPN Outcome: Progressing Goal: Cardiovascular complication will be avoided 10/05/2023 1455 by Donne Hazel, LPN Outcome: Adequate for Discharge 10/05/2023 0745 by Donne Hazel, LPN Outcome: Progressing   Problem: Activity: Goal: Risk for activity intolerance will decrease 10/05/2023 1455 by Donne Hazel, LPN Outcome: Adequate for Discharge 10/05/2023 0745 by Donne Hazel, LPN Outcome: Progressing   Problem: Nutrition: Goal: Adequate nutrition will be maintained 10/05/2023 1455 by Donne Hazel, LPN Outcome: Adequate for Discharge 10/05/2023 0745 by Donne Hazel, LPN Outcome: Progressing   Problem:  Coping: Goal: Level of anxiety will decrease 10/05/2023 1455 by Donne Hazel, LPN Outcome: Adequate for Discharge 10/05/2023 0745 by Donne Hazel, LPN Outcome: Progressing   Problem: Elimination: Goal: Will not experience complications related to bowel motility 10/05/2023 1455 by Donne Hazel, LPN Outcome: Adequate for Discharge 10/05/2023 0745 by Donne Hazel, LPN Outcome: Progressing Goal: Will not experience complications related to urinary retention 10/05/2023 1455 by Donne Hazel, LPN Outcome: Adequate for Discharge 10/05/2023 0745 by Donne Hazel, LPN Outcome: Progressing   Problem: Pain Managment: Goal: General experience of comfort will improve and/or be controlled 10/05/2023 1455 by Donne Hazel, LPN Outcome: Adequate for Discharge 10/05/2023 0745 by Donne Hazel, LPN Outcome: Progressing   Problem: Safety: Goal: Ability to remain free from injury will improve 10/05/2023 1455 by Donne Hazel, LPN Outcome: Adequate for Discharge 10/05/2023 0745 by Donne Hazel, LPN Outcome: Progressing   Problem: Skin Integrity: Goal: Risk for impaired skin integrity will decrease 10/05/2023 1455 by Donne Hazel, LPN Outcome: Adequate for Discharge 10/05/2023 0745 by Donne Hazel, LPN Outcome: Progressing   Problem: Education: Goal: Knowledge of the prescribed therapeutic regimen will improve 10/05/2023 1455 by Donne Hazel, LPN Outcome: Adequate for Discharge 10/05/2023 0745 by Donne Hazel, LPN Outcome: Progressing Goal: Understanding of sexual limitations or changes related to disease process or condition will improve 10/05/2023 1455 by Donne Hazel, LPN Outcome: Adequate for Discharge 10/05/2023 0745 by Donne Hazel, LPN Outcome: Progressing Goal: Individualized Educational Video(s) 10/05/2023 1455 by Donne Hazel, LPN Outcome: Adequate for Discharge 10/05/2023 0745 by Donne Hazel, LPN Outcome: Progressing   Problem: Self-Concept: Goal: Communication of feelings  regarding changes in body function or appearance will improve 10/05/2023 1455 by Donne Hazel, LPN Outcome: Adequate for Discharge 10/05/2023 0745 by Donne Hazel, LPN Outcome: Progressing  Problem: Skin Integrity: Goal: Demonstration of wound healing without infection will improve 10/05/2023 1455 by Donne Hazel, LPN Outcome: Adequate for Discharge 10/05/2023 0745 by Donne Hazel, LPN Outcome: Progressing   Problem: Education: Goal: Knowledge of condition will improve 10/05/2023 1455 by Donne Hazel, LPN Outcome: Adequate for Discharge 10/05/2023 0745 by Donne Hazel, LPN Outcome: Progressing Goal: Individualized Educational Video(s) 10/05/2023 1455 by Donne Hazel, LPN Outcome: Adequate for Discharge 10/05/2023 0745 by Donne Hazel, LPN Outcome: Progressing Goal: Individualized Newborn Educational Video(s) 10/05/2023 1455 by Donne Hazel, LPN Outcome: Adequate for Discharge 10/05/2023 0745 by Donne Hazel, LPN Outcome: Progressing   Problem: Activity: Goal: Will verbalize the importance of balancing activity with adequate rest periods 10/05/2023 1455 by Donne Hazel, LPN Outcome: Adequate for Discharge 10/05/2023 0745 by Donne Hazel, LPN Outcome: Progressing Goal: Ability to tolerate increased activity will improve 10/05/2023 1455 by Donne Hazel, LPN Outcome: Adequate for Discharge 10/05/2023 0745 by Donne Hazel, LPN Outcome: Progressing   Problem: Coping: Goal: Ability to identify and utilize available resources and services will improve 10/05/2023 1455 by Donne Hazel, LPN Outcome: Adequate for Discharge 10/05/2023 0745 by Donne Hazel, LPN Outcome: Progressing   Problem: Life Cycle: Goal: Chance of risk for complications during the postpartum period will decrease 10/05/2023 1455 by Donne Hazel, LPN Outcome: Adequate for Discharge 10/05/2023 0745 by Donne Hazel, LPN Outcome: Progressing   Problem: Role Relationship: Goal: Ability to demonstrate  positive interaction with newborn will improve 10/05/2023 1455 by Donne Hazel, LPN Outcome: Adequate for Discharge 10/05/2023 0745 by Donne Hazel, LPN Outcome: Progressing   Problem: Skin Integrity: Goal: Demonstration of wound healing without infection will improve 10/05/2023 1455 by Donne Hazel, LPN Outcome: Adequate for Discharge 10/05/2023 0745 by Donne Hazel, LPN Outcome: Progressing

## 2023-10-05 NOTE — Progress Notes (Signed)
Received call >2hrs after d/c that pt was informed pharmacy did not have oxycodone in stock at the [facility].  Requested it be sent to [facility].  Called [facility], confirmed that pt had not filled prescription as not in stock and had prescription canceled on their end, also canceled in [facility]. Sending Oxycodone 5mg  Q4hrs PRN for breakthrough pain (10 tab total).   1. History of cesarean section (Primary) - oxycodone (OXY-IR) 5 MG capsule; Take 1 capsule (5 mg total) by mouth every 4 (four) hours as needed (breakthrough pain).  Dispense: 10 capsule; Refill: 0  Mittie Bodo, MD Family Medicine - Obstetrics Fellow

## 2023-10-05 NOTE — Lactation Note (Signed)
This note was copied from a baby's chart. Lactation Consultation Note  Patient Name: Melanie Strong [id number] Date: 10/05/2023 Age:37 hours Reason for consult: Follow-up assessment;Maternal discharge;Early term 37-38.6wks  37 wks, @ 46 hrs of life. Mom is experienced breast feeder who only stopped BF about 6 months ago. Discussed breasts responding to lactation hormones stronger with each pregnancy. Mom already noticing engorgement. Encouraged 7-10 % of weight loss normal, 9% high side of normal. Per mom baby sleepy @ breast. Encouraged starting with hand expression, using breast compression throughout feed, and offering both breasts. Discussed cluster feeding overnight/ early morning brings in our milk supply, shared expectations of milk coming in. Highlighted risk of engorgement. Discussed hand pump/express to soften breasts, motrin as anti-inflammatory, and ice packs for 10-20 minutes post feed/pumping if still over-full is the best treatments for inflamed/engorged breasts. Encouraged mom to keep working on big mouth latch with baby and use EBM or coconut oil after each feed. Hand pump provided as best pump to use if engorged to soften breast as needed.  Maternal Data Has patient been taught Hand Expression?: Yes Does the patient have breastfeeding experience prior to this delivery?: Yes  Feeding Mother's Current Feeding Choice: Breast Milk   Discharge Discharge Education: Engorgement and breast care Pump: Personal;Manual (Per mom has a pump @ home, hand pump provided with variety of flanges gfor mom to take home. Highlighted a hand pump can work better then baby or DEBP when engorged.)  Consult Status Consult Status: Complete Date: 10/05/23    Idamae Lusher 10/05/2023, 2:19 PM

## 2023-10-05 NOTE — Discharge Instructions (Signed)
 WHAT TO LOOK OUT FOR: Fever of 100.4 or above Mastitis: feels like flu and breasts hurt Infection: increased pain, swelling or redness Blood clots golf ball size or larger Postpartum depression   Congratulations on your newest addition!

## 2023-10-05 NOTE — Plan of Care (Signed)

## 2023-10-05 NOTE — Progress Notes (Signed)
POSTPARTUM PROGRESS NOTE  Subjective: Melanie Strong is a 38 y.o. [id number] s/p rLTCS at [redacted]w[redacted]d.  She reports she doing well. No acute events overnight. She denies any problems with ambulating, voiding or po intake. Denies nausea or vomiting. She has  passed flatus. Pain is well controlled.  Lochia is appropriate.  Objective: Blood pressure (!) 106/55, pulse 64, temperature 97.8 F (36.6 C), temperature source Oral, resp. rate 16, height 5\' 5"  (1.651 m), weight 86.6 kg, last menstrual period 01/11/2023, SpO2 99%, unknown if currently breastfeeding.  Physical Exam:  General: alert, cooperative and no distress Chest: no respiratory distress Abdomen: soft, non-tender  Uterine Fundus: firm, appropriately tender Extremities: No calf swelling or tenderness  trace edema  Recent Labs    10/03/23 1952 10/04/23 0508  HGB 12.1 10.1*  HCT 38.6 31.0*    Assessment/Plan: Melanie Strong is a 38 y.o. [id number] s/p rLTCS at [redacted]w[redacted]d.  Routine Postpartum Care: Doing well, pain well-controlled.  -- Continue routine care, lactation support  -- Contraception: undecided -- Feeding: breast  Dispo: Plan for discharge, today versus tomorrow. Pending Pediatric Service plan.  Lamont Snowball, MSN, CNM, RNC-OB Certified Nurse Midwife, [facility] 10/05/2023 6:14 AM

## 2023-10-06 ENCOUNTER — Other Ambulatory Visit: Payer: Self-pay | Admitting: Obstetrics and Gynecology

## 2023-10-06 ENCOUNTER — Encounter: Payer: Medicaid Other | Admitting: Advanced Practice Midwife

## 2023-10-06 MED ORDER — OXYCODONE HCL 5 MG PO TABS: 5.0000 mg | ORAL_TABLET | ORAL | 0 refills | Status: DC | PRN

## 2023-10-09 ENCOUNTER — Other Ambulatory Visit: Payer: Self-pay | Admitting: Obstetrics and Gynecology

## 2023-10-09 ENCOUNTER — Ambulatory Visit: Payer: Medicaid Other

## 2023-10-10 ENCOUNTER — Ambulatory Visit (INDEPENDENT_AMBULATORY_CARE_PROVIDER_SITE_OTHER): Payer: Medicaid Other | Admitting: Obstetrics & Gynecology

## 2023-10-10 MED ORDER — BISACODYL 10 MG RE SUPP: 10.0000 mg | RECTAL | 0 refills | Status: DC | PRN

## 2023-10-10 MED ORDER — FLEET PEDIATRIC 3.5-9.5 GM/59ML RE ENEM: 1.0000 | ENEMA | Freq: Once | RECTAL | 0 refills | Status: AC

## 2023-10-10 NOTE — Progress Notes (Signed)
 Subjective:     Melanie Strong is a 38 y.o. female who presents to the clinic 1 weeks status post C/S. Eating a regular diet without difficulty. Bowel movements are abnormal with no bowel movement since delivery . The patient is not having any pain.  The following portions of the patient's history were reviewed and updated as appropriate: allergies, current medications, past family history, past medical history, past social history, past surgical history, and problem list.  Review of Systems Pertinent items are noted in HPI.    Objective:    LMP 01/11/2023  General:  alert, cooperative, and no distress  Abdomen: soft, bowel sounds active, non-tender  Incision:   healing well, no drainage, no erythema, no hernia, no seroma, no swelling, no dehiscence, incision well approximated     Assessment:    Doing well postoperatively.   Plan:    1. Continue any current medications. 2. Wound care discussed. 3. Follow up 1 week for recheck.   Eveline Lynwood MATSU, MD

## 2023-10-11 ENCOUNTER — Inpatient Hospital Stay (HOSPITAL_COMMUNITY)
Admission: AD | Admit: 2023-10-11 | Discharge: 2023-10-11 | Discharge status: Against Medical Advice | Payer: Medicaid Other | Attending: Obstetrics and Gynecology | Admitting: Obstetrics and Gynecology

## 2023-10-11 ENCOUNTER — Encounter (HOSPITAL_COMMUNITY): Payer: Self-pay | Admitting: Obstetrics and Gynecology

## 2023-10-11 DIAGNOSIS — O9089 Other complications of the puerperium, not elsewhere classified: Secondary | ICD-10-CM | POA: Insufficient documentation

## 2023-10-11 DIAGNOSIS — G8918 Other acute postprocedural pain: Secondary | ICD-10-CM | POA: Diagnosis not present

## 2023-10-11 DIAGNOSIS — R109 Unspecified abdominal pain: Secondary | ICD-10-CM | POA: Diagnosis present

## 2023-10-11 LAB — COMPREHENSIVE METABOLIC PANEL
ALT: 25 U/L (ref 0–44)
AST: 25 U/L (ref 15–41)
Albumin: 3.2 g/dL — ABNORMAL LOW (ref 3.5–5.0)
Alkaline Phosphatase: 107 U/L (ref 38–126)
Anion gap: 12 (ref 5–15)
BUN: <5 mg/dL — ABNORMAL LOW (ref 6–20)
CO2: 22 mmol/L (ref 22–32)
Calcium: 9.1 mg/dL (ref 8.9–10.3)
Chloride: 105 mmol/L (ref 98–111)
Creatinine, Ser: 0.64 mg/dL (ref 0.44–1.00)
GFR, Estimated: >60 mL/min (ref >60)
Glucose, Bld: 87 mg/dL (ref 70–99)
Potassium: 3.8 mmol/L (ref 3.5–5.1)
Sodium: 139 mmol/L (ref 135–145)
Total Bilirubin: 0.7 mg/dL (ref 0.0–1.2)
Total Protein: 7 g/dL (ref 6.5–8.1)

## 2023-10-11 LAB — CBC WITH DIFFERENTIAL/PLATELET
Abs Immature Granulocytes: 0.06 10*3/uL (ref 0.00–0.07)
Basophils Absolute: 0 10*3/uL (ref 0.0–0.1)
Basophils Relative: 0 %
Eosinophils Absolute: 0.1 10*3/uL (ref 0.0–0.5)
Eosinophils Relative: 1 %
HCT: 34.7 % — ABNORMAL LOW (ref 36.0–46.0)
Hemoglobin: 11 g/dL — ABNORMAL LOW (ref 12.0–15.0)
Immature Granulocytes: 1 %
Lymphocytes Relative: 16 %
Lymphs Abs: 1.4 10*3/uL (ref 0.7–4.0)
MCH: 27.1 pg (ref 26.0–34.0)
MCHC: 31.7 g/dL (ref 30.0–36.0)
MCV: 85.5 fL (ref 80.0–100.0)
Monocytes Absolute: 0.8 10*3/uL (ref 0.1–1.0)
Monocytes Relative: 9 %
Neutro Abs: 6.1 10*3/uL (ref 1.7–7.7)
Neutrophils Relative %: 73 %
Platelets: 347 10*3/uL (ref 150–400)
RBC: 4.06 MIL/uL (ref 3.87–5.11)
RDW: 14.8 % (ref 11.5–15.5)
WBC: 8.4 10*3/uL (ref 4.0–10.5)
nRBC: 0 % (ref 0.0–0.2)

## 2023-10-11 LAB — LIPASE, BLOOD: Lipase: 28 U/L (ref 11–51)

## 2023-10-11 LAB — AMYLASE: Amylase: 44 U/L (ref 28–100)

## 2023-10-11 MED ORDER — HYDROMORPHONE HCL 1 MG/ML IJ SOLN
0.5000 mg | Freq: Once | INTRAMUSCULAR | Status: AC
  Administered 2023-10-11: 0.5 mg via INTRAVENOUS
  Filled 2023-10-11 (×2): qty 1

## 2023-10-11 MED ORDER — HYDROMORPHONE HCL 1 MG/ML IJ SOLN
0.5000 mg | Freq: Once | INTRAMUSCULAR | Status: AC
  Administered 2023-10-11: 0.5 mg via INTRAVENOUS
  Filled 2023-10-11: qty 1

## 2023-10-11 NOTE — MAU Note (Signed)
 After speaking to MD pt elects to leave AMA. AMA Form signed by Pt and MAU RN. Charge RN aware.  Vernadine Golas, RN

## 2023-10-11 NOTE — MAU Provider Note (Signed)
 History     CSN: [id number]  Arrival date and time: 10/11/23 1300   Event Date/Time   First Provider Initiated Contact with Patient 10/11/2023  1:21 PM   Chief Complaint  Patient presents with   Abdominal Pain   Back Pain   Constipation    HPI  Melanie Strong is a 38 y.o. [id number] s/p rLTCS on 1/31 who presents to the MAU for abdominal pain. Onset 3 days ago, worsening. Pain throughout abdomen which is constant and goes into back. She had an unremarkable postpartum course. No fevers, chills, n/v. Reports last BM was prior to delivery. Was seen in clinic yesterday, was prescribed a Fleet enema which she used, but only had small amount of stool. No relief with enema use. Reports use of Ibuprofen  and Tylenol , used up all of her Roxicodone . Not on a bowel regimen. Has had flatus.  Past Medical History:  Diagnosis Date   Anemia    Diabetes mellitus without complication (HCC)    Genital herpes 07/11/2021   Gestational diabetes    History of ileus    Pregnancy induced hypertension     Past Surgical History:  Procedure Laterality Date   CESAREAN SECTION  08/23/2021   Procedure: CESAREAN SECTION;  Surgeon: Eldonna Suzen Octave, MD;  Location: [facility] LD ORS;  Service: Obstetrics;;   CESAREAN SECTION N/A 10/04/2022   Procedure: CESAREAN SECTION;  Surgeon: Eveline Lynwood MATSU, MD;  Location: [facility] LD ORS;  Service: Obstetrics;  Laterality: N/A;   CESAREAN SECTION N/A 10/03/2023   Procedure: CESAREAN SECTION;  Surgeon: Cris Burnard DEL, MD;  Location: [facility] LD ORS;  Service: Obstetrics;  Laterality: N/A;    Family History  Problem Relation Age of Onset   Hypertension Mother    Diabetes Mother        Boarderline   Lupus Mother        2nd opinion says no/ unsure   Cancer Father        rectal   Sickle cell anemia Sister    Healthy Brother    Healthy Brother    Cancer Paternal Aunt        brain   Cancer Paternal Uncle        stomach   Diabetes Maternal Grandmother    Other Neg Hx    Asthma  Neg Hx    Heart disease Neg Hx    Stroke Neg Hx     Social History   Tobacco Use   Smoking status: Some Days    Current packs/day: 0.25    Average packs/day: 0.3 packs/day for 10.0 years (2.5 ttl pk-yrs)    Types: Cigarettes    Passive exposure: Never   Smokeless tobacco: Never  Vaping Use   Vaping status: Every Day  Substance Use Topics   Alcohol use: No    Comment: rare   Drug use: No    Allergies: No Known Allergies  Medications Prior to Admission  Medication Sig Dispense Refill Last Dose/Taking   acetaminophen  (TYLENOL ) 500 MG tablet Take 2 tablets (1,000 mg total) by mouth every 6 (six) hours. 30 tablet 0 10/11/2023 Morning   ibuprofen  (ADVIL ) 600 MG tablet Take 1 tablet (600 mg total) by mouth every 6 (six) hours. 30 tablet 0 10/11/2023 Morning   oxycodone  (OXY-IR) 5 MG capsule Take 1 capsule (5 mg total) by mouth every 4 (four) hours as needed (breakthrough pain). 10 capsule 0 10/10/2023   oxyCODONE  (ROXICODONE ) 5 MG immediate release tablet Take 1 tablet (5 mg  total) by mouth every 4 (four) hours as needed for severe pain (pain score 7-10). 20 tablet 0 10/10/2023   Prenatal Vit-Fe Fumarate-FA (MULTIVITAMIN-PRENATAL) 27-0.8 MG TABS tablet Take 1 tablet by mouth daily before breakfast. 30 tablet 12 10/11/2023   bisacodyl  (DULCOLAX) 10 MG suppository Place 1 suppository (10 mg total) rectally as needed for moderate constipation. 12 suppository 0 Unknown   gabapentin  (NEURONTIN ) 100 MG capsule Take 2 capsules (200 mg total) by mouth at bedtime. 10 capsule 0 Unknown   senna-docusate (SENOKOT-S) 8.6-50 MG tablet Take 2 tablets by mouth 2 (two) times daily as needed for mild constipation. 60 tablet 0 Unknown    ROS reviewed and pertinent positives and negatives as documented in HPI.  Physical Exam   Blood pressure 134/70, pulse 83, temperature 98.5 F (36.9 C), temperature source Oral, resp. rate 20, height 5' 5 (1.651 m), weight 81 kg, SpO2 98%, currently breastfeeding.  Physical  Exam Constitutional:      General: She is not in acute distress.    Appearance: Normal appearance. She is not ill-appearing.     Comments: Uncomfortable, standing at edge of bed bent over secondary to pain  HENT:     Head: Normocephalic and atraumatic.  Cardiovascular:     Rate and Rhythm: Normal rate.  Pulmonary:     Effort: Pulmonary effort is normal.     Breath sounds: Normal breath sounds.  Abdominal:     General: There is distension (mild).     Palpations: Abdomen is soft.     Tenderness: There is generalized abdominal tenderness. There is no guarding or rebound.  Musculoskeletal:        General: Normal range of motion.  Skin:    General: Skin is warm and dry.     Findings: No rash.  Neurological:     General: No focal deficit present.     Mental Status: She is alert and oriented to person, place, and time.     MAU Course  Procedures  MDM 38 y.o. [id number] s/p rLTCS at [redacted]w[redacted]d on 10/03/23. Had uncomplicated postpartum course, however her pain began worsening over past three days. Pt appears uncomfortable, but vital signs are reassuring. Her exam is notable for slightly distended abdomen, but no concern for acute abdomen. Will check labs and eval for postsurgical etiology for abd pain. Given a one time dose of IV Dilaudid  to help pain and allow pt to tolerate CT abdomen/pelvis. Suspect constipation driving abd pain, but cannot rule out postsurgical etiology.   1629  Labs back -- CBC wnl, CMP wnl, lipase/amylase neg. Awaiting CT A/P.  2034  At bedside to discuss plan of care with patient. Patient is frustrated because of wait time today for CT A/P. She was just given PO contrast to drink and is frustrated about time course required for CT to be done. I discussed rationale for obtaining CT A/P, she expressed understanding. She stated she was not willing to wait at this time and planned to leave AMA. I asked if any other comfort measures could help decrease discomfort during wait  time, but she declined. She acknowledged pain would likely worsen when she returned home, but planned to be seen in another ER if this occurs. I discussed return precautions. Pt left AMA - papers signed.  Assessment and Plan  Postoperative abdominal pain Unclear etiology - post-operative complication unable to be ruled out, pain better after IV Dilaudid , but given small doses bec of concern for constipation causing sxs Labs wnl CT  A/P ordered, but pt unable to wait for scan to be done and ultimately left AMA   Alain Sor, MD OB Fellow, [facility], [facility] for [facility]  10/11/2023, 3:17 PM

## 2023-10-11 NOTE — MAU Note (Addendum)
 Melanie Strong is a 38 y.o. at [redacted]w[redacted]d here in MAU reporting: R c/s on 1/31. Extreme pain in abd and lower back.  Can't even lay day.  Is not incisional, is all over.  Started 3 days ago.  Saw dr yesterday, he wanted her to come in then, but she thought she would tough it out.  Getting worse. Is urinating ok.  Had not had a bowel movement since before delivery.  Was told to get an enema- got a fleets, did it yesterday, small results.  Onset of complaint: 3 days ago.  Pain score: 10 Vitals:   10/11/23 1322  BP: 134/70  Pulse: 83  Resp: 20  Temp: 98.5 F (36.9 C)  SpO2: 98%      Lab orders placed from triage:

## 2023-10-12 ENCOUNTER — Encounter: Payer: Self-pay | Admitting: Nurse Practitioner

## 2023-10-14 ENCOUNTER — Encounter: Payer: Medicaid Other | Admitting: Obstetrics & Gynecology

## 2023-10-15 ENCOUNTER — Ambulatory Visit: Payer: Medicaid Other | Admitting: Family Medicine

## 2023-10-15 VITALS — BP 144/84 | HR 66

## 2023-10-15 DIAGNOSIS — G8918 Other acute postprocedural pain: Secondary | ICD-10-CM | POA: Diagnosis not present

## 2023-10-15 DIAGNOSIS — O165 Unspecified maternal hypertension, complicating the puerperium: Secondary | ICD-10-CM

## 2023-10-15 DIAGNOSIS — N9489 Other specified conditions associated with female genital organs and menstrual cycle: Secondary | ICD-10-CM | POA: Diagnosis not present

## 2023-10-15 MED ORDER — AMLODIPINE BESYLATE 2.5 MG PO TABS: 2.5000 mg | ORAL_TABLET | Freq: Every day | ORAL | 0 refills | Status: DC

## 2023-10-15 MED ORDER — KETOROLAC TROMETHAMINE 30 MG/ML IJ SOLN
30.0000 mg | Freq: Once | INTRAMUSCULAR | Status: AC
  Administered 2023-10-15: 30 mg via INTRAMUSCULAR

## 2023-10-15 MED ORDER — KETOROLAC TROMETHAMINE 30 MG/ML IJ SOLN: 30.0000 mg | Freq: Once | INTRAMUSCULAR | Status: DC

## 2023-10-15 MED ORDER — CYCLOBENZAPRINE HCL 10 MG PO TABS: 10.0000 mg | ORAL_TABLET | Freq: Three times a day (TID) | ORAL | 0 refills | Status: DC | PRN

## 2023-10-15 NOTE — Progress Notes (Signed)
Pt is in office for f/u wound check.  Pt saw Dr Debroah Loop last week in severe pain post c/s with no BM since delivery. Pt was seen in MAU over the weekend.  Toradol given at today's appt.  Administrations This Visit     ketorolac (TORADOL) 30 MG/ML injection 30 mg     Admin Date 10/15/2023 Action Given Dose 30 mg Route Intramuscular Documented By Lanney Gins, CMA

## 2023-10-15 NOTE — Addendum Note (Signed)
Addended by: Mittie Bodo on: 10/15/2023 02:39 PM   Modules accepted: Orders

## 2023-10-15 NOTE — Progress Notes (Addendum)
PP GYNECOLOGY OFFICE VISIT NOTE  History:   Melanie Strong is a 38 y.o. [phone] here today for PP incision check and f/u to abdominal pain. Pt seen last week in s/o no BM and intense abdominal pain despite suppository prescribed by Dr. Debroah Loop.  She states she went in on Saturday however left before CT scan could be done.  States since Sunday has had at least one regular BM a day and significant improvement in symptoms.  She states she is still having pain on her uterus unlike what she's experienced with prior C/S. Denies F/C.  She denies any abnormal vaginal discharge, bleeding, pelvic pain or other concerns.    Past Medical History:  Diagnosis Date   Anemia    Diabetes mellitus without complication (HCC)    Genital herpes 07/11/2021   Gestational diabetes    History of ileus    Pregnancy induced hypertension     Past Surgical History:  Procedure Laterality Date   CESAREAN SECTION  08/23/2021   Procedure: CESAREAN SECTION;  Surgeon: [name], MD;  Location: [facility];  Service: Obstetrics;;   CESAREAN SECTION N/A 10/04/2022   Procedure: CESAREAN SECTION;  Surgeon: Adam Phenix, MD;  Location: [facility];  Service: Obstetrics;  Laterality: N/A;   CESAREAN SECTION N/A 10/03/2023   Procedure: CESAREAN SECTION;  Surgeon: Lesly Dukes, MD;  Location: [facility];  Service: Obstetrics;  Laterality: N/A;    The following portions of the patient's history were reviewed and updated as appropriate: allergies, current medications, past family history, past medical history, past social history, past surgical history and problem list.   Health Maintenance:  Normal pap and negative HRHPV on 01/2021.    Review of Systems:  Pertinent items noted in HPI and remainder of comprehensive ROS otherwise negative.  Physical Exam:  BP (!) 144/84   Pulse 66   LMP 01/11/2023  CONSTITUTIONAL: Well-developed, well-nourished female in no acute distress.  HEENT:  Normocephalic,  atraumatic. External right and left ear normal. No scleral icterus.  NECK: Normal range of motion, supple, no masses noted on observation SKIN: No rash noted. Not diaphoretic. No erythema. No pallor. MUSCULOSKELETAL: Normal range of motion. No edema noted. NEUROLOGIC: Alert and oriented to person, place, and time. Normal muscle tone coordination.  PSYCHIATRIC: Normal mood and affect. Normal behavior. Normal judgment and thought content. CARDIOVASCULAR: Normal heart rate noted RESPIRATORY: Effort and breath sounds normal, no problems with respiration noted ABDOMEN: No masses noted. No other overt distention noted. TTP over fundus of uterus, but firm fundus. Soft and not significantly tender abd exam. CS incision healing well, no concerns.  PELVIC: Deferred  Labs and Imaging Results for orders placed or performed during the hospital encounter of 10/11/23 (from the past week)  CBC with Differential/Platelet   Collection Time: 10/11/23  1:54 PM  Result Value Ref Range   WBC 8.4 4.0 - 10.5 K/uL   RBC 4.06 3.87 - 5.11 MIL/uL   Hemoglobin 11.0 (L) 12.0 - 15.0 g/dL   HCT 47.8 (L) 29.5 - 62.1 %   MCV 85.5 80.0 - 100.0 fL   MCH 27.1 26.0 - 34.0 pg   MCHC 31.7 30.0 - 36.0 g/dL   RDW 30.8 65.7 - 84.6 %   Platelets 347 150 - 400 K/uL   nRBC 0.0 0.0 - 0.2 %   Neutrophils Relative % 73 %   Neutro Abs 6.1 1.7 - 7.7 K/uL   Lymphocytes Relative 16 %  Lymphs Abs 1.4 0.7 - 4.0 K/uL   Monocytes Relative 9 %   Monocytes Absolute 0.8 0.1 - 1.0 K/uL   Eosinophils Relative 1 %   Eosinophils Absolute 0.1 0.0 - 0.5 K/uL   Basophils Relative 0 %   Basophils Absolute 0.0 0.0 - 0.1 K/uL   Immature Granulocytes 1 %   Abs Immature Granulocytes 0.06 0.00 - 0.07 K/uL  Comprehensive metabolic panel   Collection Time: 10/11/23  1:54 PM  Result Value Ref Range   Sodium 139 135 - 145 mmol/L   Potassium 3.8 3.5 - 5.1 mmol/L   Chloride 105 98 - 111 mmol/L   CO2 22 22 - 32 mmol/L   Glucose, Bld 87 70 - 99 mg/dL    BUN <5 (L) 6 - 20 mg/dL   Creatinine, Ser 1.19 0.44 - 1.00 mg/dL   Calcium 9.1 8.9 - 14.7 mg/dL   Total Protein 7.0 6.5 - 8.1 g/dL   Albumin 3.2 (L) 3.5 - 5.0 g/dL   AST 25 15 - 41 U/L   ALT 25 0 - 44 U/L   Alkaline Phosphatase 107 38 - 126 U/L   Total Bilirubin 0.7 0.0 - 1.2 mg/dL   GFR, Estimated >82 >95 mL/min   Anion gap 12 5 - 15  Lipase, blood   Collection Time: 10/11/23  1:54 PM  Result Value Ref Range   Lipase 28 11 - 51 U/L  Amylase   Collection Time: 10/11/23  1:54 PM  Result Value Ref Range   Amylase 44 28 - 100 U/L   [place] MFM FETAL BPP WO NON STRESS Result Date: 09/25/2023 ----------------------------------------------------------------------  OBSTETRICS REPORT                       (Signed Final 09/25/2023 12:48 pm) ---------------------------------------------------------------------- Patient Info  ID #:       [id number]                          D.O.B.:  May 21, 1986 (37 yrs)(F)  Name:       Melanie Strong                Visit Date: 09/25/2023 10:38 am ---------------------------------------------------------------------- Performed By  Attending:        Noralee Space MD        Ref. Address:     [address]                                                             [address]                                                             [place]  [address]  Performed By:     Emeline Darling BS,      [facility]  Referred By:      [facility] ---------------------------------------------------------------------- Orders  #  Description                           Code        Ordered By  1  [place] MFM FETAL BPP WO NON                76819.01    YU FANG     STRESS  2  [place] MFM OB FOLLOW UP                   76816.01    YU FANG ----------------------------------------------------------------------  #  Order #                     Accession #                Episode #  1  [id number]                   [id number]                 [id number]  2  [id number]                   [id number]                 [id number] ---------------------------------------------------------------------- Indications  Gestational diabetes in pregnancy,             O24.415  controlled by oral hypoglycemic drugs  (metformin)  Large for gestational age fetus affecting      O36.60X0  management of mother  Short interval between pregnancies, 3rd        O09.893  trimester  Fetal or maternal indication (Hx HSV)          O35.25XX0  Advanced maternal age multigravida 79+,        O34.523  third trimester (36)  History of cesarean delivery, currently        O34.219  pregnant X 2  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 Cord Insertion:      Previously seen  Amniotic Fluid  AFI FV:      Within normal limits  AFI Sum(cm)     %Tile       Largest Pocket(cm)  14.33           53          5.27  RUQ(cm)       RLQ(cm)       LUQ(cm)        LLQ(cm)  5.27          2.46          2.51           4.09 ---------------------------------------------------------------------- Biophysical Evaluation  Amniotic F.V:   Pocket => 2 cm             F. Tone:        Observed  F. Movement:    Observed                   Score:          8/8  F. Breathing:   Observed  ---------------------------------------------------------------------- Biometry  BPD:      91.3  mm     G. Age:  37w 0d         72  %    CI:        77.67   %    70 - 86                                                          FL/HC:      22.2   %    20.8 - 22.6  HC:      327.9  mm     G. Age:  37w 2d         35  %    HC/AC:      0.92        0.92 - 1.05  AC:      357.7  mm     G. Age:  39w 5d       > 99  %    FL/BPD:     79.7   %    71 - 87  FL:       72.8  mm     G. Age:  37w 2d         63  %    FL/AC:      20.4   %    20 - 24  Est. FW:    3523  gm    7 lb 12 oz      93  % ---------------------------------------------------------------------- OB History  Gravidity:    4         Term:   2  SAB:   1  Living:       2 ---------------------------------------------------------------------- Gestational Age  LMP:           36w 5d        Date:  01/11/23                  EDD:   10/18/23  U/S Today:     37w 6d                                        EDD:   10/10/23  Best:          36w 5d     Det. By:  U/S C R L  (03/17/23)    EDD:   10/18/23 ---------------------------------------------------------------------- Anatomy  Heart:                 Appears normal         Kidneys:                Appear normal                         (4CH, axis, and                         situs)  Diaphragm:             Appears normal         Bladder:                Appears normal  Stomach:               Appears normal, left                         sided ---------------------------------------------------------------------- Cervix Uterus Adnexa  Cervix  Not visualized (advanced GA >24wks) ---------------------------------------------------------------------- Impression  The estimated fetal weight is at the 93rd percentile and the  abdominal circumference measurement at the 99 percentile.  Amniotic fluid is normal and good fetal activity seen.  Cephalic presentation.  Antenatal testing is reassuring.  BPP  8/8.  Placenta is anterior and not low-lying.   Myometrial thinning is  observed in the lower part of the placenta but no lacunae are  seen.  Although there is low suspicion for placenta accreta  spectrum, focal accreta cannot be ruled out.  xxxxxxxxxxxxxxxxxxxxxxxxxxxxxxxxxxxxxxxxxxxxxx  Maternal-Fetal Medicine-Consultation  I had the pleasure of seeing Ms. Reed Breech [facility]. She is G4 P2012 at 36w 5d  gestation and is here for fetal growth assessment and BPP.  Her high risk problems include:  -Gestational diabetes.  Patient takes metformin 1000 mg  twice daily.  I reviewed her blood glucose log.  Most of her  fasting levels are increased between 110-120 mg/dL.  Most  postprandial levels are within normal range.  She had one  reading at 312 mg postprandial last week.  -History of 2 cesarean deliveries.  Patient is scheduled to  undergo repeat cesarean delivery on 10/13/2023.  Our concerns include:  Poorly controlled gestational diabetes  I counseled the patient on blood glucose levels and that  increased her fasting levels show poor control.  Complications of poorly controlled diabetes include stillbirth.  I discussed the option of inpatient management for control.  Insulin may not  be the appropriate choice at this gestational  age as outpatient management.  Patient does not want to get admitted.  Because of an increased risk for stillbirth with poorly  controlled diabetes, I recommend delivery at 37 to [redacted] weeks  gestation.  Patient agreed with my recommendations.  Previous cesarean deliveries  I explained that previous cesarean delivery since increase the  risk of placenta accreta spectrum.  Although the likelihood of  passage is low, focal accreta may be evident only at surgery.  If bleeding cannot be controlled, hysterectomy will be  performed.  Patient is aware that consent for hysterectomy will be taken.  Patient desires to have more children.  I discussed the  benefit of vaginal birth after cesarean delivery.   Trial of labor  is associated with scar rupture and 1% to 2% of cases.  She  had low transverse uterine incisions in both pregnancies.  Patient would like to have elective cesarean delivery. ---------------------------------------------------------------------- Recommendations  -Delivery at 37 to [redacted] weeks gestation.  Message will be sent  (in basket) to her Ob providers.  Patient has a prenatal visit  appointment next week.  -Anticipate focal placenta accreta spectrum and this is low  likelihood).  -Consent for cesarean hysterectomy should be taken.  -Blood should be available at short notice during delivery. ----------------------------------------------------------------------                 Noralee Space, MD Electronically Signed Final Report   09/25/2023 12:48 pm ----------------------------------------------------------------------   [place] MFM OB FOLLOW UP Result Date: 09/25/2023 ----------------------------------------------------------------------  OBSTETRICS REPORT                       (Signed Final 09/25/2023 12:48 pm) ---------------------------------------------------------------------- Patient Info  ID #:       [id number]                          D.O.B.:  09-22-1985 (37 yrs)(F)  Name:       LUZMARIA DEVAUX                Visit Date: 09/25/2023 10:38 am ---------------------------------------------------------------------- Performed By  Attending:        Noralee Space MD        Ref. Address:     [address]                                                             [address]                                                             [place]  [address]  Performed By:     Emeline Darling BS,      [facility]  Referred By:      [facility] ---------------------------------------------------------------------- Orders  #  Description                           Code        Ordered By  1  [place] MFM FETAL BPP WO NON               76819.01    YU FANG     STRESS  2  [place] MFM OB FOLLOW UP                   76816.01    YU FANG ----------------------------------------------------------------------  #  Order #                     Accession #                Episode #  1  [id number]                   [id number]                 [id number]  2  [id number]                   [id number]                 [id number] ---------------------------------------------------------------------- Indications  Gestational diabetes in pregnancy,             O24.415  controlled by oral hypoglycemic drugs  (metformin)  Large for gestational age fetus affecting      O36.60X0  management of mother  Short interval between pregnancies, 3rd        O09.893  trimester  Fetal or maternal indication (Hx HSV)          O35.80XX0  Advanced maternal age multigravida 48+,        O2.523  third trimester (36)  History of cesarean delivery, currently        O34.219  pregnant X 2  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 Cord Insertion:      Previously seen  Amniotic Fluid  AFI FV:      Within normal limits  AFI Sum(cm)     %Tile       Largest Pocket(cm)  14.33           53          5.27  RUQ(cm)       RLQ(cm)       LUQ(cm)        LLQ(cm)  5.27          2.46          2.51           4.09  ---------------------------------------------------------------------- Biophysical Evaluation  Amniotic F.V:   Pocket => 2 cm             F. Tone:        Observed  F. Movement:    Observed                   Score:          8/8  F. Breathing:   Observed ---------------------------------------------------------------------- Biometry  BPD:      91.3  mm     G. Age:  37w 0d         72  %    CI:        77.67   %    70 - 86                                                          FL/HC:      22.2   %    20.8 - 22.6  HC:      327.9  mm     G. Age:  37w 2d         35  %    HC/AC:      0.92        0.92 - 1.05  AC:      357.7  mm     G. Age:  39w 5d       > 99  %    FL/BPD:     79.7   %    71 - 87  FL:       72.8  mm     G. Age:  37w 2d         63  %    FL/AC:      20.4   %    20 - 24  Est. FW:    3523  gm    7 lb 12 oz      93  % ---------------------------------------------------------------------- OB History  Gravidity:    4         Term:   2  SAB:   1  Living:       2 ---------------------------------------------------------------------- Gestational Age  LMP:           36w 5d        Date:  01/11/23                  EDD:   10/18/23  U/S Today:     37w 6d                                        EDD:   10/10/23  Best:          36w 5d     Det. By:  U/S C R L  (03/17/23)    EDD:   10/18/23 ---------------------------------------------------------------------- Anatomy  Heart:                 Appears normal         Kidneys:                Appear normal                         (4CH, axis, and                         situs)  Diaphragm:             Appears normal         Bladder:                Appears normal  Stomach:               Appears normal, left                         sided ---------------------------------------------------------------------- Cervix Uterus Adnexa  Cervix  Not visualized (advanced GA >24wks) ---------------------------------------------------------------------- Impression  The estimated fetal weight is at  the 93rd percentile and the  abdominal circumference measurement at the 99 percentile.  Amniotic fluid is normal and good fetal activity seen.  Cephalic presentation.  Antenatal testing is reassuring.  BPP  8/8.  Placenta is anterior and not low-lying.  Myometrial thinning is  observed in the lower part of the placenta but no lacunae are  seen.  Although there is low suspicion for placenta accreta  spectrum, focal accreta cannot be ruled out.  xxxxxxxxxxxxxxxxxxxxxxxxxxxxxxxxxxxxxxxxxxxxxx  Maternal-Fetal Medicine-Consultation  I had the pleasure of seeing Ms. Reed Breech [facility]. She is G4 P2012 at 36w 5d  gestation and is here for fetal growth assessment and BPP.  Her high risk problems include:  -Gestational diabetes.  Patient takes metformin 1000 mg  twice daily.  I reviewed her blood glucose log.  Most of her  fasting levels are increased between 110-120 mg/dL.  Most  postprandial levels are within normal range.  She had one  reading at 312 mg postprandial last week.  -History of 2 cesarean deliveries.  Patient is scheduled to  undergo repeat cesarean delivery on 10/13/2023.  Our concerns include:  Poorly controlled gestational diabetes  I counseled the patient on blood glucose levels and that  increased her fasting levels show poor control.  Complications of poorly controlled diabetes include stillbirth.  I discussed the option of inpatient management for control.  Insulin may not  be the appropriate choice at this gestational  age as outpatient management.  Patient does not want to get admitted.  Because of an increased risk for stillbirth with poorly  controlled diabetes, I recommend delivery at 37 to [redacted] weeks  gestation.  Patient agreed with my recommendations.  Previous cesarean deliveries  I explained that previous cesarean delivery since increase the  risk of placenta accreta spectrum.  Although the likelihood of  passage is low, focal accreta may be evident only at  surgery.  If bleeding cannot be controlled, hysterectomy will be  performed.  Patient is aware that consent for hysterectomy will be taken.  Patient desires to have more children.  I discussed the  benefit of vaginal birth after cesarean delivery.  Trial of labor  is associated with scar rupture and 1% to 2% of cases.  She  had low transverse uterine incisions in both pregnancies.  Patient would like to have elective cesarean delivery. ---------------------------------------------------------------------- Recommendations  -Delivery at 37 to [redacted] weeks gestation.  Message will be sent  (in basket) to her Ob providers.  Patient has a prenatal visit  appointment next week.  -Anticipate focal placenta accreta spectrum and this is low  likelihood).  -Consent for cesarean hysterectomy should be taken.  -Blood should be available at short notice during delivery. ----------------------------------------------------------------------                 Noralee Space, MD Electronically Signed Final Report   09/25/2023 12:48 pm ----------------------------------------------------------------------      Assessment and Plan:      1. Uterine pain (Primary) - ketorolac (TORADOL) 30 MG/ML injection 30 mg - cyclobenzaprine (FLEXERIL) 10 MG tablet; Take 1 tablet (10 mg total) by mouth 3 (three) times daily as needed (headache).  Dispense: 30 tablet; Refill: 0 - counseled on no Ibuprofen for next 5 days  - counseled on infection signs and symptoms to present to MAU for  2. Postpartum hypertension - amLODipine (NORVASC) 2.5 MG tablet; Take 1 tablet (2.5 mg total) by mouth daily.  Dispense: 30 tablet; Refill: 0 - pt to take BP multiple times a day with BP log and will bring that at her full PP visit   Routine preventative health maintenance measures emphasized. Please refer to After Visit Summary for other counseling recommendations.   Return for previously scheduled PP visit.     Hessie Dibble, MD OB Fellow, Faculty  [facility], [facility] 10/15/2023 2:09 PM

## 2023-10-18 ENCOUNTER — Inpatient Hospital Stay (HOSPITAL_COMMUNITY): Payer: Medicaid Other

## 2023-10-18 ENCOUNTER — Inpatient Hospital Stay (HOSPITAL_COMMUNITY)
Admission: AD | Admit: 2023-10-18 | Discharge: 2023-10-18 | Disposition: A | Discharge status: Home / Self Care | Payer: Medicaid Other | Attending: Obstetrics & Gynecology | Admitting: Obstetrics & Gynecology

## 2023-10-18 DIAGNOSIS — Z7182 Exercise counseling: Secondary | ICD-10-CM | POA: Diagnosis not present

## 2023-10-18 DIAGNOSIS — G8918 Other acute postprocedural pain: Secondary | ICD-10-CM

## 2023-10-18 DIAGNOSIS — O165 Unspecified maternal hypertension, complicating the puerperium: Secondary | ICD-10-CM

## 2023-10-18 DIAGNOSIS — Q438 Other specified congenital malformations of intestine: Secondary | ICD-10-CM | POA: Diagnosis not present

## 2023-10-18 DIAGNOSIS — O9089 Other complications of the puerperium, not elsewhere classified: Secondary | ICD-10-CM | POA: Insufficient documentation

## 2023-10-18 DIAGNOSIS — M6208 Separation of muscle (nontraumatic), other site: Secondary | ICD-10-CM

## 2023-10-18 DIAGNOSIS — R9389 Abnormal findings on diagnostic imaging of other specified body structures: Secondary | ICD-10-CM | POA: Diagnosis not present

## 2023-10-18 DIAGNOSIS — Z79899 Other long term (current) drug therapy: Secondary | ICD-10-CM | POA: Insufficient documentation

## 2023-10-18 DIAGNOSIS — N281 Cyst of kidney, acquired: Secondary | ICD-10-CM | POA: Diagnosis not present

## 2023-10-18 DIAGNOSIS — R109 Unspecified abdominal pain: Secondary | ICD-10-CM | POA: Diagnosis not present

## 2023-10-18 LAB — CBC WITH DIFFERENTIAL/PLATELET
Abs Immature Granulocytes: 0.05 10*3/uL (ref 0.00–0.07)
Basophils Absolute: 0 10*3/uL (ref 0.0–0.1)
Basophils Relative: 1 %
Eosinophils Absolute: 0.1 10*3/uL (ref 0.0–0.5)
Eosinophils Relative: 2 %
HCT: 34.3 % — ABNORMAL LOW (ref 36.0–46.0)
Hemoglobin: 10.9 g/dL — ABNORMAL LOW (ref 12.0–15.0)
Immature Granulocytes: 1 %
Lymphocytes Relative: 32 %
Lymphs Abs: 2 10*3/uL (ref 0.7–4.0)
MCH: 26.9 pg (ref 26.0–34.0)
MCHC: 31.8 g/dL (ref 30.0–36.0)
MCV: 84.7 fL (ref 80.0–100.0)
Monocytes Absolute: 0.5 10*3/uL (ref 0.1–1.0)
Monocytes Relative: 9 %
Neutro Abs: 3.4 10*3/uL (ref 1.7–7.7)
Neutrophils Relative %: 55 %
Platelets: 335 10*3/uL (ref 150–400)
RBC: 4.05 MIL/uL (ref 3.87–5.11)
RDW: 14.9 % (ref 11.5–15.5)
WBC: 6.1 10*3/uL (ref 4.0–10.5)
nRBC: 0 % (ref 0.0–0.2)

## 2023-10-18 LAB — COMPREHENSIVE METABOLIC PANEL
ALT: 15 U/L (ref 0–44)
AST: 29 U/L (ref 15–41)
Albumin: 3 g/dL — ABNORMAL LOW (ref 3.5–5.0)
Alkaline Phosphatase: 69 U/L (ref 38–126)
Anion gap: 10 (ref 5–15)
BUN: 11 mg/dL (ref 6–20)
CO2: 22 mmol/L (ref 22–32)
Calcium: 8.6 mg/dL — ABNORMAL LOW (ref 8.9–10.3)
Chloride: 107 mmol/L (ref 98–111)
Creatinine, Ser: 0.7 mg/dL (ref 0.44–1.00)
GFR, Estimated: >60 mL/min (ref >60)
Glucose, Bld: 83 mg/dL (ref 70–99)
Potassium: 4.9 mmol/L (ref 3.5–5.1)
Sodium: 139 mmol/L (ref 135–145)
Total Bilirubin: 0.9 mg/dL (ref 0.0–1.2)
Total Protein: 6.3 g/dL — ABNORMAL LOW (ref 6.5–8.1)

## 2023-10-18 MED ORDER — IOHEXOL 9 MG/ML PO SOLN
500.0000 mL | ORAL | Status: AC
Start: 2023-10-18 — End: 2023-10-18
  Administered 2023-10-18 (×2): 500 mL via ORAL

## 2023-10-18 MED ORDER — IOHEXOL 350 MG/ML SOLN: 75.0000 mL | Freq: Once | INTRAVENOUS | Status: DC | PRN

## 2023-10-18 MED ORDER — IOHEXOL 9 MG/ML PO SOLN
ORAL | Status: AC
  Filled 2023-10-18: qty 1000

## 2023-10-18 MED ORDER — TRAMADOL HCL 50 MG PO TABS
50.0000 mg | ORAL_TABLET | Freq: Once | ORAL | Status: AC
  Administered 2023-10-18: 50 mg via ORAL
  Filled 2023-10-18: qty 1

## 2023-10-18 MED ORDER — IOHEXOL 12 MG/ML PO SOLN: 500.0000 mL | ORAL | Status: DC

## 2023-10-18 MED ORDER — GABAPENTIN 100 MG PO CAPS: 200.0000 mg | ORAL_CAPSULE | Freq: Two times a day (BID) | ORAL | 0 refills | Status: DC

## 2023-10-18 NOTE — MAU Note (Signed)
Notified CT that patient completed her second bottle of oral contrast at 1743. CT stated they would request transport for the patient soon.

## 2023-10-18 NOTE — MAU Provider Note (Signed)
History     CSN: [id number]  Arrival date and time: 10/18/23 1344   Event Date/Time   First Provider Initiated Contact with Patient 10/18/23 1431      Chief Complaint  Patient presents with   Incisional Pain   Wound Check   Abdominal Pain   HPI Melanie Strong is a 38 y.o. [id number] at 2 weeks postpartum who presents for abdominal pain. Reports constant abdominal pain throughout her abdomen since her delivery. Was seen last week for same but left prior to having CT performed. States overall, pain has improved but she continued to need medication. Received Toradol injection & flexeril prescription in office on Wednesday. States she took the flexeril once but it made her dizzy so hasn't taken it again.  Denies fever, n/v/d, constipation, dysuria, vaginal bleeding.  Was started on procardia this week for postpartum hypertension. Hasn't taken today's dose yet. Denies headache, visual disturbance, or epigastric pain.   OB History     Gravida  4   Para  3   Term  3   Preterm      AB  1   Living  3      SAB  1   IAB  0   Ectopic  0   Multiple  0   Live Births  3           Past Medical History:  Diagnosis Date   Anemia    Diabetes mellitus without complication (HCC)    Genital herpes 07/11/2021   Gestational diabetes    History of ileus    Pregnancy induced hypertension     Past Surgical History:  Procedure Laterality Date   CESAREAN SECTION  08/23/2021   Procedure: CESAREAN SECTION;  Surgeon: [name], MD;  Location: [facility];  Service: Obstetrics;;   CESAREAN SECTION N/A 10/04/2022   Procedure: CESAREAN SECTION;  Surgeon: Adam Phenix, MD;  Location: [facility];  Service: Obstetrics;  Laterality: N/A;   CESAREAN SECTION N/A 10/03/2023   Procedure: CESAREAN SECTION;  Surgeon: Lesly Dukes, MD;  Location: [facility];  Service: Obstetrics;  Laterality: N/A;    Family History  Problem Relation Age of Onset   Hypertension Mother     Diabetes Mother        Boarderline   Lupus Mother        2nd opinion says no/ unsure   Cancer Father        rectal   Sickle cell anemia Sister    Healthy Brother    Healthy Brother    Cancer Paternal Aunt        brain   Cancer Paternal Uncle        stomach   Diabetes Maternal Grandmother    Other Neg Hx    Asthma Neg Hx    Heart disease Neg Hx    Stroke Neg Hx     Social History   Tobacco Use   Smoking status: Some Days    Current packs/day: 0.25    Average packs/day: 0.3 packs/day for 10.0 years (2.5 ttl pk-yrs)    Types: Cigarettes    Passive exposure: Never   Smokeless tobacco: Never  Vaping Use   Vaping status: Every Day  Substance Use Topics   Alcohol use: No    Comment: rare   Drug use: No    Allergies: No Known Allergies  Medications Prior to Admission  Medication Sig Dispense Refill Last Dose/Taking   acetaminophen (TYLENOL)  500 MG tablet Take 2 tablets (1,000 mg total) by mouth every 6 (six) hours. 30 tablet 0    amLODipine (NORVASC) 2.5 MG tablet Take 1 tablet (2.5 mg total) by mouth daily. 30 tablet 0    bisacodyl (DULCOLAX) 10 MG suppository Place 1 suppository (10 mg total) rectally as needed for moderate constipation. 12 suppository 0    cyclobenzaprine (FLEXERIL) 10 MG tablet Take 1 tablet (10 mg total) by mouth 3 (three) times daily as needed (headache). 30 tablet 0    gabapentin (NEURONTIN) 100 MG capsule Take 2 capsules (200 mg total) by mouth at bedtime. 10 capsule 0    oxycodone (OXY-IR) 5 MG capsule Take 1 capsule (5 mg total) by mouth every 4 (four) hours as needed (breakthrough pain). 10 capsule 0    oxyCODONE (ROXICODONE) 5 MG immediate release tablet Take 1 tablet (5 mg total) by mouth every 4 (four) hours as needed for severe pain (pain score 7-10). 20 tablet 0    Prenatal Vit-Fe Fumarate-FA (MULTIVITAMIN-PRENATAL) 27-0.8 MG TABS tablet Take 1 tablet by mouth daily before breakfast. 30 tablet 12    senna-docusate (SENOKOT-S) 8.6-50 MG tablet  Take 2 tablets by mouth 2 (two) times daily as needed for mild constipation. 60 tablet 0     Review of Systems  All other systems reviewed and are negative.  Physical Exam   Blood pressure (!) 142/79, pulse 63, temperature 98.2 F (36.8 C), temperature source Oral, resp. rate 16, height 5\' 5"  (1.651 m), weight 79.8 kg, SpO2 100%, currently breastfeeding.  Physical Exam Vitals and nursing note reviewed.  Constitutional:      General: She is not in acute distress.    Appearance: She is well-developed. She is not ill-appearing.  HENT:     Head: Normocephalic and atraumatic.  Eyes:     General: No scleral icterus.       Right eye: No discharge.        Left eye: No discharge.     Conjunctiva/sclera: Conjunctivae normal.  Pulmonary:     Effort: Pulmonary effort is normal. No respiratory distress.  Abdominal:     Palpations: Abdomen is soft.     Tenderness: There is generalized abdominal tenderness. There is no guarding or rebound.     Comments: Incision clean & intact  Skin:    General: Skin is warm and dry.  Neurological:     General: No focal deficit present.     Mental Status: She is alert.  Psychiatric:        Mood and Affect: Mood normal.        Behavior: Behavior normal.     MAU Course  Procedures Results for orders placed or performed during the hospital encounter of 10/18/23 (from the past 24 hours)  CBC with Differential/Platelet     Status: Abnormal   Collection Time: 10/18/23  3:04 PM  Result Value Ref Range   WBC 6.1 4.0 - 10.5 K/uL   RBC 4.05 3.87 - 5.11 MIL/uL   Hemoglobin 10.9 (L) 12.0 - 15.0 g/dL   HCT 16.1 (L) 09.6 - 04.5 %   MCV 84.7 80.0 - 100.0 fL   MCH 26.9 26.0 - 34.0 pg   MCHC 31.8 30.0 - 36.0 g/dL   RDW 40.9 81.1 - 91.4 %   Platelets 335 150 - 400 K/uL   nRBC 0.0 0.0 - 0.2 %   Neutrophils Relative % 55 %   Neutro Abs 3.4 1.7 - 7.7 K/uL   Lymphocytes Relative  32 %   Lymphs Abs 2.0 0.7 - 4.0 K/uL   Monocytes Relative 9 %   Monocytes  Absolute 0.5 0.1 - 1.0 K/uL   Eosinophils Relative 2 %   Eosinophils Absolute 0.1 0.0 - 0.5 K/uL   Basophils Relative 1 %   Basophils Absolute 0.0 0.0 - 0.1 K/uL   Immature Granulocytes 1 %   Abs Immature Granulocytes 0.05 0.00 - 0.07 K/uL  Comprehensive metabolic panel     Status: Abnormal   Collection Time: 10/18/23  3:04 PM  Result Value Ref Range   Sodium 139 135 - 145 mmol/L   Potassium 4.9 3.5 - 5.1 mmol/L   Chloride 107 98 - 111 mmol/L   CO2 22 22 - 32 mmol/L   Glucose, Bld 83 70 - 99 mg/dL   BUN 11 6 - 20 mg/dL   Creatinine, Ser 5.28 0.44 - 1.00 mg/dL   Calcium 8.6 (L) 8.9 - 10.3 mg/dL   Total Protein 6.3 (L) 6.5 - 8.1 g/dL   Albumin 3.0 (L) 3.5 - 5.0 g/dL   AST 29 15 - 41 U/L   ALT 15 0 - 44 U/L   Alkaline Phosphatase 69 38 - 126 U/L   Total Bilirubin 0.9 0.0 - 1.2 mg/dL   GFR, Estimated >41 >32 mL/min   Anion gap 10 5 - 15   CT ABDOMEN PELVIS W CONTRAST Result Date: 10/18/2023 CLINICAL DATA:  Abdominal pain, postop. Caesarean section 10/02/2023 EXAM: CT ABDOMEN AND PELVIS WITH CONTRAST TECHNIQUE: Multidetector CT imaging of the abdomen and pelvis was performed using the standard protocol following bolus administration of intravenous contrast. RADIATION DOSE REDUCTION: This exam was performed according to the departmental dose-optimization program which includes automated exposure control, adjustment of the mA and/or kV according to patient size and/or use of iterative reconstruction technique. CONTRAST:  75 cc Omnipaque 350 IV COMPARISON:  CT 01/11/2016 FINDINGS: Lower chest: Lung bases.  The heart is normal in size. Hepatobiliary: Scattered low-density lesions in the liver. These are incompletely characterized on the current exam. Favor hemangiomas or small cysts. Gallbladder physiologically distended, no calcified stone. No biliary dilatation. Pancreas: Unremarkable. No pancreatic ductal dilatation or surrounding inflammatory changes. Spleen: Normal in size without focal  abnormality. Adrenals/Urinary Tract: Normal adrenal glands. No hydronephrosis or perinephric edema. Homogeneous renal enhancement with symmetric excretion on delayed phase imaging. Simple cyst in the right kidney. No further follow-up imaging is recommended. Urinary bladder is physiologically distended without wall thickening. Stomach/Bowel: Decompressed stomach. Administered enteric contrast has reached the colon. There are few prominent loops of small bowel bladder contrast filled, but no obstruction. Small to moderate colonic stool burden. The sigmoid colon is redundant. The appendix is normal. Vascular/Lymphatic: No acute vascular findings. Normal caliber abdominal aorta. The portal vein is patent. No adenopathy. Small bilateral inguinal nodes are not enlarged by size criteria. Reproductive: The uterus is prominent in size likely related to recent postpartum state. Caesarean section scar noted. Small amount of fluid in the endometrial canal. There is no abnormal enhancement. The ovaries are not clearly defined on the current exam. Other: Heterogeneous thickening and enlargement of the lower anterior abdominal wall musculature in the region of Caesarean section scar. There is no discrete intramuscular fluid collection. Mild stranding of the overlying subcutaneous tissues without subcutaneous collection. No subcutaneous or intramuscular gas. No intra-abdominopelvic fluid collection or free air. Trace free fluid in the pelvis. No significant ascites. Mild rectus diastasis. Musculoskeletal: There are no acute or suspicious osseous abnormalities. IMPRESSION: 1. Heterogeneous thickening  and enlargement of the lower anterior abdominal wall musculature in the region of Caesarean section incision. This may be related to recent surgery, although infection could have a similar appearance. Sterility is indeterminate by imaging. No discrete fluid collection or soft tissue gas. 2. Expected postpartum appearance of the uterus.  3. Occasional prominent small bowel but no obstruction, with enteric contrast reaching the colon. Electronically Signed   By: Narda Rutherford M.D.   On: 10/18/2023 19:35    MDM Labs Oral med Ct imaging Rx meds Assessment and Plan   1. Postpartum hypertension  -Intake BP mild range, repeat BPs normal. Asymptomatic with normal labs. Encouraged to take antihypertensive as prescribed.   2. Diastasis recti  -Recommend abdominal binder. Discussed she can do PT or core strengthening exercises after she's cleared from her post partum appointment.   3. Post-op pain  -No leukocytosis or fever. Abdomen soft, although tender throughout.  -Reviewed CT results with Dr. Macon Large. Patient stable for discharge. Recommends 1 week supple of gabapentin. Rx gabapentin 200 BID x 7 days.      Judeth Horn 10/18/2023, 2:31 PM

## 2023-10-18 NOTE — MAU Note (Signed)
First bottle of oral contrast complete at 1614.

## 2023-10-18 NOTE — MAU Note (Addendum)
.  Melanie Strong is a 38 y.o. at Unknown here in MAU reporting: Two weeks one day post partum via C/S. She reports she is concerned about her incision opening as well as ongoing pain on the "inside" of her abdomen. She reports this has been ongoing since her C/S on 1/31. She reports her pains were worse last week when seen in MAU and reports she has been taking Tylenol around the clock. She reports she last took Tylenol one hour ago and this has relieved her pain. She reports after a few hours her pain always returns and it is almost unbearable. Reports regular bowel movements now. Last BM last night. Was just started on Norvasc on 2/12. Has not had her dose today. Breastfeeding and using formula. Hx Severe Pre-E.  Seen in MAU on 2/8 for abdominal pain with a distended abdomen noted according to note in epic. Patient was given one dose of Dilaudid and did not desire to wait for her CT of the abdomen and ultimately left AMA.  Seen in office on 2/12 for f/u wound check. Patient was given Toradol IM and sent home with a rx for Flexeril.  Onset of complaint: 1/31 Pain score: Denies current pain.  Vitals:   10/18/23 1401  BP: (!) 142/79  Pulse: 63  Resp: 16  Temp: 98.2 F (36.8 C)  SpO2: 100%     Lab orders placed from triage: none

## 2023-10-20 ENCOUNTER — Encounter: Payer: Medicaid Other | Admitting: Advanced Practice Midwife

## 2023-11-01 DIAGNOSIS — Z419 Encounter for procedure for purposes other than remedying health state, unspecified: Secondary | ICD-10-CM | POA: Diagnosis not present

## 2023-11-14 ENCOUNTER — Other Ambulatory Visit: Payer: Medicaid Other

## 2023-11-14 ENCOUNTER — Ambulatory Visit: Payer: Medicaid Other | Admitting: Obstetrics & Gynecology

## 2023-11-17 ENCOUNTER — Ambulatory Visit: Payer: Medicaid Other | Admitting: Advanced Practice Midwife

## 2023-11-17 ENCOUNTER — Other Ambulatory Visit: Payer: Medicaid Other

## 2023-11-17 VITALS — BP 123/86 | HR 75 | Ht 65.0 in | Wt 171.0 lb

## 2023-11-17 DIAGNOSIS — O24439 Gestational diabetes mellitus in the puerperium, unspecified control: Secondary | ICD-10-CM

## 2023-11-17 DIAGNOSIS — Z9189 Other specified personal risk factors, not elsewhere classified: Secondary | ICD-10-CM

## 2023-11-17 DIAGNOSIS — R1030 Lower abdominal pain, unspecified: Secondary | ICD-10-CM

## 2023-11-17 DIAGNOSIS — G8918 Other acute postprocedural pain: Secondary | ICD-10-CM | POA: Diagnosis not present

## 2023-11-17 DIAGNOSIS — M6208 Separation of muscle (nontraumatic), other site: Secondary | ICD-10-CM

## 2023-11-17 DIAGNOSIS — Z3009 Encounter for other general counseling and advice on contraception: Secondary | ICD-10-CM

## 2023-11-17 DIAGNOSIS — O165 Unspecified maternal hypertension, complicating the puerperium: Secondary | ICD-10-CM | POA: Diagnosis not present

## 2023-11-17 MED ORDER — ACE ABDOMEN SURGICAL BINDER MISC: 1.0000 | Freq: Once | 0 refills | Status: AC

## 2023-11-17 NOTE — Progress Notes (Signed)
 Post Partum Visit Note  Melanie Strong is a 38 y.o. [phone] female who presents for a postpartum visit. She is 6 weeks postpartum following a repeat cesarean section.  I have fully reviewed the prenatal and intrapartum course. The delivery was at 37 gestational weeks.  Anesthesia: spinal. Postpartum course has been good. Baby is doing well. Baby is feeding by breast and Bottle- Emfamil Total care. Bleeding staining only. Bowel function is normal. Bladder function is normal. Patient is not sexually active. Contraception method is none. Postpartum depression screening: positive.   The pregnancy intention screening data noted above was reviewed. Potential methods of contraception were discussed. The patient elected to proceed with natural family planning.    Health Maintenance Due  Topic Date Due   Pneumococcal Vaccine 57-21 Years old (1 of 2 - PCV) Never done   DTaP/Tdap/Td (1 - Tdap) Never done   INFLUENZA VACCINE  Never done   COVID-19 Vaccine (1 - 2024-25 season) Never done    The following portions of the patient's history were reviewed and updated as appropriate: allergies, current medications, past family history, past medical history, past social history, past surgical history, and problem list.  Review of Systems   Objective:  LMP 01/11/2023    General:  alert and cooperative   Breasts:  not indicated  Lungs: clear to auscultation bilaterally  Heart:  regular rate and rhythm, S1, S2 normal, no murmur, click, rub or gallop and normal apical impulse  Abdomen: soft, non-tender; bowel sounds normal; no masses,  no organomegaly   Wound well approximated incision  GU exam:  not indicated       Assessment:    1. Gestational diabetes mellitus (GDM), postpartum (Primary)  - Glucose Tolerance, 2 Hours w/1 Hour  2. Postpartum care following cesarean delivery Assessed incision, it is clean dry, and well approximated. Patient denies any pain or concerns with her incision.      3. Diastasis recti -counseled patient on use of belly binder, prescription sent to pharmacy.  -referral for PT sent -refilled flexeril prescription per patient request -Patient will need to return in 6 weeks to assess if her symptoms are improving/resolved  4. At risk for postpartum depression Patient was counseled and offered talk therapy as well as options for medications. Patient declined   5. Postpartum hypertension Patient reports she does not like taking her amlodipine due to taste in addition blood pressures are still high usually around 150's/100's.  Counseled patient that we will be changing her blood pressure medication to procardia and that we will need to see her back in 6 weeks for a blood pressure check.   6. Encounter for other general counseling and advice on contraception --Discussed pt contraceptive plans and reviewed contraceptive methods based on pt preferences and effectiveness.  Pt prefers  Plan:   Essential components of care per ACOG recommendations:  1.  Mood and well being: Patient with positive depression screening today. Reviewed local resources for support. Patient offered counseling and medication. Patient declined. - Patient tobacco use?   - hx of drug use? No.    2. Infant care and feeding:  -Patient currently breastmilk feeding?   -Social determinants of health (SDOH) reviewed in EPIC. No concerns.  3. Sexuality, contraception and birth spacing - Patient does not want a pregnancy in the next year.  Desired family size is 3 children.  - Reviewed reproductive life planning. Reviewed contraceptive methods based on pt preferences and effectiveness.  Patient desired No Method -  No Contraceptive Precautions today.   - Discussed birth spacing of 18 months  4. Sleep and fatigue -Encouraged family/partner/community support of 4 hrs of uninterrupted sleep to help with mood and fatigue  5. Physical Recovery  - Discussed patients delivery and  complications. She describes her labor as good. - Patient had a C-section.  Perineal healing reviewed. Patient expressed understanding - Patient has urinary incontinence? No. - Patient is safe to resume physical and sexual activity  6.  Health Maintenance - HM due items addressed Yes - Last pap smear  Diagnosis  Date Value Ref Range Status  02/01/2021   Final   - Negative for intraepithelial lesion or malignancy (NILM)   Pap smear not done at today's visit.  -Breast Cancer screening indicated? No.   7. Chronic Disease/Pregnancy Condition follow up: Hypertension and Gestational Diabetes  - PCP follow up  Zenia Resides, FNP-[facility] [employer], [facility]   CNM attestation:  I have seen and examined this patient; I agree with above documentation in the NP student's note.   Melanie Strong is a 38 y.o. [phone] in the [facility] office for routine/problem gyn visit.  See problem list below.   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 Gestational diabetes mellitus (GDM),  postpartum (Primary)  - Glucose Tolerance, 2 Hours w/1 Hour  2. Postpartum care following cesarean delivery  - CBC  3. Diastasis recti --Pt with 3-3.5 cm diastasis recti with associated abdominal pain.  Pain to palpation of diastasis area  and from umbilicus down to pubic symphysis --See recent workup and imaging in MAU  - Ambulatory referral to Physical Therapy - Elastic Bandages & Supports (ACE ABDOMEN SURGICAL BINDER) MISC; 1 Device by Does not apply route once for 1 dose.  Dispense: 1 each; Refill: 0  4. At risk for postpartum depression --Pt declines counseling or medications but reports some PPD with previous pregnancy  5. Postpartum hypertension --Pt BP elevated, taking Norvasc but does not like side effects --Change to Procardia, f/u in 6 weeks  6. Encounter for other general counseling and advice on contraception --Pt declines contraception today  7. Postoperative lower abdominal pain --PT referral  - Elastic Bandages & Supports (ACE ABDOMEN SURGICAL BINDER) MISC; 1 Device by Does not apply route once for 1 dose.  Dispense: 1 each; Refill: 0      Sharen Counter, CNM 7:10 PM

## 2023-11-18 ENCOUNTER — Telehealth: Payer: Self-pay | Admitting: *Deleted

## 2023-11-18 ENCOUNTER — Encounter: Payer: Self-pay | Admitting: Advanced Practice Midwife

## 2023-11-18 LAB — CBC
Hematocrit: 38.9 % (ref 34.0–46.6)
Hemoglobin: 12.6 g/dL (ref 11.1–15.9)
MCH: 26.9 pg (ref 26.6–33.0)
MCHC: 32.4 g/dL (ref 31.5–35.7)
MCV: 83 fL (ref 79–97)
Platelets: 273 x10E3/uL (ref 150–450)
RBC: 4.68 x10E6/uL (ref 3.77–5.28)
RDW: 14.2 % (ref 11.7–15.4)
WBC: 5 x10E3/uL (ref 3.4–10.8)

## 2023-11-18 LAB — GLUCOSE TOLERANCE, 2 HOURS W/ 1HR
Glucose, 1 hour: 181 mg/dL — ABNORMAL HIGH (ref 70–179)
Glucose, 2 hour: 115 mg/dL (ref 70–152)
Glucose, Fasting: 88 mg/dL (ref 70–91)

## 2023-11-18 NOTE — Telephone Encounter (Signed)
 TC to follow up on [facility] message from pt regarding PP GTT results. Sharen Counter, CNM was consulted and reviewed results. Pt advised of failed PP GTT and thus DX of T2DM. Pt was assisted with finding a primary care provider using the [email or web address] site. Pt was successful in finding a list of primary care providers and is comfortable picking from the list and calling to check on appt availability in the morning. Importance of follow up for Hgb A1C monitoring and interventions as needed stressed. Pt advised that close monitoring and follow up will decrease the risk of complications from the disease. Pt verbalized understanding.

## 2023-11-19 ENCOUNTER — Encounter: Payer: Self-pay | Admitting: Advanced Practice Midwife

## 2023-11-21 ENCOUNTER — Other Ambulatory Visit: Payer: Self-pay | Admitting: Advanced Practice Midwife

## 2023-11-21 MED ORDER — IBUPROFEN 600 MG PO TABS: 600.0000 mg | ORAL_TABLET | Freq: Four times a day (QID) | ORAL | 1 refills | Status: DC | PRN

## 2023-11-21 MED ORDER — NIFEDIPINE ER OSMOTIC RELEASE 30 MG PO TB24: 30.0000 mg | ORAL_TABLET | Freq: Every day | ORAL | 2 refills | Status: DC

## 2023-11-24 ENCOUNTER — Encounter: Payer: Self-pay | Admitting: Student in an Organized Health Care Education/Training Program

## 2023-11-24 ENCOUNTER — Ambulatory Visit: Admitting: Student in an Organized Health Care Education/Training Program

## 2023-11-24 VITALS — BP 110/68 | HR 86 | Temp 98.4°F | Ht 65.0 in | Wt 170.5 lb

## 2023-11-24 DIAGNOSIS — M6208 Separation of muscle (nontraumatic), other site: Secondary | ICD-10-CM | POA: Insufficient documentation

## 2023-11-24 DIAGNOSIS — R7303 Prediabetes: Secondary | ICD-10-CM | POA: Diagnosis not present

## 2023-11-24 DIAGNOSIS — I10 Essential (primary) hypertension: Secondary | ICD-10-CM | POA: Insufficient documentation

## 2023-11-24 DIAGNOSIS — G629 Polyneuropathy, unspecified: Secondary | ICD-10-CM | POA: Insufficient documentation

## 2023-11-24 DIAGNOSIS — I152 Hypertension secondary to endocrine disorders: Secondary | ICD-10-CM

## 2023-11-24 DIAGNOSIS — G609 Hereditary and idiopathic neuropathy, unspecified: Secondary | ICD-10-CM

## 2023-11-24 LAB — LIPID PANEL
Cholesterol: 240 mg/dL — ABNORMAL HIGH (ref 0–200)
HDL: 52.9 mg/dL (ref >39.00)
LDL Cholesterol: 126 mg/dL — ABNORMAL HIGH (ref 0–99)
NonHDL: 186.76
Total CHOL/HDL Ratio: 5
Triglycerides: 302 mg/dL — ABNORMAL HIGH (ref 0.0–149.0)
VLDL: 60.4 mg/dL — ABNORMAL HIGH (ref 0.0–40.0)

## 2023-11-24 LAB — POCT GLYCOSYLATED HEMOGLOBIN (HGB A1C): Hemoglobin A1C: 5 % (ref 4.0–5.6)

## 2023-11-24 LAB — VITAMIN B12: Vitamin B-12: 382 pg/mL (ref 211–911)

## 2023-11-24 LAB — TSH: TSH: 0.96 u[IU]/mL (ref 0.35–5.50)

## 2023-11-24 MED ORDER — NIFEDIPINE ER OSMOTIC RELEASE 30 MG PO TB24
30.0000 mg | ORAL_TABLET | Freq: Every day | ORAL | Status: AC
Start: 2023-11-24 — End: ?

## 2023-11-24 NOTE — Assessment & Plan Note (Signed)
 Recent problem complicating her pregnancy earlier this year.  She was treated supportively with gabapentin which helped only a little.  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  Etiology could be related to newly diagnosed diabetes.  Will also check TSH given the mild thyroid enlargement on exam.  Check B12 and immunofixation.  She has a consultation planned with neurology later this week to consider nerve conduction studies.

## 2023-11-24 NOTE — Assessment & Plan Note (Signed)
 Recently diagnosed problem.  Blood pressure well-controlled today at 110/68.  Tolerating nifedipine well.  Will plan to continue nifedipine 30 mg daily.  We talked about other medications like an ARB, but patient is still considering becoming pregnant in the future.  I think if she decides not to become pregnant and would use a contraceptive, we will have more options of antihypertensive medications to use in the future.

## 2023-11-24 NOTE — Progress Notes (Signed)
 New Patient Office Visit  Subjective    Patient ID: Melanie Strong, female    DOB: 09-13-1985  Age: 39 y.o. MRN: [id number]  CC:  Chief Complaint  Patient presents with   Establish Care    Pt is here today to Est.Care Pt reports she was dx Diabetes by OB-GYN  Pt reports she has not had PCP in yrs    HPI Melanie Strong presents to establish care  38 year old person here today for evaluation of possible diabetes.  She was recently pregnant, delivered via a complicated C-section in January.  She had gestational diabetes, and in her follow-up visit she was told that her glucose tolerance test was abnormal even after the pregnancy ended.  She also had gestational hypertension, this was treated at a point with amlodipine but she did not tolerate that.  She recently switched to nifedipine and is tolerating that much better.  She lives at home with her 3 children, age 32, 1, and newborn.  She has some support from the children's father.  She works remotely from home for [employer].  Denies any issues with alcohol or tobacco.  Reports things are going well at home, good mood.  Does not exercise much.  Having some trouble with postpartum weight this time.  No recent illness, no fevers or chills, no chest pain, no dyspnea, nothing physical is limiting her from exercising.    Outpatient Encounter Medications as of 11/24/2023  Medication Sig   acetaminophen (TYLENOL) 500 MG tablet Take 2 tablets (1,000 mg total) by mouth every 6 (six) hours.   bisacodyl (DULCOLAX) 10 MG suppository Place 1 suppository (10 mg total) rectally as needed for moderate constipation.   cyclobenzaprine (FLEXERIL) 10 MG tablet Take 1 tablet (10 mg total) by mouth 3 (three) times daily as needed (headache).   ibuprofen (ADVIL) 600 MG tablet Take 1 tablet (600 mg total) by mouth every 6 (six) hours as needed.   Prenatal Vit-Fe Fumarate-FA (MULTIVITAMIN-PRENATAL) 27-0.8 MG TABS tablet Take 1 tablet by mouth daily  before breakfast.   senna-docusate (SENOKOT-S) 8.6-50 MG tablet Take 2 tablets by mouth 2 (two) times daily as needed for mild constipation.   [DISCONTINUED] NIFEdipine (PROCARDIA XL) 30 MG 24 hr tablet Take 1 tablet (30 mg total) by mouth daily.   NIFEdipine (PROCARDIA XL) 30 MG 24 hr tablet Take 1 tablet (30 mg total) by mouth daily.   [DISCONTINUED] gabapentin (NEURONTIN) 100 MG capsule Take 2 capsules (200 mg total) by mouth at bedtime. (Patient not taking: Reported on 11/24/2023)   [DISCONTINUED] gabapentin (NEURONTIN) 100 MG capsule Take 2 capsules (200 mg total) by mouth 2 (two) times daily for 7 days.   [DISCONTINUED] oxycodone (OXY-IR) 5 MG capsule Take 1 capsule (5 mg total) by mouth every 4 (four) hours as needed (breakthrough pain). (Patient not taking: Reported on 11/24/2023)   [DISCONTINUED] oxyCODONE (ROXICODONE) 5 MG immediate release tablet Take 1 tablet (5 mg total) by mouth every 4 (four) hours as needed for severe pain (pain score 7-10). (Patient not taking: Reported on 11/24/2023)   No facility-administered encounter medications on file as of 11/24/2023.    Past Medical History:  Diagnosis Date   Anemia    Diabetes mellitus without complication (HCC)    Genital herpes 07/11/2021   Gestational diabetes    History of cesarean section 06/19/2022   LTCS 12/22 due to NRFHT and SPEC     History of ELISA positive for HSV 06/19/2022   History of ileus  Hx of preeclampsia, prior pregnancy, currently pregnant 03/20/2022   Pregnancy induced hypertension     Past Surgical History:  Procedure Laterality Date   CESAREAN SECTION  08/23/2021   Procedure: CESAREAN SECTION;  Surgeon: [name], MD;  Location: [facility] LD ORS;  Service: Obstetrics;;   CESAREAN SECTION N/A 10/04/2022   Procedure: CESAREAN SECTION;  Surgeon: Adam Phenix, MD;  Location: [facility] LD ORS;  Service: Obstetrics;  Laterality: N/A;   CESAREAN SECTION N/A 10/03/2023   Procedure: CESAREAN SECTION;  Surgeon:  Lesly Dukes, MD;  Location: [facility] LD ORS;  Service: Obstetrics;  Laterality: N/A;    Family History  Problem Relation Age of Onset   Hypertension Mother    Diabetes Mother        Boarderline   Lupus Mother        2nd opinion says no/ unsure   Cancer Father        rectal   Sickle cell anemia Sister    Healthy Brother    Healthy Brother    Cancer Paternal Aunt        brain   Cancer Paternal Uncle        stomach   Diabetes Maternal Grandmother    Other Neg Hx    Asthma Neg Hx    Heart disease Neg Hx    Stroke Neg Hx     Social History   Socioeconomic History   Marital status: Married    Spouse name: Not on file   Number of children: Not on file   Years of education: Not on file   Highest education level: Not on file  Occupational History   Not on file  Tobacco Use   Smoking status: Some Days    Current packs/day: 0.25    Average packs/day: 0.3 packs/day for 10.0 years (2.5 ttl pk-yrs)    Types: Cigarettes    Passive exposure: Never   Smokeless tobacco: Never  Vaping Use   Vaping status: Every Day  Substance and Sexual Activity   Alcohol use: No    Comment: rare   Drug use: No   Sexual activity: Yes    Partners: Male    Birth control/protection: None  Other Topics Concern   Not on file  Social History Narrative   Not on file   Social Drivers of Health   Financial Resource Strain: Not on file  Food Insecurity: No Food Insecurity (10/11/2023)   Hunger Vital Sign    Worried About Running Out of Food in the Last Year: Never true    Ran Out of Food in the Last Year: Never true  Transportation Needs: No Transportation Needs (10/11/2023)   PRAPARE - Administrator, Civil Service (Medical): No    Lack of Transportation (Non-Medical): No  Physical Activity: Not on file  Stress: Not on file  Social Connections: Unknown (10/11/2023)   Social Connection and Isolation Panel [NHANES]    Frequency of Communication with Friends and Family: Three times a week     Frequency of Social Gatherings with Friends and Family: More than three times a week    Attends Religious Services: Patient declined    Active Member of Clubs or Organizations: Patient declined    Attends Banker Meetings: Patient declined    Marital Status: Not on file  Intimate Partner Violence: Not At Risk (10/11/2023)   Humiliation, Afraid, Rape, and Kick questionnaire    Fear of Current or Ex-Partner: No  Emotionally Abused: No    Physically Abused: No    Sexually Abused: No          Objective    BP 110/68   Pulse 86   Temp 98.4 F (36.9 C)   Ht 5\' 5"  (1.651 m)   Wt 170 lb 8 oz (77.3 kg)   SpO2 98%   BMI 28.37 kg/m   Physical Exam  Gen: Well-appearing Neck: Mildly enlarged thyroid, normal right lobe diffusely, no discrete nodule appreciated Heart: Regular, no murmur Lungs: Unlabored, clear throughout Abd: Well-healed midline incision, moderate diasis rectae present Ext: Warm and well-perfused, no lower extremity edema, normal joints Psych: Appropriate mood and affect, not depressed or anxious appearing Neuro: Alert, conversational, full strength upper and lower extremities, normal gait, normal sensation throughout     Assessment & Plan:   Problem List Items Addressed This Visit       High   Prediabetes - Primary (Chronic)   Patient is at increased risk for developing diabetes.  1 hour glucose of 181 during the glucose tolerance test.  Hemoglobin A1c today is normal at 5.0%.  We talked about her risk for developing diabetes in the future that would require medication management.  I do not think we need medication management right now.  Will plan to recheck the A1c in 6 months.  We talked about the importance of weight loss, diet, and exercise.      Relevant Orders   POCT glycosylated hemoglobin (Hb A1C) (Completed)   Lipid panel   Hypertension associated with diabetes (HCC) (Chronic)   Recently diagnosed problem.  Blood pressure  well-controlled today at 110/68.  Tolerating nifedipine well.  Will plan to continue nifedipine 30 mg daily.  We talked about other medications like an ARB, but patient is still considering becoming pregnant in the future.  I think if she decides not to become pregnant and would use a contraceptive, we will have more options of antihypertensive medications to use in the future.      Relevant Medications   NIFEdipine (PROCARDIA XL) 30 MG 24 hr tablet     Medium    Peripheral neuropathy   Recent problem complicating her pregnancy earlier this year.  She was treated supportively with gabapentin which helped only a little.  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  Etiology could be related to newly diagnosed diabetes.  Will also check TSH given the mild thyroid enlargement on exam.  Check B12 and immunofixation.  She has a consultation planned with neurology later this week to consider nerve conduction studies.      Relevant Orders   TSH   Vitamin B12   IFE, PE and FLC, Serum    Return in about 6 months (around 05/26/2024).   Tyson Alias, MD

## 2023-11-24 NOTE — Assessment & Plan Note (Signed)
 Patient is at increased risk for developing diabetes.  1 hour glucose of 181 during the glucose tolerance test.  Hemoglobin A1c today is normal at 5.0%.  We talked about her risk for developing diabetes in the future that would require medication management.  I do not think we need medication management right now.  Will plan to recheck the A1c in 6 months.  We talked about the importance of weight loss, diet, and exercise.

## 2023-11-25 ENCOUNTER — Ambulatory Visit: Admitting: Advanced Practice Midwife

## 2023-11-25 NOTE — Progress Notes (Unsigned)
 [facility]  PATIENT: Melanie Strong DOB: May 30, 1986 REFERRING DOCTOR OR PCP:  Sharen Counter, CNM, Erlinda Hong, MD SOURCE: Patient, notes from primary care and recent hospitalization, imaging and lab reports  _________________________________   HISTORICAL  CHIEF COMPLAINT:  Chief Complaint  Patient presents with   New Patient (Initial Visit)    Pt in room 11. New patient internal referral for bilateral  burning sensation in feet, pt reports burning/tingling sensation. Pt reports this started when she was pregnancy, had baby in Jan.      HISTORY OF PRESENT ILLNESS:  I had the pleasure of seeing your patient, Melanie Strong, at [facility] for neurologic consultation regarding her dysesthesias.  She is a 38 yo woman who developed a burning sensation in both feet that started October 2024 while 6 months pregnant.  The intensity was mostly stable during the rest of her pregnancy.  She feels the intensity is a little better than earlier this year.  She described it as a burning sensation.  The right was a little bit worse than the left.  She denies actual numbness.  No weakness or change in gait.   She has some urinary urgency/frequency.     She has had back pain since delivery in January 2025 (C-section).    This pain is predominantly in the lower lumbar region with some radiation into the hips and upper legs  She has had some labwork last week.   SPEP/IFE/FLC showed mild increased Ig Kappa free light chain and Kappa/lambda ratio.  IgG, IgM were normal.  No M spike.     HgbA1c was 5.0 normal.   B12 was normal.  Her mother has SLE.   Tyaira denies rash, arthritic pain, dry mouth/eyes, skin changes.       She had a repeat C-section 10/03/2023.  She delivered a full-term female with Apgars 8, 8 and weight of 3140 g.  She had spinal anesthesia.  She had pre-diabetes.  However, recent hemoglobin A1c was normal  REVIEW OF  SYSTEMS: Constitutional: No fevers, chills, sweats, or change in appetite Eyes: No visual changes, double vision, eye pain Ear, nose and throat: No hearing loss, ear pain, nasal congestion, sore throat Cardiovascular: No chest pain, palpitations Respiratory:  No shortness of breath at rest or with exertion.   No wheezes GastrointestinaI: No nausea, vomiting, diarrhea, abdominal pain, fecal incontinence Genitourinary:  No dysuria, urinary retention or frequency.  No nocturia. Musculoskeletal:  No neck pain, back pain Integumentary: No rash, pruritus, skin lesions Neurological: as above Psychiatric: No depression at this time.  No anxiety Endocrine: No palpitations, diaphoresis, change in appetite, change in weigh or increased thirst Hematologic/Lymphatic:  No anemia, purpura, petechiae. Allergic/Immunologic: No itchy/runny eyes, nasal congestion, recent allergic reactions, rashes  ALLERGIES: No Known Allergies  HOME MEDICATIONS:  Current Outpatient Medications:    acetaminophen (TYLENOL) 500 MG tablet, Take 2 tablets (1,000 mg total) by mouth every 6 (six) hours., Disp: 30 tablet, Rfl: 0   cyclobenzaprine (FLEXERIL) 10 MG tablet, Take 1 tablet (10 mg total) by mouth 3 (three) times daily as needed (headache)., Disp: 30 tablet, Rfl: 0   ibuprofen (ADVIL) 600 MG tablet, Take 1 tablet (600 mg total) by mouth every 6 (six) hours as needed., Disp: 30 tablet, Rfl: 1   NIFEdipine (PROCARDIA XL) 30 MG 24 hr tablet, Take 1 tablet (30 mg total) by mouth daily., Disp: , Rfl:    Prenatal Vit-Fe Fumarate-FA (MULTIVITAMIN-PRENATAL) 27-0.8 MG TABS tablet, Take 1 tablet  by mouth daily before breakfast., Disp: 30 tablet, Rfl: 12   bisacodyl (DULCOLAX) 10 MG suppository, Place 1 suppository (10 mg total) rectally as needed for moderate constipation. (Patient not taking: Reported on 11/26/2023), Disp: 12 suppository, Rfl: 0   senna-docusate (SENOKOT-S) 8.6-50 MG tablet, Take 2 tablets by mouth 2 (two) times  daily as needed for mild constipation. (Patient not taking: Reported on 11/26/2023), Disp: 60 tablet, Rfl: 0  PAST MEDICAL HISTORY: Past Medical History:  Diagnosis Date   Anemia    Diabetes mellitus without complication (HCC)    Genital herpes 07/11/2021   Gestational diabetes    History of cesarean section 06/19/2022   LTCS 12/22 due to NRFHT and SPEC     History of ELISA positive for HSV 06/19/2022   History of ileus    Hx of preeclampsia, prior pregnancy, currently pregnant 03/20/2022   Pregnancy induced hypertension     PAST SURGICAL HISTORY: Past Surgical History:  Procedure Laterality Date   CESAREAN SECTION  08/23/2021   Procedure: CESAREAN SECTION;  Surgeon: [name], MD;  Location: [facility] LD ORS;  Service: Obstetrics;;   CESAREAN SECTION N/A 10/04/2022   Procedure: CESAREAN SECTION;  Surgeon: Adam Phenix, MD;  Location: [facility] LD ORS;  Service: Obstetrics;  Laterality: N/A;   CESAREAN SECTION N/A 10/03/2023   Procedure: CESAREAN SECTION;  Surgeon: Lesly Dukes, MD;  Location: [facility] LD ORS;  Service: Obstetrics;  Laterality: N/A;    FAMILY HISTORY: Family History  Problem Relation Age of Onset   Hypertension Mother    Diabetes Mother        Boarderline   Lupus Mother        2nd opinion says no/ unsure   Cancer Father        rectal   Sickle cell anemia Sister    Healthy Brother    Healthy Brother    Cancer Paternal Aunt        brain   Cancer Paternal Uncle        stomach   Diabetes Maternal Grandmother    Other Neg Hx    Asthma Neg Hx    Heart disease Neg Hx    Stroke Neg Hx     SOCIAL HISTORY: Social History   Socioeconomic History   Marital status: Married    Spouse name: Not on file   Number of children: Not on file   Years of education: Not on file   Highest education level: Not on file  Occupational History   Not on file  Tobacco Use   Smoking status: Some Days    Current packs/day: 0.25    Average packs/day: 0.3 packs/day for  10.0 years (2.5 ttl pk-yrs)    Types: Cigarettes    Passive exposure: Never   Smokeless tobacco: Never  Vaping Use   Vaping status: Never Used  Substance and Sexual Activity   Alcohol use: No    Comment: rare   Drug use: No   Sexual activity: Yes    Partners: Male    Birth control/protection: None  Other Topics Concern   Not on file  Social History Narrative   Not on file   Social Drivers of Health   Financial Resource Strain: Not on file  Food Insecurity: No Food Insecurity (10/11/2023)   Hunger Vital Sign    Worried About Running Out of Food in the Last Year: Never true    Ran Out of Food in the Last Year: Never true  Transportation  Needs: No Transportation Needs (10/11/2023)   PRAPARE - Administrator, Civil Service (Medical): No    Lack of Transportation (Non-Medical): No  Physical Activity: Not on file  Stress: Not on file  Social Connections: Unknown (10/11/2023)   Social Connection and Isolation Panel [NHANES]    Frequency of Communication with Friends and Family: Three times a week    Frequency of Social Gatherings with Friends and Family: More than three times a week    Attends Religious Services: Patient declined    Active Member of Clubs or Organizations: Patient declined    Attends Banker Meetings: Patient declined    Marital Status: Not on file  Intimate Partner Violence: Not At Risk (10/11/2023)   Humiliation, Afraid, Rape, and Kick questionnaire    Fear of Current or Ex-Partner: No    Emotionally Abused: No    Physically Abused: No    Sexually Abused: No       PHYSICAL EXAM  Vitals:   11/26/23 1032  BP: 123/79  Pulse: 80  Weight: 169 lb (76.7 kg)  Height: 5\' 5"  (1.651 m)    Body mass index is 28.12 kg/m.   General: The patient is well-developed and well-nourished and in no acute distress  HEENT:  Head is [place]/AT.  Sclera are anicteric.    Neck: No carotid bruits are noted.  The neck is nontender.  Cardiovascular: The  heart has a regular rate and rhythm with a normal S1 and S2. There were no murmurs, gallops or rubs.    Skin: Extremities are without rash or  edema.  Musculoskeletal:  Back is nontender  Neurologic Exam  Mental status: The patient is alert and oriented x 3 at the time of the examination. The patient has apparent normal recent and remote memory, with an apparently normal attention span and concentration ability.   Speech is normal.  Cranial nerves: Extraocular movements are full. Pupils are equal, round, and reactive to light and accomodation.  Visual fields are full.  Facial symmetry is present. There is good facial sensation to soft touch bilaterally.Facial strength is normal.  Trapezius and sternocleidomastoid strength is normal. No dysarthria is noted.  The tongue is midline, and the patient has symmetric elevation of the soft palate. No obvious hearing deficits are noted.  Motor:  Muscle bulk is normal.   Tone is normal. Strength is  4++ in right gastroc (reduced ability to stand on toes) and 4/5 in right toe extensors and 5 / 5 in other leg and arm muscles  Sensory: Sensory testing is intact to pinprick, soft touch and vibration sensation in arms.  Mild reduced vibration in right toes and mild reduced pinprick in left toes.   On the right, vibration was 75% at ankle and 50% at toes.   Pinprick was reduced in S1 dermatome of foot more than L5.    Coordination: Cerebellar testing reveals good finger-nose-finger and heel-to-shin bilaterally.  Gait and station: Station is normal.   Gait is normal. Tandem gait is normal. Romberg is negative.   Reflexes: Deep tendon reflexes are symmetric and normal in the arms but trace at knees and absent in the ankles.   Plantar responses are flexor.    DIAGNOSTIC DATA (LABS, IMAGING, TESTING) - I reviewed patient records, labs, notes, testing and imaging myself where available.  Lab Results  Component Value Date   WBC 5.0 11/17/2023   HGB 12.6  11/17/2023   HCT 38.9 11/17/2023   MCV 83 11/17/2023  PLT 273 11/17/2023      Component Value Date/Time   NA 139 10/18/2023 1504   NA 139 09/08/2023 1430   K 4.9 10/18/2023 1504   CL 107 10/18/2023 1504   CO2 22 10/18/2023 1504   GLUCOSE 83 10/18/2023 1504   BUN 11 10/18/2023 1504   BUN 2 (L) 09/08/2023 1430   CREATININE 0.70 10/18/2023 1504   CALCIUM 8.6 (L) 10/18/2023 1504   PROT 7.5 11/24/2023 1034   ALBUMIN 3.0 (L) 10/18/2023 1504   ALBUMIN 4.0 09/08/2023 1430   AST 29 10/18/2023 1504   ALT 15 10/18/2023 1504   ALKPHOS 69 10/18/2023 1504   BILITOT 0.9 10/18/2023 1504   BILITOT 0.2 09/08/2023 1430   GFRNONAA >60 10/18/2023 1504   GFRAA >60 02/15/2019 1354   Lab Results  Component Value Date   CHOL 240 (H) 11/24/2023   HDL 52.90 11/24/2023   LDLCALC 126 (H) 11/24/2023   TRIG 302.0 (H) 11/24/2023   CHOLHDL 5 11/24/2023   Lab Results  Component Value Date   HGBA1C 5.0 11/24/2023   Lab Results  Component Value Date   VITAMINB12 382 11/24/2023   Lab Results  Component Value Date   TSH 0.96 11/24/2023       ASSESSMENT AND PLAN  Dysesthesia - Plan: MR LUMBAR SPINE WO CONTRAST, ANA+ENA+DNA/DS+Scl 70+SjoSSA/B  Lumbosacral radiculopathy at S1 - Plan: MR LUMBAR SPINE WO CONTRAST  Weakness of right foot - Plan: MR LUMBAR SPINE WO CONTRAST  Numbness  In summary, Ms. Roorda is a 38 year old woman who has had some dysesthetic sensations in her feet since late in her pregnancy and also reports back pain starting around the time of delivery.  Her exam shows mild sensory loss in the feet but this was worse on the right, especially the right S1 dermatome of the foot.  Additionally she had very mild weakness in some of the S1 innervated muscles.  Therefore, it is possible that a lumbar radiculopathy is contributing to her symptoms.  The more bilateral dysesthetic sensation could be a mild small fiber polyneuropathy.  Further evaluate these possibilities we will check a  lumbar spine MRI and also check an ENA panel for vasculitic issues.  She was placed on gabapentin and tolerated it but stopped after a few pills as she did not notice a benefit.  I discussed that the medicine needs to kick in for least 10 to 14 days and she will give gabapentin another try at 3 times a day.  I did not schedule follow-up but 1 may be arranged based on the results of the studies.  She is advised to call with if there is worsening of her symptoms.  If this occurs I would also consider checking NCV/EMG.  Thank you for asking me to see Ms. Manson Passey.  Please let me know if I can be of further assistance with her or other patients in the future.  Kostantinos Tallman A. Epimenio Foot, MD, [facility] 11/26/2023, 10:42 AM Certified in Neurology, Clinical Neurophysiology, Sleep Medicine and Neuroimaging  [facility] [address] [place], [place] [address] [phone]

## 2023-11-25 NOTE — Telephone Encounter (Signed)
 No problem.  I spoke with the patient by phone about the lipid results.

## 2023-11-26 ENCOUNTER — Encounter: Payer: Self-pay | Admitting: Neurology

## 2023-11-26 ENCOUNTER — Ambulatory Visit: Payer: Medicaid Other | Admitting: Neurology

## 2023-11-26 VITALS — BP 123/79 | HR 80 | Ht 65.0 in | Wt 169.0 lb

## 2023-11-26 DIAGNOSIS — R200 Anesthesia of skin: Secondary | ICD-10-CM | POA: Diagnosis not present

## 2023-11-26 DIAGNOSIS — R29898 Other symptoms and signs involving the musculoskeletal system: Secondary | ICD-10-CM

## 2023-11-26 DIAGNOSIS — M5417 Radiculopathy, lumbosacral region: Secondary | ICD-10-CM | POA: Diagnosis not present

## 2023-11-26 DIAGNOSIS — R208 Other disturbances of skin sensation: Secondary | ICD-10-CM

## 2023-11-26 LAB — IFE, PE AND FLC, SERUM
Albumin SerPl Elph-Mcnc: 4.3 g/dL (ref 2.9–4.4)
Albumin/Glob SerPl: 1.4 (ref 0.7–1.7)
Alpha 1: 0.2 g/dL (ref 0.0–0.4)
Alpha2 Glob SerPl Elph-Mcnc: 0.9 g/dL (ref 0.4–1.0)
B-Globulin SerPl Elph-Mcnc: 1 g/dL (ref 0.7–1.3)
Gamma Glob SerPl Elph-Mcnc: 1.2 g/dL (ref 0.4–1.8)
Globulin, Total: 3.2 g/dL (ref 2.2–3.9)
Ig Kappa Free Light Chain: 23.2 mg/L — ABNORMAL HIGH (ref 3.3–19.4)
Ig Lambda Free Light Chain: 13.6 mg/L (ref 5.7–26.3)
IgA/Immunoglobulin A, Serum: 256 mg/dL (ref 87–352)
IgG (Immunoglobin G), Serum: 1199 mg/dL (ref 586–1602)
IgM (Immunoglobulin M), Srm: 156 mg/dL (ref 26–217)
KAPPA/LAMBDA RATIO: 1.71 — ABNORMAL HIGH (ref 0.26–1.65)
Total Protein: 7.5 g/dL (ref 6.0–8.5)

## 2023-11-26 NOTE — Telephone Encounter (Signed)
 Noted.

## 2023-11-29 LAB — ANA+ENA+DNA/DS+SCL 70+SJOSSA/B
ANA Titer 1: NEGATIVE
ENA RNP Ab: <0.2 AI (ref 0.0–0.9)
ENA SM Ab Ser-aCnc: <0.2 AI (ref 0.0–0.9)
ENA SSA (RO) Ab: <0.2 AI (ref 0.0–0.9)
ENA SSB (LA) Ab: <0.2 AI (ref 0.0–0.9)
Scleroderma (Scl-70) (ENA) Antibody, IgG: <0.2 AI (ref 0.0–0.9)
dsDNA Ab: <1 [IU]/mL (ref 0–9)

## 2023-11-30 ENCOUNTER — Encounter: Payer: Self-pay | Admitting: Neurology

## 2023-12-13 DIAGNOSIS — Z419 Encounter for procedure for purposes other than remedying health state, unspecified: Secondary | ICD-10-CM | POA: Diagnosis not present

## 2023-12-15 ENCOUNTER — Telehealth: Payer: Self-pay | Admitting: Neurology

## 2023-12-15 NOTE — Telephone Encounter (Signed)
 Sim Dryer: [id number] exp. 12/01/23-01/30/24 sent to GI [phone]

## 2023-12-29 ENCOUNTER — Ambulatory Visit: Admitting: Advanced Practice Midwife

## 2023-12-29 ENCOUNTER — Encounter: Payer: Self-pay | Admitting: Advanced Practice Midwife

## 2023-12-29 VITALS — BP 133/80 | HR 82 | Wt 169.6 lb

## 2023-12-29 DIAGNOSIS — O165 Unspecified maternal hypertension, complicating the puerperium: Secondary | ICD-10-CM | POA: Diagnosis not present

## 2023-12-29 DIAGNOSIS — M6208 Separation of muscle (nontraumatic), other site: Secondary | ICD-10-CM | POA: Diagnosis not present

## 2023-12-29 DIAGNOSIS — R109 Unspecified abdominal pain: Secondary | ICD-10-CM

## 2023-12-29 DIAGNOSIS — G8918 Other acute postprocedural pain: Secondary | ICD-10-CM

## 2023-12-29 DIAGNOSIS — N946 Dysmenorrhea, unspecified: Secondary | ICD-10-CM | POA: Diagnosis not present

## 2023-12-29 NOTE — Progress Notes (Signed)
   GYNECOLOGY PROGRESS NOTE  History:  38 y.o. [id number] presents to [facility] office today for a follow up gyn visit.  She still has abdominal pain postpartum but it is much improved from her 6 week PP visit.  She has followed up with a new PCP for HTN and DM.  She reports periods are heavy and painful since delivery but she is not interested in hormones at this time.   She denies h/a, dizziness, shortness of breath, n/v, or fever/chills.    The following portions of the patient's history were reviewed and updated as appropriate: allergies, current medications, past family history, past medical history, past social history, past surgical history and problem list. Last pap smear on 02/01/21 was normal, neg HRHPV.  Health Maintenance Due  Topic Date Due   FOOT EXAM  Never done   OPHTHALMOLOGY EXAM  Never done   Diabetic kidney evaluation - Urine ACR  Never done   DTaP/Tdap/Td (1 - Tdap) Never done   Pneumococcal Vaccine 82-1 Years old (1 of 2 - PCV) Never done   COVID-19 Vaccine (1 - 2024-25 season) Never done     Review of Systems:  Pertinent items are noted in HPI.   Objective:  Physical Exam Blood pressure 133/80, pulse 82, weight 169 lb 9.6 oz (76.9 kg), last menstrual period 12/09/2023, currently breastfeeding. VS reviewed, nursing note reviewed,  Constitutional: well developed, well nourished, no distress HEENT: normocephalic CV: normal rate Pulm/chest wall: normal effort Breast Exam: deferred Abdomen: soft Neuro: alert and oriented x 3 Skin: warm, dry Psych: affect normal Pelvic exam: Cervix pink, visually closed, without lesion, scant white creamy discharge, vaginal walls and external genitalia normal Bimanual exam: Cervix 0/long/high, firm, anterior, neg CMT, uterus nontender, nonenlarged, adnexa without tenderness, enlargement, or mass  Assessment & Plan:  1. Postpartum hypertension (Primary) --Pt BP high normal today, and she is still taking Procardia  daily.  --Pt has  PCP follow up for BP and DM  2. Diastasis recti --Pain is improved but still present, pt to f/u with PT as prescribed  3. Postoperative abdominal pain --Improved  4. Dysmenorrhea --First 2 periods since periods returned were heavy and painful, c/w pt period before delivery.  Discussed tx options, pt to try natural methods, diet, exercise, NSAIDs for now.  Will make appt with provider as needed.   --F/U at annual in 1 year, consider Pap at that visit  Return if symptoms worsen or fail to improve.   Arlester Bence, CNM 9:50 AM

## 2023-12-29 NOTE — Progress Notes (Signed)
 Pt presents for f/u

## 2024-01-10 ENCOUNTER — Ambulatory Visit
Admission: RE | Admit: 2024-01-10 | Discharge: 2024-01-10 | Disposition: A | Discharge status: Home / Self Care | Source: Ambulatory Visit | Attending: Neurology | Admitting: Neurology

## 2024-01-10 ENCOUNTER — Encounter: Payer: Self-pay | Admitting: Neurology

## 2024-01-10 DIAGNOSIS — R208 Other disturbances of skin sensation: Secondary | ICD-10-CM | POA: Diagnosis not present

## 2024-01-10 DIAGNOSIS — M5417 Radiculopathy, lumbosacral region: Secondary | ICD-10-CM

## 2024-01-10 DIAGNOSIS — R29898 Other symptoms and signs involving the musculoskeletal system: Secondary | ICD-10-CM

## 2024-01-12 DIAGNOSIS — Z419 Encounter for procedure for purposes other than remedying health state, unspecified: Secondary | ICD-10-CM | POA: Diagnosis not present

## 2024-01-13 ENCOUNTER — Telehealth: Admitting: Physician Assistant

## 2024-01-13 DIAGNOSIS — G44209 Tension-type headache, unspecified, not intractable: Secondary | ICD-10-CM

## 2024-01-13 MED ORDER — CYCLOBENZAPRINE HCL 5 MG PO TABS: 5.0000 mg | ORAL_TABLET | Freq: Three times a day (TID) | ORAL | 1 refills | Status: DC | PRN

## 2024-01-13 MED ORDER — ONDANSETRON 4 MG PO TBDP: 4.0000 mg | ORAL_TABLET | Freq: Three times a day (TID) | ORAL | 0 refills | Status: DC | PRN

## 2024-01-13 NOTE — Patient Instructions (Signed)
 Arlyne Bering, thank you for joining Angelia Kelp, PA-C for today's virtual visit.  While this provider is not your primary care provider (PCP), if your PCP is located in our provider database this encounter information will be shared with them immediately following your visit.   A [facility] MyChart account gives you access to today's visit and all your visits, tests, and labs performed at [facility] " click here if you don't have a [facility] MyChart account or go to [email or web address]  Consent: (Patient) Arlyne Bering provided verbal consent for this virtual visit at the beginning of the encounter.  Current Medications:  Current Outpatient Medications:    cyclobenzaprine  (FLEXERIL ) 5 MG tablet, Take 1 tablet (5 mg total) by mouth 3 (three) times daily as needed for muscle spasms., Disp: 30 tablet, Rfl: 1   ondansetron  (ZOFRAN -ODT) 4 MG disintegrating tablet, Take 1 tablet (4 mg total) by mouth every 8 (eight) hours as needed., Disp: 20 tablet, Rfl: 0   acetaminophen  (TYLENOL ) 500 MG tablet, Take 2 tablets (1,000 mg total) by mouth every 6 (six) hours., Disp: 30 tablet, Rfl: 0   bisacodyl  (DULCOLAX) 10 MG suppository, Place 1 suppository (10 mg total) rectally as needed for moderate constipation. (Patient not taking: Reported on 12/29/2023), Disp: 12 suppository, Rfl: 0   ibuprofen  (ADVIL ) 600 MG tablet, Take 1 tablet (600 mg total) by mouth every 6 (six) hours as needed., Disp: 30 tablet, Rfl: 1   NIFEdipine  (PROCARDIA  XL) 30 MG 24 hr tablet, Take 1 tablet (30 mg total) by mouth daily., Disp: , Rfl:    Prenatal Vit-Fe Fumarate-FA (MULTIVITAMIN-PRENATAL) 27-0.8 MG TABS tablet, Take 1 tablet by mouth daily before breakfast., Disp: 30 tablet, Rfl: 12   senna-docusate (SENOKOT-S) 8.6-50 MG tablet, Take 2 tablets by mouth 2 (two) times daily as needed for mild constipation. (Patient not taking: Reported on 12/29/2023), Disp: 60 tablet, Rfl: 0   Medications ordered  in this encounter:  Meds ordered this encounter  Medications   cyclobenzaprine  (FLEXERIL ) 5 MG tablet    Sig: Take 1 tablet (5 mg total) by mouth 3 (three) times daily as needed for muscle spasms.    Dispense:  30 tablet    Refill:  1    Supervising Provider:   LAMPTEY, PHILIP O [[id number]]   ondansetron  (ZOFRAN -ODT) 4 MG disintegrating tablet    Sig: Take 1 tablet (4 mg total) by mouth every 8 (eight) hours as needed.    Dispense:  20 tablet    Refill:  0    Supervising Provider:   LAMPTEY, PHILIP O [[id number]]     *If you need refills on other medications prior to your next appointment, please contact your pharmacy*  Follow-Up: Call back or seek an in-person evaluation if the symptoms worsen or if the condition fails to improve as anticipated.  [facility] [phone]  Other Instructions  Tension Headache, Adult A tension headache is a feeling of pain, pressure, or aching over the front and sides of the head. The pain can be dull, or it can feel tight. There are two types of tension headache: Episodic tension headache. This is when the headaches happen fewer than 15 days a month. Chronic tension headache. This is when the headaches happen more than 15 days a month during a 27-month period. A tension headache can last from 30 minutes to several days. It is the most common kind of headache. Tension headaches are not normally associated with nausea or vomiting,  and they do not get worse with physical activity. What are the causes? The exact cause of this condition is not known. Tension headaches are often triggered by stress, anxiety, or depression. Other triggers may include: Alcohol. Too much caffeine  or caffeine  withdrawal. Respiratory infections, such as colds, flu, or sinus infections. Dental problems or teeth clenching. Fatigue. Holding your head and neck in the same position for a long period of time, such as while using a computer. Smoking. Arthritis of the  neck. What are the signs or symptoms? Symptoms of this condition include: A feeling of pressure or tightness around the head. Dull, aching head pain. Pain over the front and sides of the head. Tenderness in the muscles of the head, neck, and shoulders. How is this diagnosed? This condition may be diagnosed based on your symptoms, your medical history, and a physical exam. If your symptoms are severe or unusual, you may have imaging tests, such as a CT scan or an MRI of your head. Your vision may also be checked. How is this treated? This condition may be treated with lifestyle changes and with medicines that help relieve symptoms. Follow these instructions at home: Managing pain Take over-the-counter and prescription medicines only as told by your health care provider. When you have a headache, lie down in a dark, quiet room. If directed, put ice on your head and neck. To do this: Put ice in a plastic bag. Place a towel between your skin and the bag. Leave the ice on for 20 minutes, 2-3 times a day. Remove the ice if your skin turns bright red. This is very important. If you cannot feel pain, heat, or cold, you have a greater risk of damage to the area. If directed, apply heat to the back of your neck as often as told by your health care provider. Use the heat source that your health care provider recommends, such as a moist heat pack or a heating pad. Place a towel between your skin and the heat source. Leave the heat on for 20-30 minutes. Remove the heat if your skin turns bright red. This is especially important if you are unable to feel pain, heat, or cold. You have a greater risk of getting burned. Eating and drinking Eat meals on a regular schedule. If you drink alcohol: Limit how much you have to: 0-1 drink a day for women who are not pregnant. 0-2 drinks a day for men. Know how much alcohol is in your drink. In the [place] one drink equals one 12 oz bottle of beer (355 mL), one 5  oz glass of wine (148 mL), or one 1 oz glass of hard liquor (44 mL). Drink enough fluid to keep your urine pale yellow. Decrease your caffeine  intake, or stop using caffeine . Lifestyle Get 7-9 hours of sleep each night, or get the amount of sleep recommended by your health care provider. At bedtime, remove computers, phones, and tablets from your room. Find ways to manage your stress. This may include: Exercise. Deep breathing exercises. Yoga. Listening to music. Positive mental imagery. Try to sit up straight and avoid tensing your muscles. Do not use any products that contain nicotine or tobacco. These include cigarettes, chewing tobacco, and vaping devices, such as e-cigarettes. If you need help quitting, ask your health care provider. General instructions  Avoid any headache triggers. Keep a journal to help find out what may trigger your headaches. For example, write down: What you eat and drink. How much sleep you  get. Any change to your diet or medicines. Keep all follow-up visits. This is important. Contact a health care provider if: Your headache does not get better. Your headache comes back. You are sensitive to sounds, light, or smells because of a headache. You have nausea or you vomit. Your stomach hurts. Get help right away if: You suddenly develop a severe headache, along with any of the following: A stiff neck. Nausea and vomiting. Confusion. Weakness in one part or one side of your body. Double vision or loss of vision. Shortness of breath. Rash. Unusual sleepiness. Fever or chills. Trouble speaking. Pain in your eye or ear. Trouble walking or balancing. Feeling faint or passing out. Summary A tension headache is a feeling of pain, pressure, or aching over the front and sides of the head. A tension headache can last from 30 minutes to several days. It is the most common kind of headache. This condition may be diagnosed based on your symptoms, your medical  history, and a physical exam. This condition may be treated with lifestyle changes and with medicines that help relieve symptoms. This information is not intended to replace advice given to you by your health care provider. Make sure you discuss any questions you have with your health care provider. Document Revised: 05/16/2023 Document Reviewed: 05/16/2023 Elsevier Patient Education  2024 Elsevier Inc.   If you have been instructed to have an in-person evaluation today at a local Urgent Care facility, please use the link below. It will take you to a list of all of our available [facility], including address, phone number and hours of operation. Please do not delay care.  [facility]  If you or a family member do not have a primary care provider, use the link below to schedule a visit and establish care. When you choose a [facility] primary care physician or advanced practice provider, you gain a long-term partner in health. Find a Primary Care Provider  Learn more about [facility] in-office and virtual care options: [facility] - Get Care Now

## 2024-01-13 NOTE — Progress Notes (Signed)
 Virtual Visit Consent   Melanie Strong, you are scheduled for a virtual visit with a [facility] provider today. Just as with appointments in the office, your consent must be obtained to participate. Your consent will be active for this visit and any virtual visit you may have with one of our providers in the next 365 days. If you have a MyChart account, a copy of this consent can be sent to you electronically.  As this is a virtual visit, video technology does not allow for your provider to perform a traditional examination. This may limit your provider's ability to fully assess your condition. If your provider identifies any concerns that need to be evaluated in person or the need to arrange testing (such as labs, EKG, etc.), we will make arrangements to do so. Although advances in technology are sophisticated, we cannot ensure that it will always work on either your end or our end. If the connection with a video visit is poor, the visit may have to be switched to a telephone visit. With either a video or telephone visit, we are not always able to ensure that we have a secure connection.  By engaging in this virtual visit, you consent to the provision of healthcare and authorize for your insurance to be billed (if applicable) for the services provided during this visit. Depending on your insurance coverage, you may receive a charge related to this service.  I need to obtain your verbal consent now. Are you willing to proceed with your visit today? Melanie Strong has provided verbal consent on 01/13/2024 for a virtual visit (video or telephone). Angelia Kelp, PA-C  Date: 01/13/2024 5:57 PM   Virtual Visit via Video Note   I, Angelia Kelp, connected with  Melanie Strong  ([id number], May 01, 1986) on 01/13/24 at  5:15 PM EDT by a video-enabled telemedicine application and verified that I am speaking with the correct person using two identifiers.  Location: Patient: Virtual Visit  Location Patient: Home Provider: Virtual Visit Location Provider: Home Office   I discussed the limitations of evaluation and management by telemedicine and the availability of in person appointments. The patient expressed understanding and agreed to proceed.    History of Present Illness: Melanie Strong is a 38 y.o. who identifies as a female who was assigned female at birth, and is being seen today for headache.  HPI: Headache  This is a new problem. The current episode started in the past 7 days (3-4 days). The problem occurs constantly. The problem has been unchanged. The pain is located in the Frontal region. The pain does not radiate. The pain quality is not similar to prior headaches. The quality of the pain is described as squeezing. The pain is moderate. Associated symptoms include insomnia and nausea. Pertinent negatives include no back pain, blurred vision, dizziness, ear pain, eye pain, eye redness, eye watering, fever, loss of balance, muscle aches, numbness, phonophobia, photophobia, scalp tenderness, sinus pressure, sore throat, tingling, visual change, vomiting or weakness. Nothing aggravates the symptoms. She has tried NSAIDs (ibuprofen ) for the symptoms. Her past medical history is significant for hypertension (has been stable on Nifedipine ). There is no history of migraine headaches.     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    Allergies: No Known Allergies Medications:  Current Outpatient Medications:    cyclobenzaprine  (FLEXERIL ) 5 MG tablet, Take 1 tablet (5 mg total) by mouth 3 (three) times daily as needed for muscle spasms., Disp: 30 tablet, Rfl: 1   ondansetron   (ZOFRAN -ODT) 4 MG disintegrating tablet, Take 1 tablet (4 mg total) by mouth every 8 (eight) hours as needed., Disp: 20 tablet, Rfl: 0   acetaminophen  (TYLENOL ) 500 MG tablet, Take 2 tablets (1,000 mg total) by mouth every 6 (six) hours., Disp: 30 tablet, Rfl: 0   bisacodyl  (DULCOLAX) 10 MG suppository, Place 1 suppository (10 mg total) rectally as needed for moderate constipation. (Patient not taking: Reported on 12/29/2023), Disp: 12 suppository, Rfl: 0   ibuprofen  (ADVIL ) 600 MG tablet, Take 1 tablet (600 mg total) by mouth every 6 (six) hours as needed., Disp: 30 tablet, Rfl: 1   NIFEdipine  (PROCARDIA  XL) 30 MG 24 hr tablet, Take 1 tablet (30 mg total) by mouth daily., Disp: , Rfl:    Prenatal Vit-Fe Fumarate-FA (MULTIVITAMIN-PRENATAL) 27-0.8 MG TABS tablet, Take 1 tablet by mouth daily before breakfast., Disp: 30 tablet, Rfl: 12   senna-docusate (SENOKOT-S) 8.6-50 MG tablet, Take 2 tablets by mouth 2 (two) times daily as needed for mild constipation. (Patient not taking: Reported on 12/29/2023), Disp: 60 tablet, Rfl: 0  Observations/Objective: Patient is well-developed, well-nourished in no acute distress.  Resting comfortably at home.  Head is normocephalic, atraumatic.  No labored breathing.  Speech is clear and coherent with logical content.  Patient is alert and oriented at baseline.    Assessment and Plan: 1. Tension headache (Primary) - cyclobenzaprine  (FLEXERIL ) 5 MG tablet; Take 1 tablet (5 mg total) by mouth 3 (three) times daily as needed for muscle spasms.  Dispense: 30 tablet; Refill: 1 - ondansetron  (ZOFRAN -ODT) 4 MG disintegrating tablet; Take 1 tablet (4 mg total) by mouth every 8 (eight) hours as needed.  Dispense: 20 tablet; Refill: 0  - Tension headache described with squeezing of the head - No red flags, or warning symptoms, requiring emergent evaluation - Advised to add Cyclobenzaprine  (cut her 10mg  in half as the 10mg  caused dizziness and daytime somnolence); I have  prescribed the 5mg . This can be cut in half as well if needed - Warm compresses - Neck stretches - Make sure to have a supportive pillow for sleep - Continue Ibuprofen  600mg  every 8 hours if needed - Can alternate every 4 hours with Tylenol  if needed - Push fluids - Advised she could follow back and up and low dose prednisone  could be considered but avoiding at this time since could affect breast milk production - Seek in person evaluation if worsening  Follow Up Instructions: I discussed the assessment and treatment plan with the patient. The patient was provided an opportunity to ask questions and all were answered. The patient agreed with the plan and demonstrated an understanding of the instructions.  A copy of instructions were sent to the patient via MyChart unless otherwise noted below.    The patient was advised to call back or seek an in-person evaluation if the symptoms worsen or if the condition fails to improve as anticipated.    Angelia Kelp, PA-C

## 2024-01-14 ENCOUNTER — Encounter: Payer: Self-pay | Admitting: Student in an Organized Health Care Education/Training Program

## 2024-01-14 ENCOUNTER — Ambulatory Visit: Admitting: Physical Therapy

## 2024-01-14 NOTE — Telephone Encounter (Signed)
 FYI

## 2024-01-27 ENCOUNTER — Encounter: Payer: Self-pay | Admitting: Neurology

## 2024-01-27 ENCOUNTER — Other Ambulatory Visit: Payer: Self-pay | Admitting: *Deleted

## 2024-01-27 MED ORDER — OXCARBAZEPINE 300 MG PO TABS: 300.0000 mg | ORAL_TABLET | Freq: Two times a day (BID) | ORAL | 5 refills | Status: DC

## 2024-02-12 DIAGNOSIS — Z419 Encounter for procedure for purposes other than remedying health state, unspecified: Secondary | ICD-10-CM | POA: Diagnosis not present

## 2024-03-13 DIAGNOSIS — Z419 Encounter for procedure for purposes other than remedying health state, unspecified: Secondary | ICD-10-CM | POA: Diagnosis not present

## 2024-03-14 ENCOUNTER — Telehealth: Admitting: Family

## 2024-03-14 DIAGNOSIS — R399 Unspecified symptoms and signs involving the genitourinary system: Secondary | ICD-10-CM | POA: Diagnosis not present

## 2024-03-14 MED ORDER — CEPHALEXIN 500 MG PO CAPS: 500.0000 mg | ORAL_CAPSULE | Freq: Two times a day (BID) | ORAL | 0 refills | Status: DC

## 2024-03-14 NOTE — Progress Notes (Signed)
 Virtual Visit Consent   Melanie Strong, you are scheduled for a virtual visit with a [facility] provider today. Just as with appointments in the office, your consent must be obtained to participate. Your consent will be active for this visit and any virtual visit you may have with one of our providers in the next 365 days. If you have a MyChart account, a copy of this consent can be sent to you electronically.  As this is a virtual visit, video technology does not allow for your provider to perform a traditional examination. This may limit your provider's ability to fully assess your condition. If your provider identifies any concerns that need to be evaluated in person or the need to arrange testing (such as labs, EKG, etc.), we will make arrangements to do so. Although advances in technology are sophisticated, we cannot ensure that it will always work on either your end or our end. If the connection with a video visit is poor, the visit may have to be switched to a telephone visit. With either a video or telephone visit, we are not always able to ensure that we have a secure connection.  By engaging in this virtual visit, you consent to the provision of healthcare and authorize for your insurance to be billed (if applicable) for the services provided during this visit. Depending on your insurance coverage, you may receive a charge related to this service.  I need to obtain your verbal consent now. Are you willing to proceed with your visit today? Melanie Strong has provided verbal consent on 03/14/2024 for a virtual visit (video or telephone). [name], FNP  Date: 03/14/2024 12:18 PM   Virtual Visit via Video Note   I, [name], connected with  Melanie Strong  ([id number], 1986-02-11) on 03/14/24 at 12:30 PM EDT by a video-enabled telemedicine application and verified that I am speaking with the correct person using two identifiers.  Location: Patient: Virtual Visit Location  Patient: Home Provider: Virtual Visit Location Provider: Home Office   I discussed the limitations of evaluation and management by telemedicine and the availability of in person appointments. The patient expressed understanding and agreed to proceed.    History of Present Illness: Melanie Strong is a 38 y.o. who identifies as a female who was assigned female at birth, and is being seen today for UTI symptoms.  HPI: Dysuria  This is a new problem. The current episode started yesterday. The problem occurs every urination. The problem has been gradually worsening. The quality of the pain is described as burning. The pain is at a severity of 4/10. The pain is moderate. Associated symptoms include frequency, hesitancy and urgency. Pertinent negatives include no hematuria, nausea or vomiting. She has tried increased fluids for the symptoms. The treatment provided mild relief.    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 (Patient not taking: Reported on 12/29/2023), Disp: 12 suppository, Rfl: 0   cyclobenzaprine  (FLEXERIL ) 5 MG tablet, Take 1 tablet (5 mg total) by mouth 3 (three) times daily as needed for muscle spasms., Disp: 30 tablet, Rfl: 1    ibuprofen  (ADVIL ) 600 MG tablet, Take 1 tablet (600 mg total) by mouth every 6 (six) hours as needed., Disp: 30 tablet, Rfl: 1   NIFEdipine  (PROCARDIA  XL) 30 MG 24 hr tablet, Take 1 tablet (30 mg total) by mouth daily., Disp: , Rfl:    ondansetron  (ZOFRAN -ODT) 4 MG disintegrating tablet, Take 1 tablet (4 mg total) by mouth every 8 (eight) hours as needed., Disp: 20 tablet, Rfl: 0   Oxcarbazepine  (TRILEPTAL ) 300 MG tablet, Take 1 tablet (300 mg total) by mouth 2 (two) times daily., Disp: 60 tablet, Rfl: 5   Prenatal Vit-Fe Fumarate-FA (MULTIVITAMIN-PRENATAL) 27-0.8 MG TABS tablet, Take 1 tablet by mouth daily before breakfast., Disp: 30 tablet, Rfl: 12   senna-docusate (SENOKOT-S) 8.6-50 MG tablet, Take 2 tablets by mouth 2 (two) times daily as needed for mild constipation. (Patient not taking: Reported on 12/29/2023), Disp: 60 tablet, Rfl: 0  Observations/Objective: Patient is well-developed, well-nourished in no acute distress.  Resting comfortably  at home.  Head is normocephalic, atraumatic.  No labored breathing.  Speech is clear and coherent with logical content.  Patient is alert and oriented at baseline.    Assessment and Plan: 1. UTI symptoms (Primary) - cephALEXin  (KEFLEX ) 500 MG capsule; Take 1 capsule (500 mg total) by mouth 2 (two) times daily.  Dispense: 14 capsule; Refill: 0  Force fluids AZO over the counter X2 days Follow up if symptoms worsen or do not improve   Follow Up Instructions: I discussed the assessment and treatment plan with the patient. The patient was provided an opportunity to ask questions and all were answered. The patient agreed with the plan and demonstrated an understanding of the instructions.  A copy of instructions were sent to the patient via MyChart unless otherwise noted below.     The patient was advised to call back or seek an in-person evaluation if the symptoms worsen or if the condition fails to improve as anticipated.    [name],  FNP

## 2024-03-26 ENCOUNTER — Inpatient Hospital Stay (HOSPITAL_COMMUNITY)
Admission: AD | Admit: 2024-03-26 | Discharge: 2024-03-26 | Discharge status: Against Medical Advice | Attending: Obstetrics & Gynecology | Admitting: Obstetrics & Gynecology

## 2024-03-26 DIAGNOSIS — M549 Dorsalgia, unspecified: Secondary | ICD-10-CM | POA: Insufficient documentation

## 2024-03-26 DIAGNOSIS — Z3202 Encounter for pregnancy test, result negative: Secondary | ICD-10-CM | POA: Diagnosis not present

## 2024-03-26 LAB — POCT PREGNANCY, URINE: Preg Test, Ur: NEGATIVE

## 2024-03-26 NOTE — MAU Note (Signed)
 Melanie Strong is a 38 y.o. at Unknown here in MAU reporting: period was late - took pregnancy test - line was very light. Reports having mid-lower back pain. Had UTI - was on antibiotics for 7 days and finished it this past Sunday. Denies VB or abnormal discharge.   LMP: 7/28 Onset of complaint: ongoing since having UTI Pain score: 5 Vitals:   03/26/24 1958  BP: 129/86  Pulse: 69  Resp: 17  Temp: 99.2 F (37.3 C)  SpO2: 100%     FHT: NA  Lab orders placed from triage: urine pregnancy; UA

## 2024-04-13 DIAGNOSIS — Z419 Encounter for procedure for purposes other than remedying health state, unspecified: Secondary | ICD-10-CM | POA: Diagnosis not present

## 2024-05-14 DIAGNOSIS — Z419 Encounter for procedure for purposes other than remedying health state, unspecified: Secondary | ICD-10-CM | POA: Diagnosis not present

## 2024-05-26 ENCOUNTER — Ambulatory Visit: Admitting: Student in an Organized Health Care Education/Training Program

## 2024-05-26 ENCOUNTER — Ambulatory Visit

## 2024-05-26 ENCOUNTER — Encounter: Payer: Self-pay | Admitting: Student in an Organized Health Care Education/Training Program

## 2024-05-26 VITALS — BP 125/66 | HR 68 | Wt 168.0 lb

## 2024-05-26 VITALS — BP 128/83 | HR 85 | Wt 162.7 lb

## 2024-05-26 DIAGNOSIS — G609 Hereditary and idiopathic neuropathy, unspecified: Secondary | ICD-10-CM

## 2024-05-26 DIAGNOSIS — Z3201 Encounter for pregnancy test, result positive: Secondary | ICD-10-CM | POA: Diagnosis not present

## 2024-05-26 DIAGNOSIS — Z3A01 Less than 8 weeks gestation of pregnancy: Secondary | ICD-10-CM

## 2024-05-26 DIAGNOSIS — R7303 Prediabetes: Secondary | ICD-10-CM

## 2024-05-26 DIAGNOSIS — I10 Essential (primary) hypertension: Secondary | ICD-10-CM | POA: Diagnosis not present

## 2024-05-26 DIAGNOSIS — Z3200 Encounter for pregnancy test, result unknown: Secondary | ICD-10-CM

## 2024-05-26 LAB — POCT GLYCOSYLATED HEMOGLOBIN (HGB A1C): Hemoglobin A1C: 5.4 % (ref 4.0–5.6)

## 2024-05-26 LAB — POCT URINE PREGNANCY: Preg Test, Ur: POSITIVE — AB

## 2024-05-26 MED ORDER — DOXYLAMINE-PYRIDOXINE 10-10 MG PO TBEC: 2.0000 | DELAYED_RELEASE_TABLET | Freq: Every day | ORAL | 2 refills | Status: DC

## 2024-05-26 MED ORDER — DICLEGIS 10-10 MG PO TBEC: 2.0000 | DELAYED_RELEASE_TABLET | Freq: Every day | ORAL | 2 refills | Status: DC

## 2024-05-26 MED ORDER — VITAFOL ULTRA 29-0.6-0.4-200 MG PO CAPS: 1.0000 | ORAL_CAPSULE | Freq: Every day | ORAL | 11 refills | Status: DC

## 2024-05-26 MED ORDER — DULOXETINE HCL 30 MG PO CPEP: 30.0000 mg | ORAL_CAPSULE | Freq: Every day | ORAL | 1 refills | Status: DC

## 2024-05-26 NOTE — Progress Notes (Signed)
 Established Patient Office Visit  Subjective   Patient ID: Melanie Strong, female    DOB: [birth year]  Age: 38 y.o. MRN: [id number]  Chief Complaint  Patient presents with   Medical Management of Chronic Issues    6 month Follow up from 3/24     HPI  38 year old woman here for 90-month follow-up of hypertension and prediabetes.  She is newly pregnant.  Last menstrual period was August 23, reports a positive pregnancy test just last Friday.  Has a new patient appointment with an obstetrician later today.  This will be her fourth child, all will be under the age of 5 which will be difficult.  No depressed mood, she is excited for the pregnancy.  Reports good support at home.  No nausea or vomiting.  No bleeding.  Continues to use nifedipine  and is using a folic acid gummy that makes her a little nauseous.  Her main concern today is over a sensation of burning in both of her feet, worst on her right side.  Bothers her every day, has been an issue since her last pregnancy over a year ago.  Got better for period of time but had consultation about 6 months ago for this.  Tried gabapentin  with no improvement.  Started oxcarbazepine  in June, not much improvement with that either.  She stopped this medication when she found out she was pregnant.  She reports that symptoms are significant, bother her on a daily basis, sometimes limit her activities and function.     Objective:     BP 125/66   Pulse 68    Physical Exam  Gen: Well-appearing woman Heart: Regular, no murmur Lungs: Unlabored, clear throughout Abd: Soft, nontender, nondistended, uterus not palpable yet Ext: Warm, no edema, normal joints Feet: Bilateral feet are warm, normal DP pulses, mildly diminished sensation on monofilament testing of the great toes but otherwise pretty normal.  No skin breakdown.   Results for orders placed or performed in visit on 05/26/24  POCT glycosylated hemoglobin (Hb A1C)  Result Value Ref Range    Hemoglobin A1C 5.4 4.0 - 5.6 %   HbA1c POC (<> result, manual entry)     HbA1c, POC (prediabetic range)     HbA1c, POC (controlled diabetic range)        Assessment & Plan:   Problem List Items Addressed This Visit       High   Pregnancy (Chronic)   Last menstrual period was August 23.  Positive urine pregnancy test September 19.  This is her fourth pregnancy.  Hypertension is well-controlled today.  History of prediabetes, well-controlled today.  She has a new patient appointment with an obstetrician later today.  Currently using a folic acid gummy.      Prediabetes - Primary (Chronic)   Chronic and stable.  A1c today excellent control at 5.4%.  Now that she is pregnant she will be at risk for gestational diabetes.  Will get a glucose tolerance test at the usual time.      Relevant Orders   POCT glycosylated hemoglobin (Hb A1C) (Completed)   Hypertension (Chronic)   Chronic and stable.  Blood pressure well-controlled today.  Will continue nifedipine  30 mg daily.        Medium    Peripheral neuropathy (Chronic)   This is the most difficult problem for her at this point.  Causing her a lot of stress.  Starting to come function getting.  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  Exam today is reassuring, slight decrease sensation on monofilament testing, but no skin breakdown.  We talked about supportive care and topical care including lidocaine  patches over-the-counter.  She found no benefit to using gabapentin .  Used oxcarbazepine  for a period around June, but found not much benefit.  I recommended a trial of duloxetine , low-dose 30 mg daily.  We talked about category C safety for pregnancy, I asked her to consult with her obstetrician about the safety of this medication to the fetus.  If the medication is not helpful for the neuropathy, would recommend discontinuing after a few weeks.  But I think it  is worth a try because the neuropathy is very bothersome to her.      Relevant Medications   DULoxetine  (CYMBALTA ) 30 MG capsule    Return if symptoms worsen or fail to improve.    Cleatus Debby Specking, MD

## 2024-05-26 NOTE — Assessment & Plan Note (Signed)
 Last menstrual period was August 23.  Positive urine pregnancy test September 19.  This is her fourth pregnancy.  Hypertension is well-controlled today.  History of prediabetes, well-controlled today.  She has a new patient appointment with an obstetrician later today.  Currently using a folic acid gummy.

## 2024-05-26 NOTE — Assessment & Plan Note (Signed)
 Chronic and stable.  Blood pressure well-controlled today.  Will continue nifedipine  30 mg daily.

## 2024-05-26 NOTE — Progress Notes (Signed)
..  Melanie Strong presents today for UPT. She complains of nausea. LMP:04/24/2024    OBJECTIVE: Appears well, in no apparent distress.  OB History     Gravida  5   Para  3   Term  3   Preterm      AB  1   Living  3      SAB  1   IAB  0   Ectopic  0   Multiple  0   Live Births  3          Home UPT Result:Positive In-Office UPT result:Positive I have reviewed the patient's medical, obstetrical, social, and family histories, and medications.   ASSESSMENT: Positive pregnancy test  PLAN Prenatal care to be completed at: [facility]  sent to pharmacy.

## 2024-05-26 NOTE — Assessment & Plan Note (Signed)
 This is the most difficult problem for her at this point.  Causing her a lot of stress.  Starting to come function getting.  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  Exam today is reassuring, slight decrease sensation on monofilament testing, but no skin breakdown.  We talked about supportive care and topical care including lidocaine  patches over-the-counter.  She found no benefit to using gabapentin .  Used oxcarbazepine  for a period around June, but found not much benefit.  I recommended a trial of duloxetine , low-dose 30 mg daily.  We talked about category C safety for pregnancy, I asked her to consult with her obstetrician about the safety of this medication to the fetus.  If the medication is not helpful for the neuropathy, would recommend discontinuing after a few weeks.  But I think it is worth a try because the neuropathy is very bothersome to her.

## 2024-05-26 NOTE — Assessment & Plan Note (Signed)
 Chronic and stable.  A1c today excellent control at 5.4%.  Now that she is pregnant she will be at risk for gestational diabetes.  Will get a glucose tolerance test at the usual time.

## 2024-05-27 MED ORDER — MAGNESIUM OXIDE 400 MG PO TABS: 400.0000 mg | ORAL_TABLET | Freq: Every day | ORAL | 1 refills | Status: DC

## 2024-05-27 MED ORDER — ALPHA-LIPOIC ACID 600 MG PO TABS
1.0000 | ORAL_TABLET | Freq: Every day | ORAL | 1 refills | Status: AC
Start: 2024-05-27 — End: ?

## 2024-05-27 NOTE — Addendum Note (Signed)
 Addended by: JERRELL SOLIAN T on: 05/27/2024 08:00 AM   Modules accepted: Orders

## 2024-05-31 ENCOUNTER — Telehealth: Payer: Self-pay | Admitting: Student in an Organized Health Care Education/Training Program

## 2024-05-31 ENCOUNTER — Encounter: Payer: Self-pay | Admitting: Student in an Organized Health Care Education/Training Program

## 2024-05-31 ENCOUNTER — Other Ambulatory Visit: Payer: Self-pay

## 2024-05-31 ENCOUNTER — Encounter: Payer: Self-pay | Admitting: Obstetrics and Gynecology

## 2024-05-31 MED ORDER — DICLEGIS 10-10 MG PO TBEC: 2.0000 | DELAYED_RELEASE_TABLET | Freq: Every day | ORAL | 2 refills | Status: AC

## 2024-05-31 NOTE — Telephone Encounter (Signed)
 I tried to call the patient and left a voicemail.  Would like to talk to her about medication changes for management of neuropathy.  After discussing with her neurologist, would like to try prescription doses of magnesium  and alpha lipoic fatty acid first.  I sent those medications to her pharmacy and those would be safe and likely effective for managing her nerve discomfort in her feet.  I recommend not using duloxetine  for now, unless these medications are ineffective.

## 2024-05-31 NOTE — Telephone Encounter (Signed)
 LVM to call office.

## 2024-06-01 NOTE — Telephone Encounter (Signed)
 Pt sent PCP a message on MyChart

## 2024-06-16 ENCOUNTER — Inpatient Hospital Stay (HOSPITAL_COMMUNITY)

## 2024-06-16 ENCOUNTER — Inpatient Hospital Stay (HOSPITAL_COMMUNITY)
Admission: AD | Admit: 2024-06-16 | Discharge: 2024-06-16 | Disposition: A | Discharge status: Home / Self Care | Attending: Obstetrics and Gynecology | Admitting: Obstetrics and Gynecology

## 2024-06-16 ENCOUNTER — Other Ambulatory Visit: Payer: Self-pay

## 2024-06-16 DIAGNOSIS — O99011 Anemia complicating pregnancy, first trimester: Secondary | ICD-10-CM | POA: Diagnosis not present

## 2024-06-16 DIAGNOSIS — N939 Abnormal uterine and vaginal bleeding, unspecified: Secondary | ICD-10-CM | POA: Diagnosis not present

## 2024-06-16 DIAGNOSIS — O208 Other hemorrhage in early pregnancy: Secondary | ICD-10-CM | POA: Insufficient documentation

## 2024-06-16 DIAGNOSIS — O26891 Other specified pregnancy related conditions, first trimester: Secondary | ICD-10-CM | POA: Diagnosis not present

## 2024-06-16 DIAGNOSIS — Z3A01 Less than 8 weeks gestation of pregnancy: Secondary | ICD-10-CM | POA: Insufficient documentation

## 2024-06-16 DIAGNOSIS — Z3A08 8 weeks gestation of pregnancy: Secondary | ICD-10-CM

## 2024-06-16 DIAGNOSIS — O09511 Supervision of elderly primigravida, first trimester: Secondary | ICD-10-CM | POA: Diagnosis not present

## 2024-06-16 LAB — CBC
HCT: 34.3 % — ABNORMAL LOW (ref 36.0–46.0)
Hemoglobin: 11.4 g/dL — ABNORMAL LOW (ref 12.0–15.0)
MCH: 26.3 pg (ref 26.0–34.0)
MCHC: 33.2 g/dL (ref 30.0–36.0)
MCV: 79 fL — ABNORMAL LOW (ref 80.0–100.0)
Platelets: 249 K/uL (ref 150–400)
RBC: 4.34 MIL/uL (ref 3.87–5.11)
RDW: 13.4 % (ref 11.5–15.5)
WBC: 7.9 K/uL (ref 4.0–10.5)
nRBC: 0 % (ref 0.0–0.2)

## 2024-06-16 LAB — WET PREP, GENITAL
Clue Cells Wet Prep HPF POC: NONE SEEN
Sperm: NONE SEEN
Trich, Wet Prep: NONE SEEN
WBC, Wet Prep HPF POC: <10 (ref <10)
Yeast Wet Prep HPF POC: NONE SEEN

## 2024-06-16 LAB — URINALYSIS, ROUTINE W REFLEX MICROSCOPIC
Bilirubin Urine: NEGATIVE
Glucose, UA: NEGATIVE mg/dL
Ketones, ur: NEGATIVE mg/dL
Nitrite: NEGATIVE
Protein, ur: NEGATIVE mg/dL
Specific Gravity, Urine: 1.009 (ref 1.005–1.030)
pH: 6 (ref 5.0–8.0)

## 2024-06-16 LAB — HCG, QUANTITATIVE, PREGNANCY: hCG, Beta Chain, Quant, S: 127221 m[IU]/mL — ABNORMAL HIGH (ref <5)

## 2024-06-16 NOTE — MAU Provider Note (Signed)
 History     [id number]  Arrival date and time: 06/16/24 1744    Chief Complaint  Patient presents with   Vaginal Bleeding     HPI Melanie Strong is a 38 y.o. at [redacted]w[redacted]d by LMP, who presents for spotting after wiping. Last had sex two days ago. Denies abdominal pain. Denies dysuria, increased urgency or frequency.       --/--/O POS (01/29 1032)  Past Medical History:  Diagnosis Date   Anemia    Diabetes mellitus without complication (HCC)    Genital herpes 07/11/2021   Gestational diabetes    History of cesarean section 06/19/2022   LTCS 12/22 due to NRFHT and SPEC     History of ELISA positive for HSV 06/19/2022   History of ileus    Hx of preeclampsia, prior pregnancy, currently pregnant 03/20/2022   Pregnancy induced hypertension     Past Surgical History:  Procedure Laterality Date   CESAREAN SECTION  08/23/2021   Procedure: CESAREAN SECTION;  Surgeon: Eldonna Suzen Octave, MD;  [facility];  Service: Obstetrics;;   CESAREAN SECTION N/A 10/04/2022   Procedure: CESAREAN SECTION;  Surgeon: Eveline Lynwood MATSU, MD;  [facility];  Service: Obstetrics;  Laterality: N/A;   CESAREAN SECTION N/A 10/03/2023   Procedure: CESAREAN SECTION;  Surgeon: Cris Burnard DEL, MD;  [facility];  Service: Obstetrics;  Laterality: N/A;    Family History  Problem Relation Age of Onset   Hypertension Mother    Diabetes Mother        Boarderline   Lupus Mother        2nd opinion says no/ unsure   Cancer Father        rectal   Sickle cell anemia Sister    Healthy Brother    Healthy Brother    Cancer Paternal Aunt        brain   Cancer Paternal Uncle        stomach   Diabetes Maternal Grandmother    Other Neg Hx    Asthma Neg Hx    Heart disease Neg Hx    Stroke Neg Hx     Social History   Socioeconomic History   Marital status: Married    Spouse name: Not on file   Number of children: Not on file   Years of education: Not on file   Highest  education level: 11th grade  Occupational History   Not on file  Tobacco Use   Smoking status: Some Days    Current packs/day: 0.25    Average packs/day: 0.3 packs/day for 10.0 years (2.5 ttl pk-yrs)    Types: Cigarettes    Passive exposure: Never   Smokeless tobacco: Never  Vaping Use   Vaping status: Never Used  Substance and Sexual Activity   Alcohol use: No    Comment: rare   Drug use: No   Sexual activity: Yes    Partners: Male    Birth control/protection: None  Other Topics Concern   Not on file  Social History Narrative   Not on file   Social Drivers of Health   Financial Resource Strain: Low Risk  (05/24/2024)   Overall Financial Resource Strain (CARDIA)    Difficulty of Paying Living Expenses: Not hard at all  Food Insecurity: No Food Insecurity (05/24/2024)   Hunger Vital Sign    Worried About Running Out of Food in the Last Year: Never true    Ran Out of  Food in the Last Year: Never true  Transportation Needs: No Transportation Needs (05/24/2024)   PRAPARE - Administrator, Civil Service (Medical): No    Lack of Transportation (Non-Medical): No  Physical Activity: Inactive (05/24/2024)   Exercise Vital Sign    Days of Exercise per Week: 0 days    Minutes of Exercise per Session: Not on file  Stress: No Stress Concern Present (05/24/2024)   [employer] of Occupational Health - Occupational Stress Questionnaire    Feeling of Stress: Not at all  Social Connections: Socially Isolated (05/24/2024)   Social Connection and Isolation Panel    Frequency of Communication with Friends and Family: More than three times a week    Frequency of Social Gatherings with Friends and Family: Once a week    Attends Religious Services: Never    Database administrator or Organizations: No    Attends Engineer, structural: Not on file    Marital Status: Separated  Intimate Partner Violence: Not At Risk (10/11/2023)   Humiliation, Afraid, Rape, and Kick  questionnaire    Fear of Current or Ex-Partner: No    Emotionally Abused: No    Physically Abused: No    Sexually Abused: No    No Known Allergies  No current facility-administered medications on file prior to encounter.   Current Outpatient Medications on File Prior to Encounter  Medication Sig Dispense Refill   Alpha-Lipoic Acid 600 MG TABS Take 1 tablet by mouth daily. 90 tablet 1   DICLEGIS  10-10 MG TBEC Take 2 tablets by mouth at bedtime. May add 1 tablet at breakfast, and 1 tablet at lunch if needed 100 tablet 2   magnesium  oxide (MAG-OX) 400 MG tablet Take 1 tablet (400 mg total) by mouth daily. 90 tablet 1   NIFEdipine  (PROCARDIA  XL) 30 MG 24 hr tablet Take 1 tablet (30 mg total) by mouth daily.     Prenatal Vit-Fe Fumarate-FA (MULTIVITAMIN-PRENATAL) 27-0.8 MG TABS tablet Take 1 tablet by mouth daily before breakfast. 30 tablet 12    Pertinent positives and negative per HPI, all others reviewed and negative  Physical Exam   BP (!) 107/50 (BP Location: Right Arm)   Pulse 83   Temp 98.9 F (37.2 C)   Resp 16   LMP 04/24/2024 (Approximate)   Patient Vitals for the past 24 hrs:  BP Temp Pulse Resp  06/16/24 1843 (!) 107/50 98.9 F (37.2 C) 83 16    Physical Exam Vitals and nursing note reviewed.  Constitutional:      Appearance: She is well-developed.  HENT:     Head: Normocephalic and atraumatic.     Mouth/Throat:     Mouth: Mucous membranes are moist.  Eyes:     Extraocular Movements: Extraocular movements intact.  Cardiovascular:     Rate and Rhythm: Normal rate and regular rhythm.  Pulmonary:     Effort: Pulmonary effort is normal.  Abdominal:     Palpations: Abdomen is soft.     Tenderness: There is no abdominal tenderness.  Skin:    Capillary Refill: Capillary refill takes less than 2 seconds.  Neurological:     General: No focal deficit present.     Mental Status: She is alert.      Labs Results for orders placed or performed during the  hospital encounter of 06/16/24 (from the past 24 hours)  Urinalysis, Routine w reflex microscopic -Urine, Clean Catch     Status: Abnormal   Collection  Time: 06/16/24  6:48 PM  Result Value Ref Range   Color, Urine YELLOW YELLOW   APPearance HAZY (A) CLEAR   Specific Gravity, Urine 1.009 1.005 - 1.030   pH 6.0 5.0 - 8.0   Glucose, UA NEGATIVE NEGATIVE mg/dL   Hgb urine dipstick MODERATE (A) NEGATIVE   Bilirubin Urine NEGATIVE NEGATIVE   Ketones, ur NEGATIVE NEGATIVE mg/dL   Protein, ur NEGATIVE NEGATIVE mg/dL   Nitrite NEGATIVE NEGATIVE   Leukocytes,Ua SMALL (A) NEGATIVE   RBC / HPF 0-5 0 - 5 RBC/hpf   WBC, UA 0-5 0 - 5 WBC/hpf   Bacteria, UA MANY (A) NONE SEEN   Squamous Epithelial / HPF 0-5 0 - 5 /HPF   Mucus PRESENT   CBC     Status: Abnormal   Collection Time: 06/16/24  8:47 PM  Result Value Ref Range   WBC 7.9 4.0 - 10.5 K/uL   RBC 4.34 3.87 - 5.11 MIL/uL   Hemoglobin 11.4 (L) 12.0 - 15.0 g/dL   HCT 65.6 (L) 63.9 - 53.9 %   MCV 79.0 (L) 80.0 - 100.0 fL   MCH 26.3 26.0 - 34.0 pg   MCHC 33.2 30.0 - 36.0 g/dL   RDW 86.5 88.4 - 84.4 %   Platelets 249 150 - 400 K/uL   nRBC 0.0 0.0 - 0.2 %  hCG, quantitative, pregnancy     Status: Abnormal   Collection Time: 06/16/24  8:47 PM  Result Value Ref Range   hCG, Beta Chain, Quant, S 127,221 (H) <5 mIU/mL  Wet prep, genital     Status: None   Collection Time: 06/16/24  9:29 PM   Specimen: PATH Cytology Cervicovaginal Ancillary Only  Result Value Ref Range   Yeast Wet Prep HPF POC NONE SEEN NONE SEEN   Trich, Wet Prep NONE SEEN NONE SEEN   Clue Cells Wet Prep HPF POC NONE SEEN NONE SEEN   WBC, Wet Prep HPF POC <10 <10   Sperm NONE SEEN     Imaging US  OB Comp Less 14 Wks Result Date: 06/16/2024 EXAM: OBSTETRIC ULTRASOUND FIRST TRIMESTER TECHNIQUE: Transvaginal first trimester obstetric pelvic duplex ultrasound was performed with real-time imaging, color flow Doppler imaging, and spectral analysis. COMPARISON: None provided.  CLINICAL HISTORY: Vaginal bleeding in pregnancy, first trimester. FINDINGS: UTERUS: No focal myometrial mass. GESTATIONAL SAC(S): Single normal appearing intrauterine gestational sac. There is a small subchorionic hemorrhage adjacent to the anterior gestational sac measuring 1.1 x 1.3 x 0.5 cm. YOLK SAC: Present EMBRYO(<11WK) /FETUS(>=11WK): Single CROWN RUMP LENGTH: 18.1 mm corresponding to gestational age of [redacted] weeks and 2 days. RATE OF CARDIAC ACTIVITY: 165 beats per minute. RIGHT OVARY: Unremarkable. Normal arterial and venous flow. LEFT OVARY: Unremarkable. Normal arterial and venous flow. FREE FLUID: No free fluid. MEASUREMENTS ESTIMATED GESTATIONAL AGE BY CURRENT ULTRASOUND: 8 weeks and 2 days. ESTIMATED GESTATIONAL AGE BY LMP/PRIOR ULTRASOUND: Not provided. ESTIMATED DUE DATE: 01/24/2025. IMPRESSION: 1. Viable intrauterine pregnancy measuring 8 weeks 2 days by crown-rump length; estimated due date 01/24/2025 2. Small subchorionic hemorrhage adjacent to the anterior gestational sac measuring 1.1 x 1.3 x 0.5 cm Electronically signed by: Greig Pique MD 06/16/2024 09:29 PM EDT RP Workstation: [id number]    MAU Course  Procedures Lab Orders         Wet prep, genital         Urinalysis, Routine w reflex microscopic -Urine, Clean Catch         CBC  hCG, quantitative, pregnancy    No orders of the defined types were placed in this encounter.  Imaging Orders         US  OB Comp Less 14 Wks     MDM Moderate (Level 3-4)  Assessment and Plan  #Vaginal bleeding in pregnancy, first trimester #[redacted] weeks gestation of pregnancy   Melanie Strong is a 38 y.o. at [redacted]w[redacted]d by LMP, who presents for spotting after wiping.  -HCG [phone] congruent with dating -CBC shows mild anemia with Hgb of 11.4 -No evidence of infection on wet prep -US  confirms SIUP with small SCH, with FHR of 165  -Safe to discharge home -Patient has prenatal appointment next Wed at [facility]. Encouraged to keep this -All questions  answered, anticipatory guidance and detailed return precautions provided.    Alaya Iverson L Simpson Paulos, MD/MHA 06/17/24 3:47 AM  Allergies as of 06/16/2024   No Known Allergies      Medication List     STOP taking these medications    Vitafol  Ultra 29-0.6-0.4-200 MG Caps       TAKE these medications    Alpha-Lipoic Acid 600 MG Tabs Take 1 tablet by mouth daily.   Diclegis  10-10 MG Tbec Generic drug: Doxylamine -Pyridoxine  Take 2 tablets by mouth at bedtime. May add 1 tablet at breakfast, and 1 tablet at lunch if needed   magnesium  oxide 400 MG tablet Commonly known as: MAG-OX Take 1 tablet (400 mg total) by mouth daily.   multivitamin-prenatal 27-0.8 MG Tabs tablet Take 1 tablet by mouth daily before breakfast.   NIFEdipine  30 MG 24 hr tablet Commonly known as: Procardia  XL Take 1 tablet (30 mg total) by mouth daily.

## 2024-06-16 NOTE — MAU Note (Signed)
 Melanie Strong is a 38 y.o. at [redacted]w[redacted]d here in MAU reporting: spotting noted after wiping 1630, no c/o abd pain  Last sex 2 days ago  Onset of complaint: 1630 Pain score: 0/10 Vitals:   06/16/24 1843  BP: (!) 107/50  Pulse: 83  Resp: 16  Temp: 98.9 F (37.2 C)     FHT: na  Lab orders placed from triage: ua

## 2024-06-17 LAB — GC/CHLAMYDIA PROBE AMP (~~LOC~~) NOT AT ARMC
Chlamydia: NEGATIVE
Comment: NEGATIVE
Comment: NORMAL
Neisseria Gonorrhea: NEGATIVE

## 2024-06-23 ENCOUNTER — Encounter: Payer: Self-pay | Admitting: Obstetrics and Gynecology

## 2024-06-23 ENCOUNTER — Ambulatory Visit (INDEPENDENT_AMBULATORY_CARE_PROVIDER_SITE_OTHER): Admitting: *Deleted

## 2024-06-23 ENCOUNTER — Other Ambulatory Visit (INDEPENDENT_AMBULATORY_CARE_PROVIDER_SITE_OTHER): Payer: Self-pay

## 2024-06-23 VITALS — BP 121/70 | HR 89 | Wt 163.7 lb

## 2024-06-23 DIAGNOSIS — O0991 Supervision of high risk pregnancy, unspecified, first trimester: Secondary | ICD-10-CM | POA: Diagnosis not present

## 2024-06-23 DIAGNOSIS — O0990 Supervision of high risk pregnancy, unspecified, unspecified trimester: Secondary | ICD-10-CM

## 2024-06-23 DIAGNOSIS — Z3A08 8 weeks gestation of pregnancy: Secondary | ICD-10-CM

## 2024-06-23 DIAGNOSIS — O3680X0 Pregnancy with inconclusive fetal viability, not applicable or unspecified: Secondary | ICD-10-CM

## 2024-06-23 MED ORDER — PROMETHAZINE HCL 25 MG PO TABS: 25.0000 mg | ORAL_TABLET | Freq: Four times a day (QID) | ORAL | 1 refills | Status: AC | PRN

## 2024-06-23 MED ORDER — BLOOD PRESSURE KIT DEVI: 1.0000 | 0 refills | Status: AC

## 2024-06-23 NOTE — Progress Notes (Signed)
 New OB Intake  I connected with Melanie Strong  on 06/23/24 at  9:15 AM EDT by In Person Visit and verified that I am speaking with the correct person using two identifiers. Nurse is located at [facility] and pt is located at [place].  I discussed the limitations, risks, security and privacy concerns of performing an evaluation and management service by telephone and the availability of in person appointments. I also discussed with the patient that there may be a patient responsible charge related to this service. The patient expressed understanding and agreed to proceed.  I explained I am completing New OB Intake today. We discussed EDD of 01/29/25 based on LMP of 04/24/24. Pt is [id number]. I reviewed her allergies, medications and Medical/Surgical/OB history.    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 Discussed the nature of our practice with multiple providers including residents and students as well as female and female providers. Due to the size of the practice, the delivering provider may not be the same as those providing prenatal care.   MyChart/Babyscripts MyChart access verified. I explained pt will have some visits in office and some virtually. Babyscripts instructions given and order placed. Patient verifies receipt of registration text/e-mail. Account successfully created and app downloaded. If patient is a candidate for Optimized scheduling, add to sticky note.   Blood Pressure Cuff/Weight Scale Blood pressure cuff ordered for patient to pick-up from [employer]. Explained after first prenatal appt pt will check weekly and  document in Babyscripts. Patient does not have weight scale; patient may purchase if they desire to track weight weekly in Babyscripts.  Anatomy US  Explained first scheduled US  will be around 19 weeks. Anatomy US  scheduled for TBD at TBD.  Is patient a candidate for Babyscripts Optimization? No, due to Risk Factors   First visit review I reviewed new OB appt with patient. Explained pt will be seen by Dr. Eveline at first visit. Discussed [place] genetic screening with patient. Requests Panorama. Routine prenatal labs OB Urine collected at today's visit.   Last Pap Diagnosis  Date Value Ref Range Status  02/01/2021   Final   - Negative for intraepithelial lesion or malignancy (NILM)    Rocky CHRISTELLA Ober, RN 06/23/2024  9:28 AM

## 2024-06-23 NOTE — Patient Instructions (Signed)

## 2024-06-25 LAB — URINE CULTURE, OB REFLEX

## 2024-06-25 LAB — CULTURE, OB URINE

## 2024-07-01 ENCOUNTER — Ambulatory Visit: Payer: Self-pay | Admitting: Obstetrics & Gynecology

## 2024-07-14 ENCOUNTER — Other Ambulatory Visit (HOSPITAL_COMMUNITY)
Admission: RE | Admit: 2024-07-14 | Discharge: 2024-07-14 | Disposition: A | Source: Ambulatory Visit | Attending: Obstetrics & Gynecology | Admitting: Obstetrics & Gynecology

## 2024-07-14 ENCOUNTER — Ambulatory Visit: Admitting: Obstetrics & Gynecology

## 2024-07-14 ENCOUNTER — Encounter: Admitting: Obstetrics & Gynecology

## 2024-07-14 VITALS — BP 115/71 | HR 80 | Wt 167.0 lb

## 2024-07-14 DIAGNOSIS — O09291 Supervision of pregnancy with other poor reproductive or obstetric history, first trimester: Secondary | ICD-10-CM

## 2024-07-14 DIAGNOSIS — Z98891 History of uterine scar from previous surgery: Secondary | ICD-10-CM

## 2024-07-14 DIAGNOSIS — O0990 Supervision of high risk pregnancy, unspecified, unspecified trimester: Secondary | ICD-10-CM

## 2024-07-14 DIAGNOSIS — O0991 Supervision of high risk pregnancy, unspecified, first trimester: Secondary | ICD-10-CM

## 2024-07-14 DIAGNOSIS — O09299 Supervision of pregnancy with other poor reproductive or obstetric history, unspecified trimester: Secondary | ICD-10-CM

## 2024-07-14 DIAGNOSIS — Z8632 Personal history of gestational diabetes: Secondary | ICD-10-CM

## 2024-07-14 DIAGNOSIS — Z3A11 11 weeks gestation of pregnancy: Secondary | ICD-10-CM

## 2024-07-14 DIAGNOSIS — Z419 Encounter for procedure for purposes other than remedying health state, unspecified: Secondary | ICD-10-CM | POA: Diagnosis not present

## 2024-07-14 NOTE — Progress Notes (Signed)
 Subjective:Nausea    Melanie Strong is a [id number] [redacted]w[redacted]d being seen today for her first obstetrical visit.  Her obstetrical history is significant for advanced maternal age. Patient does intend to breast feed. Pregnancy history fully reviewed.  Patient reports nausea.  Vitals:   07/14/24 1331  BP: 115/71  Pulse: 80  Weight: 167 lb (75.8 kg)    HISTORY: OB History  Gravida Para Term Preterm AB Living  5 3 3  1 3   SAB IAB Ectopic Multiple Live Births  1 0 0 0 3    # Outcome Date GA Lbr Len/2nd Weight Sex Type Anes PTL Lv  5 Current           4 Term 10/03/23 [redacted]w[redacted]d  6 lb 14.8 oz (3.14 kg) F CS-LTranv Spinal  LIV  3 Term 10/04/22 [redacted]w[redacted]d  8 lb 5 oz (3.77 kg) M CS-LTranv Spinal  LIV  2 Term 08/23/21 [redacted]w[redacted]d  8 lb 11 oz (3.94 kg) F CS-LTranv EPI  LIV  1 SAB 06/29/20 [redacted]w[redacted]d    SAB      Past Medical History:  Diagnosis Date   Anemia    Diabetes mellitus without complication (HCC)    Genital herpes 07/11/2021   Gestational diabetes    History of cesarean section 06/19/2022   LTCS 12/22 due to NRFHT and SPEC     History of ELISA positive for HSV 06/19/2022   History of ileus    Hx of preeclampsia, prior pregnancy, currently pregnant 03/20/2022   Neuropathy    right foot   Pregnancy induced hypertension    Past Surgical History:  Procedure Laterality Date   CESAREAN SECTION  08/23/2021   Procedure: CESAREAN SECTION;  Surgeon: Eldonna Suzen Octave, MD;  [facility];  Service: Obstetrics;;   CESAREAN SECTION N/A 10/04/2022   Procedure: CESAREAN SECTION;  Surgeon: Eveline Lynwood MATSU, MD;  [facility];  Service: Obstetrics;  Laterality: N/A;   CESAREAN SECTION N/A 10/03/2023   Procedure: CESAREAN SECTION;  Surgeon: Cris Burnard DEL, MD;  [facility]: [facility];  Service: Obstetrics;  Laterality: N/A;   Family History  Problem Relation Age of Onset   Hypertension Mother    Diabetes Mother        Boarderline   Lupus Mother        2nd opinion says no/ unsure   Cancer  Father        rectal   Sickle cell anemia Sister    Healthy Brother    Healthy Brother    Cancer Paternal Aunt        brain   Cancer Paternal Uncle        stomach   Diabetes Maternal Grandmother    Other Neg Hx    Asthma Neg Hx    Heart disease Neg Hx    Stroke Neg Hx      Exam    Uterus:   12 week  Pelvic Exam:    Perineum: No Hemorrhoids   Vulva: normal   Vagina:  normal mucosa   pH:    Cervix: no lesions   Adnexa: normal adnexa   Bony Pelvis: average  System: Breast:  normal appearance, no masses or tenderness   Skin: normal coloration and turgor, no rashes    Neurologic: oriented, normal mood   Extremities: normal strength, tone, and muscle mass   HEENT PERRLA   Mouth/Teeth mucous membranes moist, pharynx normal without lesions   Neck supple   Cardiovascular: regular rate  and rhythm   Respiratory:  appears well, vitals normal, no respiratory distress, acyanotic, normal RR   Abdomen: soft, non-tender; bowel sounds normal; no masses,  no organomegaly   Urinary: urethral meatus normal      Assessment:    Pregnancy: [id number] Patient Active Problem List   Diagnosis Date Noted   Supervision of high risk pregnancy, antepartum 06/23/2024   Prediabetes 11/24/2023   Hypertension 11/24/2023   Peripheral neuropathy 11/24/2023   Diastasis recti 11/24/2023   Pregnancy 10/03/2023   Alpha thalassemia silent carrier 02/13/2021   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 Prenatal vitamins. Problem list reviewed and updated. Genetic Screening discussed : ordered.  Ultrasound discussed; fetal survey: ordered.  Follow up in 4 weeks. 50% of  30 min visit spent on counseling and coordination of care.     Lynwood Solomons 07/14/2024

## 2024-07-14 NOTE — Progress Notes (Addendum)
 New OB, c/o burning after she voids. Pt needs her FMLA for Intermittent leave filled out today.

## 2024-07-15 LAB — HCV INTERPRETATION

## 2024-07-15 LAB — HEMOGLOBIN A1C
Est. average glucose Bld gHb Est-mCnc: 114 mg/dL
Hgb A1c MFr Bld: 5.6 % (ref 4.8–5.6)

## 2024-07-15 LAB — CERVICOVAGINAL ANCILLARY ONLY
Bacterial Vaginitis (gardnerella): NEGATIVE
Candida Glabrata: NEGATIVE
Candida Vaginitis: NEGATIVE
Chlamydia: NEGATIVE
Comment: NEGATIVE
Comment: NEGATIVE
Comment: NEGATIVE
Comment: NEGATIVE
Comment: NEGATIVE
Comment: NORMAL
Neisseria Gonorrhea: NEGATIVE
Trichomonas: NEGATIVE

## 2024-07-15 LAB — CBC/D/PLT+RPR+RH+ABO+RUBIGG...
Antibody Screen: NEGATIVE
Basophils Absolute: 0 x10E3/uL (ref 0.0–0.2)
Basos: 0 %
EOS (ABSOLUTE): 0.1 x10E3/uL (ref 0.0–0.4)
Eos: 1 %
HCV Ab: NONREACTIVE
HIV Screen 4th Generation wRfx: NONREACTIVE
Hematocrit: 31.9 % — ABNORMAL LOW (ref 34.0–46.6)
Hemoglobin: 10.6 g/dL — ABNORMAL LOW (ref 11.1–15.9)
Hepatitis B Surface Ag: NEGATIVE
Immature Grans (Abs): 0 x10E3/uL (ref 0.0–0.1)
Immature Granulocytes: 0 %
Lymphocytes Absolute: 1.8 x10E3/uL (ref 0.7–3.1)
Lymphs: 27 %
MCH: 26.2 pg — ABNORMAL LOW (ref 26.6–33.0)
MCHC: 33.2 g/dL (ref 31.5–35.7)
MCV: 79 fL (ref 79–97)
Monocytes Absolute: 0.5 x10E3/uL (ref 0.1–0.9)
Monocytes: 8 %
Neutrophils Absolute: 4.4 x10E3/uL (ref 1.4–7.0)
Neutrophils: 64 %
Platelets: 268 x10E3/uL (ref 150–450)
RBC: 4.05 x10E6/uL (ref 3.77–5.28)
RDW: 14.1 % (ref 11.7–15.4)
RPR Ser Ql: NONREACTIVE
Rh Factor: POSITIVE
Rubella Antibodies, IGG: 0.97 {index} — ABNORMAL LOW (ref >0.99)
WBC: 6.8 x10E3/uL (ref 3.4–10.8)

## 2024-07-15 LAB — COMPREHENSIVE METABOLIC PANEL WITH GFR
ALT: 8 IU/L (ref 0–32)
AST: 12 IU/L (ref 0–40)
Albumin: 4.5 g/dL (ref 3.9–4.9)
Alkaline Phosphatase: 65 IU/L (ref 41–116)
BUN/Creatinine Ratio: 13 (ref 9–23)
BUN: 7 mg/dL (ref 6–20)
Bilirubin Total: 0.5 mg/dL (ref 0.0–1.2)
CO2: 20 mmol/L (ref 20–29)
Calcium: 9.9 mg/dL (ref 8.7–10.2)
Chloride: 102 mmol/L (ref 96–106)
Creatinine, Ser: 0.56 mg/dL — ABNORMAL LOW (ref 0.57–1.00)
Globulin, Total: 2.7 g/dL (ref 1.5–4.5)
Glucose: 79 mg/dL (ref 70–99)
Potassium: 4.1 mmol/L (ref 3.5–5.2)
Sodium: 136 mmol/L (ref 134–144)
Total Protein: 7.2 g/dL (ref 6.0–8.5)
eGFR: 120 mL/min/1.73 (ref >59)

## 2024-07-19 LAB — PANORAMA PRENATAL TEST FULL PANEL:PANORAMA TEST PLUS 5 ADDITIONAL MICRODELETIONS: FETAL FRACTION: 9.1

## 2024-07-20 LAB — CYTOLOGY - PAP
Comment: NEGATIVE
Diagnosis: NEGATIVE
High risk HPV: NEGATIVE

## 2024-08-03 ENCOUNTER — Encounter: Payer: Self-pay | Admitting: Obstetrics & Gynecology

## 2024-08-11 ENCOUNTER — Ambulatory Visit (INDEPENDENT_AMBULATORY_CARE_PROVIDER_SITE_OTHER): Admitting: Obstetrics and Gynecology

## 2024-08-11 ENCOUNTER — Encounter: Payer: Self-pay | Admitting: Obstetrics and Gynecology

## 2024-08-11 VITALS — BP 103/63 | HR 67 | Wt 168.0 lb

## 2024-08-11 DIAGNOSIS — R519 Headache, unspecified: Secondary | ICD-10-CM | POA: Diagnosis not present

## 2024-08-11 DIAGNOSIS — O0992 Supervision of high risk pregnancy, unspecified, second trimester: Secondary | ICD-10-CM | POA: Diagnosis not present

## 2024-08-11 DIAGNOSIS — G609 Hereditary and idiopathic neuropathy, unspecified: Secondary | ICD-10-CM

## 2024-08-11 DIAGNOSIS — Z3A16 16 weeks gestation of pregnancy: Secondary | ICD-10-CM | POA: Diagnosis not present

## 2024-08-11 DIAGNOSIS — O0990 Supervision of high risk pregnancy, unspecified, unspecified trimester: Secondary | ICD-10-CM

## 2024-08-11 DIAGNOSIS — O26892 Other specified pregnancy related conditions, second trimester: Secondary | ICD-10-CM | POA: Diagnosis not present

## 2024-08-11 MED ORDER — MAGNESIUM OXIDE 400 MG PO TABS: 400.0000 mg | ORAL_TABLET | Freq: Every day | ORAL | 1 refills | Status: AC

## 2024-08-11 NOTE — Progress Notes (Signed)
 Pt reports swelling in ankles since Thanksgiving. States it doesn't always go down with rest.   Pt reports headaches for about a month now. She states tylenol  doesn't always help. Denies vision changes and dizziness.   BP today 103/63; on nifedipine  30mg  daily.

## 2024-08-11 NOTE — Progress Notes (Addendum)
° ° °  PRENATAL VISIT NOTE  Subjective:  Melanie Strong is a 38 y.o. [phone] at [redacted]w[redacted]d being seen today for ongoing prenatal care.  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  Patient reports headache.  Contractions: Irritability. Vag. Bleeding: None.  Movement: Present. Denies leaking of fluid.   The following portions of the patient's history were reviewed and updated as appropriate: allergies, current medications, past family history, past medical history, past social history, past surgical history and problem list.   Objective:    Vitals:   08/11/24 0954  BP: 103/63  Pulse: 67  Weight: 76.2 kg    Fetal Status:  Fetal Heart Rate (bpm): 147   Movement: Present    General: Alert, oriented and cooperative. Patient is in no acute distress.  Skin: Skin is warm and dry. No rash noted.   Cardiovascular: Normal heart rate noted  Respiratory: Normal respiratory effort, no problems with respiration noted  Abdomen: Soft, gravid, appropriate for gestational age.  Pain/Pressure: Absent     Pelvic: Cervical exam deferred        Extremities: Normal range of motion.  Edema: Mild pitting, slight indentation  Mental Status: Normal mood and affect. Normal behavior. Normal judgment and thought content.   Assessment and Plan:  Pregnancy: [id number] at [redacted]w[redacted]d   1. Pregnancy headache, antepartum (Primary) -Recommended Excedrin Tension -Mag Oxide 400mg   2. Supervision of high risk pregnancy, antepartum -Doing well, BP, HR, FHR, WNL   Preterm labor symptoms and general obstetric precautions including but not limited to vaginal bleeding,  contractions, leaking of fluid and fetal movement were reviewed in detail with the patient. Please refer to After Visit Summary for other counseling recommendations.     Future Appointments  Date Time Provider Department Center  09/08/2024  1:00 PM [facility] PROVIDER 1 [facility]  09/08/2024  1:30 PM [facility] US3 [facility]  09/08/2024  2:50 PM Daring Nidia LITTIE, FNP [facility] None    Rolin Amel, S-WHNP

## 2024-08-30 DIAGNOSIS — O09529 Supervision of elderly multigravida, unspecified trimester: Secondary | ICD-10-CM | POA: Insufficient documentation

## 2024-08-30 DIAGNOSIS — O10919 Unspecified pre-existing hypertension complicating pregnancy, unspecified trimester: Secondary | ICD-10-CM | POA: Insufficient documentation

## 2024-08-30 DIAGNOSIS — Z98891 History of uterine scar from previous surgery: Secondary | ICD-10-CM | POA: Insufficient documentation

## 2024-09-08 ENCOUNTER — Ambulatory Visit: Attending: Obstetrics and Gynecology

## 2024-09-08 ENCOUNTER — Other Ambulatory Visit: Payer: Self-pay | Admitting: *Deleted

## 2024-09-08 ENCOUNTER — Encounter: Payer: Self-pay | Admitting: Obstetrics and Gynecology

## 2024-09-08 ENCOUNTER — Ambulatory Visit (HOSPITAL_BASED_OUTPATIENT_CLINIC_OR_DEPARTMENT_OTHER): Admitting: Obstetrics

## 2024-09-08 ENCOUNTER — Other Ambulatory Visit: Payer: Self-pay | Admitting: Obstetrics and Gynecology

## 2024-09-08 ENCOUNTER — Ambulatory Visit: Admitting: Obstetrics and Gynecology

## 2024-09-08 VITALS — BP 102/61 | HR 75 | Wt 169.0 lb

## 2024-09-08 VITALS — BP 118/66 | HR 87

## 2024-09-08 DIAGNOSIS — D649 Anemia, unspecified: Secondary | ICD-10-CM | POA: Diagnosis not present

## 2024-09-08 DIAGNOSIS — O09522 Supervision of elderly multigravida, second trimester: Secondary | ICD-10-CM | POA: Insufficient documentation

## 2024-09-08 DIAGNOSIS — O99012 Anemia complicating pregnancy, second trimester: Secondary | ICD-10-CM | POA: Diagnosis not present

## 2024-09-08 DIAGNOSIS — R12 Heartburn: Secondary | ICD-10-CM

## 2024-09-08 DIAGNOSIS — O10012 Pre-existing essential hypertension complicating pregnancy, second trimester: Secondary | ICD-10-CM | POA: Diagnosis not present

## 2024-09-08 DIAGNOSIS — O10919 Unspecified pre-existing hypertension complicating pregnancy, unspecified trimester: Secondary | ICD-10-CM

## 2024-09-08 DIAGNOSIS — D563 Thalassemia minor: Secondary | ICD-10-CM

## 2024-09-08 DIAGNOSIS — Z8632 Personal history of gestational diabetes: Secondary | ICD-10-CM | POA: Diagnosis not present

## 2024-09-08 DIAGNOSIS — Z98891 History of uterine scar from previous surgery: Secondary | ICD-10-CM

## 2024-09-08 DIAGNOSIS — Z3A20 20 weeks gestation of pregnancy: Secondary | ICD-10-CM | POA: Insufficient documentation

## 2024-09-08 DIAGNOSIS — O10912 Unspecified pre-existing hypertension complicating pregnancy, second trimester: Secondary | ICD-10-CM | POA: Diagnosis present

## 2024-09-08 DIAGNOSIS — O09292 Supervision of pregnancy with other poor reproductive or obstetric history, second trimester: Secondary | ICD-10-CM

## 2024-09-08 DIAGNOSIS — O09299 Supervision of pregnancy with other poor reproductive or obstetric history, unspecified trimester: Secondary | ICD-10-CM

## 2024-09-08 DIAGNOSIS — O34219 Maternal care for unspecified type scar from previous cesarean delivery: Secondary | ICD-10-CM | POA: Diagnosis not present

## 2024-09-08 DIAGNOSIS — O0992 Supervision of high risk pregnancy, unspecified, second trimester: Secondary | ICD-10-CM | POA: Diagnosis not present

## 2024-09-08 DIAGNOSIS — O0990 Supervision of high risk pregnancy, unspecified, unspecified trimester: Secondary | ICD-10-CM

## 2024-09-08 DIAGNOSIS — Z3A19 19 weeks gestation of pregnancy: Secondary | ICD-10-CM

## 2024-09-08 DIAGNOSIS — Z148 Genetic carrier of other disease: Secondary | ICD-10-CM

## 2024-09-08 DIAGNOSIS — O09529 Supervision of elderly multigravida, unspecified trimester: Secondary | ICD-10-CM

## 2024-09-08 DIAGNOSIS — Z362 Encounter for other antenatal screening follow-up: Secondary | ICD-10-CM

## 2024-09-08 MED ORDER — FAMOTIDINE 20 MG PO TABS: 20.0000 mg | ORAL_TABLET | Freq: Every day | ORAL | 0 refills | Status: DC

## 2024-09-08 MED ORDER — ASPIRIN 81 MG PO TBEC: 81.0000 mg | DELAYED_RELEASE_TABLET | Freq: Every day | ORAL | 2 refills | Status: AC

## 2024-09-08 NOTE — Progress Notes (Signed)
" ° °  PRENATAL VISIT NOTE  Subjective:  Melanie Strong is a 39 y.o. [phone] at [redacted]w[redacted]d being seen today for ongoing prenatal care.  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  Patient reports hip pain and heartburn tums do not work .  Contractions: Not present. Vag. Bleeding: None.  Movement: Present. Denies leaking of fluid.   The following portions of the patient's history were reviewed and updated as appropriate: allergies, current medications, past family history, past medical history, past social history, past surgical history and problem list.   Objective:   Vitals:   09/08/24 1506  BP: 102/61  Pulse: 75  Weight: 169 lb (76.7 kg)    Fetal Status:  Fetal Heart Rate (bpm): 143   Movement: Present    General: Alert, oriented and cooperative. Patient is in no acute distress.  Skin: Skin is warm and dry. No rash noted.   Cardiovascular: Normal heart rate noted  Respiratory: Normal respiratory effort, no problems with respiration noted  Abdomen: Soft, gravid, appropriate for gestational age.  Pain/Pressure: Present     Pelvic: Cervical exam deferred        Extremities: Normal range of motion.  Edema: Trace  Mental Status: Normal mood and affect. Normal behavior. Normal judgment and thought content.   Assessment and Plan:  Pregnancy: [id number] at [redacted]w[redacted]d 1. Supervision of high risk pregnancy, antepartum (Primary) BP and FHR normal Doing well, feeling regular movement    2. History of 3 cesarean  sections Plan RCS tentatively 39 wks pending clinical course Anatomy u/s today EFW 66%, afi normal   3. Chronic hypertension during pregnancy, antepartum Normotensive  Continue Nifedipine  30mg , start baby ASA, rx sent today  Plan weekly testing at 32 weeks and q4 wk growth u/x   4. History of gestational diabetes in prior pregnancy, currently pregnant A1c 5.6  5. Antepartum multigravida of advanced maternal age Following mfm   6. Alpha thalassemia silent carrier 7. 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 Carrier of spinal muscular atrophy Partner test negative   9. Heartburn Discussed limiting heavy, greasy, spicy foods. Sit up x 20-30 min after meal Trial pepcid   - famotidine  (PEPCID ) 20 MG tablet; Take 1 tablet (20 mg total) by mouth at bedtime.  Dispense: 30 tablet; Refill: 0  10. [redacted] weeks gestation of pregnancy Supportive measures for hip pain to include stretches, maternity belt, tylenol , if worsens can do PT   Preterm labor symptoms and general obstetric precautions including but not limited to vaginal bleeding, contractions, leaking of fluid and fetal movement were reviewed in detail with the patient. Please refer to After Visit Summary for other counseling recommendations.   Return in about 4 weeks (around 10/06/2024) for OB VISIT (MD or APP).  Future Appointments  Date Time Provider Department Center  10/07/2024  1:15 PM [facility] PROVIDER 1 [facility]  10/07/2024  1:30 PM [facility] US2 [facility]  10/08/2024 10:15 AM Constant, [place], MD [facility] None    Nidia Daring, FNP  "

## 2024-09-08 NOTE — Progress Notes (Signed)
 MFM Consult Note  Melanie Strong is currently at [redacted]w[redacted]d. She was seen today for a detailed fetal anatomy scan due to advanced maternal age, short interval pregnancy, and chronic hypertension currently treated with nifedipine .    She has a history of 3 prior cesarean deliveries.    Her last pregnancy was also complicated by gestational diabetes.  Her most recent hemoglobin A1c was 5.6%.  She denies any problems in her current pregnancy.    She had a cell free DNA test earlier in her pregnancy which indicated a low risk for trisomy 64, 75, and 13. A female fetus is predicted.   Sonographic findings Single intrauterine pregnancy at 19w 4d. Fetal cardiac activity:  Observed and appears normal. Presentation: Breech. The views of the fetal anatomy were limited today due to the fetal position.  What was visualized today appeared within normal limits. Fetal biometry shows the estimated fetal weight of 0 lb 11 oz, 321 grams (66%). Amniotic fluid: Within normal limits.  MVP: 3.81 cm. Placenta: Posterior. Adnexa: No abnormality visualized. Cervical length: 3.3 cm.  A normal-appearing posterior placenta was noted on today's exam.  There were no signs of placenta accreta noted.  The patient was informed that anomalies may be missed due to technical limitations. If the fetus is in a suboptimal position or maternal habitus is increased, visualization of the fetus in the maternal uterus may be impaired.  Advanced maternal age  The increased risk of fetal aneuploidy due to advanced maternal age was discussed.   Due to advanced maternal age, the patient was offered and declined an amniocentesis today for definitive diagnosis of fetal aneuploidy. She is comfortable with the low risk indicated by her cell free DNA test.  Chronic Hypertension in Pregnancy  The implications and management of chronic hypertension in pregnancy was discussed.   She was advised to continue taking nifedipine  as prescribed  for the duration of her pregnancy.  The increased risk of superimposed preeclampsia, an indicated preterm delivery, and possible fetal growth restriction due to chronic hypertension in pregnancy was discussed.   We will continue to follow her with monthly growth scans.    Weekly fetal testing should be started at 32 weeks and continued until delivery.  To decrease her risk of superimposed preeclampsia, she should start taking a daily baby aspirin  (81 mg daily) for preeclampsia prophylaxis.   A follow-up exam was scheduled in 4 weeks to assess the fetal growth and to complete the views of the fetal anatomy.  The patient stated that all of her questions were answered.   A total of 30 minutes was spent counseling and coordinating the care for this patient.  Greater than 50% of the time was spent in direct face-to-face contact.

## 2024-09-08 NOTE — Progress Notes (Signed)
 Pt reports left hip pain.   Baby needs baby aspirin  RX.  Pt had anatomy US  today.   Pt states she is experiencing a lot of heartburn

## 2024-09-28 ENCOUNTER — Encounter: Payer: Self-pay | Admitting: Obstetrics & Gynecology

## 2024-09-29 ENCOUNTER — Other Ambulatory Visit: Payer: Self-pay | Admitting: Obstetrics and Gynecology

## 2024-09-29 DIAGNOSIS — M25559 Pain in unspecified hip: Secondary | ICD-10-CM

## 2024-09-29 MED ORDER — CYCLOBENZAPRINE HCL 5 MG PO TABS: 5.0000 mg | ORAL_TABLET | Freq: Every day | ORAL | 0 refills | Status: AC

## 2024-09-29 NOTE — Progress Notes (Signed)
 PT ordered and rx for flexeril  for hip pain

## 2024-10-07 ENCOUNTER — Ambulatory Visit

## 2024-10-07 ENCOUNTER — Ambulatory Visit: Admitting: Obstetrics and Gynecology

## 2024-10-07 ENCOUNTER — Encounter: Payer: Self-pay | Admitting: Obstetrics and Gynecology

## 2024-10-07 VITALS — BP 121/50 | HR 77

## 2024-10-07 VITALS — BP 119/74 | HR 87 | Wt 175.0 lb

## 2024-10-07 DIAGNOSIS — F17210 Nicotine dependence, cigarettes, uncomplicated: Secondary | ICD-10-CM | POA: Diagnosis not present

## 2024-10-07 DIAGNOSIS — O09299 Supervision of pregnancy with other poor reproductive or obstetric history, unspecified trimester: Secondary | ICD-10-CM

## 2024-10-07 DIAGNOSIS — O09522 Supervision of elderly multigravida, second trimester: Secondary | ICD-10-CM

## 2024-10-07 DIAGNOSIS — Z3A23 23 weeks gestation of pregnancy: Secondary | ICD-10-CM

## 2024-10-07 DIAGNOSIS — Z98891 History of uterine scar from previous surgery: Secondary | ICD-10-CM

## 2024-10-07 DIAGNOSIS — O99332 Smoking (tobacco) complicating pregnancy, second trimester: Secondary | ICD-10-CM | POA: Diagnosis not present

## 2024-10-07 DIAGNOSIS — O10012 Pre-existing essential hypertension complicating pregnancy, second trimester: Secondary | ICD-10-CM

## 2024-10-07 DIAGNOSIS — D563 Thalassemia minor: Secondary | ICD-10-CM

## 2024-10-07 DIAGNOSIS — O0990 Supervision of high risk pregnancy, unspecified, unspecified trimester: Secondary | ICD-10-CM | POA: Diagnosis not present

## 2024-10-07 DIAGNOSIS — O10919 Unspecified pre-existing hypertension complicating pregnancy, unspecified trimester: Secondary | ICD-10-CM

## 2024-10-07 DIAGNOSIS — Z362 Encounter for other antenatal screening follow-up: Secondary | ICD-10-CM

## 2024-10-07 NOTE — Progress Notes (Signed)
 "  PRENATAL VISIT NOTE  Subjective:  Melanie Strong is a 39 y.o. [phone] at [redacted]w[redacted]d being seen today for ongoing prenatal care.  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  Patient reports no complaints.  Contractions: Not present.  .  Movement: Present. Denies leaking of fluid.   The following portions of the patient's history were reviewed and updated as appropriate: allergies, current medications, past family history, past medical history, past social history, past surgical history and problem list.   Objective:   Vitals:   10/07/24 1118  BP: 119/74  Pulse: 87  Weight: 175 lb (79.4 kg)    Fetal Status:  Fetal Heart Rate (bpm): 159 Fundal Height: 24 cm Movement: Present    General: Alert, oriented and cooperative. Patient is in no acute distress.  Skin: Skin is warm and dry. No rash noted.   Cardiovascular: Normal heart rate noted  Respiratory: Normal respiratory effort, no problems with respiration noted  Abdomen: Soft, gravid, appropriate for gestational age.  Pain/Pressure: Absent     Pelvic: Cervical exam deferred        Extremities: Normal range of motion.  Edema: Trace  Mental Status: Normal mood and affect. Normal behavior. Normal judgment and thought content.      06/23/2024    9:50 AM 05/26/2024    9:23 AM 11/24/2023    9:52 AM  Depression screen PHQ 2/9  Decreased Interest 0 0 0  Down, Depressed, Hopeless 0 0 1  PHQ - 2 Score 0 0 1  Altered sleeping 0 0 1  Tired, decreased  energy 0 0 0  Change in appetite 0 0 0  Feeling bad or failure about yourself  0 0 0  Trouble concentrating 0 0 0  Moving slowly or fidgety/restless 0 0 0  Suicidal thoughts 0 0 0  PHQ-9 Score 0  0  2   Difficult doing work/chores  Not difficult at all Not difficult at all     Data saved with a previous flowsheet row definition        06/23/2024    9:50 AM 05/26/2024    9:23 AM 11/24/2023    9:52 AM 03/17/2023    8:54 AM  GAD 7 : Generalized Anxiety Score  Nervous, Anxious, on Edge 0  0  0  0   Control/stop worrying 0  0  0  0   Worry too much - different things 0  0  0  0   Trouble relaxing 0  0  0  0   Restless 0  0  0  0   Easily annoyed or irritable 0  0  0  0   Afraid - awful might happen 0  0  0  0   Total GAD 7 Score 0 0 0 0  Anxiety Difficulty  Not difficult at all Not difficult at all      Data saved with a previous flowsheet row definition    Assessment and Plan:  Pregnancy: [id number] at [redacted]w[redacted]d 1. Supervision of high risk pregnancy, antepartum (Primary) Patient is doing well without complaints Third trimester labs and glucola next  visit  2. Chronic hypertension during pregnancy, antepartum Stable on procardia  Continue ASA Follow up growth ultrasound 2/5 pm  3. History of gestational diabetes in prior pregnancy, currently pregnant A1c 5.6 on intake  5. History of 3 cesarean sections Will be scheduled for repeat  Preterm labor symptoms and general obstetric precautions including but not limited to vaginal bleeding, contractions, leaking of fluid and fetal movement were reviewed in detail with the patient. Please refer to After Visit Summary for other counseling recommendations.   Return in about 4 weeks (around 11/04/2024) for in person, ROB, High risk, 2 hr glucola next visit.  Future Appointments  Date Time Provider Department Center  10/07/2024  1:15 PM [facility] PROVIDER 1 [facility]  10/07/2024  1:30 PM [facility] US2 [facility]    Winton Felt, MD  "

## 2024-10-07 NOTE — Progress Notes (Unsigned)
 [id number] [address]

## 2024-10-07 NOTE — Progress Notes (Signed)
 After review, MFM consult with provider is not indicated for today  Arna Ranks, MD 10/07/2024 2:13 PM  [facility]

## 2024-10-08 ENCOUNTER — Encounter: Payer: Self-pay | Admitting: Obstetrics and Gynecology

## 2024-11-02 ENCOUNTER — Encounter: Payer: Self-pay | Admitting: Obstetrics and Gynecology

## 2024-11-02 ENCOUNTER — Other Ambulatory Visit: Payer: Self-pay

## 2024-11-15 ENCOUNTER — Ambulatory Visit
